# Patient Record
Sex: Female | Born: 1945
Health system: Southern US, Community
[De-identification: ages and names within clinical notes are randomized; demographics above are authoritative.]

## PROBLEM LIST (undated history)

## (undated) DIAGNOSIS — R011 Cardiac murmur, unspecified: Secondary | ICD-10-CM

## (undated) DIAGNOSIS — D509 Iron deficiency anemia, unspecified: Secondary | ICD-10-CM

## (undated) DIAGNOSIS — I10 Essential (primary) hypertension: Secondary | ICD-10-CM

## (undated) DIAGNOSIS — H269 Unspecified cataract: Secondary | ICD-10-CM

## (undated) DIAGNOSIS — T7840XA Allergy, unspecified, initial encounter: Secondary | ICD-10-CM

## (undated) DIAGNOSIS — C539 Malignant neoplasm of cervix uteri, unspecified: Secondary | ICD-10-CM

## (undated) DIAGNOSIS — M4313 Spondylolisthesis, cervicothoracic region: Secondary | ICD-10-CM

## (undated) DIAGNOSIS — M542 Cervicalgia: Secondary | ICD-10-CM

## (undated) DIAGNOSIS — Z87410 Personal history of cervical dysplasia: Secondary | ICD-10-CM

## (undated) DIAGNOSIS — G8929 Other chronic pain: Secondary | ICD-10-CM

## (undated) DIAGNOSIS — M1990 Unspecified osteoarthritis, unspecified site: Secondary | ICD-10-CM

## (undated) DIAGNOSIS — R079 Chest pain, unspecified: Secondary | ICD-10-CM

## (undated) DIAGNOSIS — K08109 Complete loss of teeth, unspecified cause, unspecified class: Secondary | ICD-10-CM

## (undated) DIAGNOSIS — Z8619 Personal history of other infectious and parasitic diseases: Secondary | ICD-10-CM

## (undated) DIAGNOSIS — K219 Gastro-esophageal reflux disease without esophagitis: Secondary | ICD-10-CM

## (undated) DIAGNOSIS — H04123 Dry eye syndrome of bilateral lacrimal glands: Secondary | ICD-10-CM

## (undated) DIAGNOSIS — N183 Chronic kidney disease, stage 3 unspecified: Secondary | ICD-10-CM

## (undated) DIAGNOSIS — I70213 Atherosclerosis of native arteries of extremities with intermittent claudication, bilateral legs: Secondary | ICD-10-CM

## (undated) DIAGNOSIS — M47812 Spondylosis without myelopathy or radiculopathy, cervical region: Secondary | ICD-10-CM

## (undated) DIAGNOSIS — Z86010 Personal history of colonic polyps: Secondary | ICD-10-CM

## (undated) DIAGNOSIS — N950 Postmenopausal bleeding: Secondary | ICD-10-CM

## (undated) DIAGNOSIS — E785 Hyperlipidemia, unspecified: Secondary | ICD-10-CM

## (undated) HISTORY — PX: HEMORRHOID SURGERY: SHX153

## (undated) HISTORY — DX: Chest pain, unspecified: R07.9

## (undated) HISTORY — PX: ORIF METATARSAL FRACTURE: SUR942

## (undated) HISTORY — DX: Essential (primary) hypertension: I10

## (undated) HISTORY — DX: Personal history of colonic polyps: Z86.010

## (undated) HISTORY — DX: Allergy, unspecified, initial encounter: T78.40XA

## (undated) HISTORY — DX: Dry eye syndrome of bilateral lacrimal glands: H04.123

## (undated) HISTORY — DX: Spondylosis without myelopathy or radiculopathy, cervical region: M47.812

## (undated) HISTORY — DX: Unspecified cataract: H26.9

## (undated) HISTORY — DX: Unspecified osteoarthritis, unspecified site: M19.90

## (undated) HISTORY — PX: FOOT SURGERY: SHX648

## (undated) HISTORY — DX: Gastro-esophageal reflux disease without esophagitis: K21.9

## (undated) HISTORY — PX: TUBAL LIGATION: SHX77

---

## 1999-03-20 ENCOUNTER — Encounter: Payer: Self-pay | Admitting: Pulmonary Disease

## 1999-03-20 ENCOUNTER — Ambulatory Visit (HOSPITAL_COMMUNITY): Admission: RE | Admit: 1999-03-20 | Discharge: 1999-03-20 | Payer: Self-pay | Admitting: Pulmonary Disease

## 2005-08-03 ENCOUNTER — Ambulatory Visit: Payer: Self-pay | Admitting: Nurse Practitioner

## 2005-08-03 ENCOUNTER — Ambulatory Visit: Payer: Self-pay | Admitting: *Deleted

## 2005-08-05 ENCOUNTER — Ambulatory Visit (HOSPITAL_COMMUNITY): Admission: RE | Admit: 2005-08-05 | Discharge: 2005-08-05 | Payer: Self-pay | Admitting: Nurse Practitioner

## 2005-08-19 ENCOUNTER — Ambulatory Visit: Payer: Self-pay | Admitting: Nurse Practitioner

## 2005-08-27 ENCOUNTER — Ambulatory Visit (HOSPITAL_COMMUNITY): Admission: RE | Admit: 2005-08-27 | Discharge: 2005-08-27 | Payer: Self-pay | Admitting: Internal Medicine

## 2006-08-23 ENCOUNTER — Emergency Department (HOSPITAL_COMMUNITY): Admission: EM | Admit: 2006-08-23 | Discharge: 2006-08-23 | Payer: Self-pay | Admitting: Emergency Medicine

## 2006-09-05 ENCOUNTER — Ambulatory Visit: Payer: Self-pay | Admitting: Nurse Practitioner

## 2006-09-08 ENCOUNTER — Ambulatory Visit (HOSPITAL_COMMUNITY): Admission: RE | Admit: 2006-09-08 | Discharge: 2006-09-08 | Payer: Self-pay | Admitting: Nurse Practitioner

## 2006-09-08 ENCOUNTER — Ambulatory Visit (HOSPITAL_COMMUNITY): Admission: RE | Admit: 2006-09-08 | Discharge: 2006-09-08 | Payer: Self-pay | Admitting: Family Medicine

## 2007-03-13 ENCOUNTER — Ambulatory Visit: Payer: Self-pay | Admitting: Nurse Practitioner

## 2007-09-06 ENCOUNTER — Encounter (INDEPENDENT_AMBULATORY_CARE_PROVIDER_SITE_OTHER): Payer: Self-pay | Admitting: *Deleted

## 2007-09-15 ENCOUNTER — Ambulatory Visit (HOSPITAL_COMMUNITY): Admission: RE | Admit: 2007-09-15 | Discharge: 2007-09-15 | Payer: Self-pay | Admitting: Nurse Practitioner

## 2008-06-12 ENCOUNTER — Ambulatory Visit: Payer: Self-pay | Admitting: Obstetrics and Gynecology

## 2008-06-12 ENCOUNTER — Other Ambulatory Visit: Admission: RE | Admit: 2008-06-12 | Discharge: 2008-06-12 | Payer: Self-pay | Admitting: Obstetrics and Gynecology

## 2011-05-04 NOTE — Group Therapy Note (Signed)
NAME:  Anita [name], Anita [name] NO.:  [id number]   MEDICAL RECORD NO.:  [id number]          PATIENT TYPE:  WOC   LOCATION:  [facility]                   FACILITY:  [facility]   PHYSICIAN:  Argentina Donovan, MD        DATE OF BIRTH:  09-26-1946   DATE OF SERVICE:  06/11/2008                                  CLINIC NOTE   The patient is a 65 year old African American female referred from the  [facility] Department after she had a colposcopy showing cervical biopsy of  low grade squamous intraepithelial lesion associated with detached  fragments concerning for high-grade squamous intraepithelial lesion, in  the endocervical curettings detached fragments of dysplastic squamous  mucosa.  The patient came for evaluation and examination.  The cervix  was atrophic and flat against the apex of the vagina but looked  completely normal.  I took a brush and did an endometrial smear and  endocervical smear, and if that does not show any atypical cells, I  think that I would go ahead and do cryosurgery on this patient and then  repeat her Pap smear in 6 months.  If, however, it shows positive cells  in the endocervical canal, I think that a LEEP procedure would be  indicated.   IMPRESSION:  Atypical cervical cytology L SIL, possible high-grade   I will call the patient with the results when they come in.           ______________________________  Argentina Donovan, MD     PR/MEDQ  D:  06/12/2008  T:  06/12/2008  Job:  [id number]

## 2011-06-04 ENCOUNTER — Emergency Department (HOSPITAL_COMMUNITY): Payer: Self-pay

## 2011-06-04 ENCOUNTER — Observation Stay (HOSPITAL_COMMUNITY)
Admission: EM | Admit: 2011-06-04 | Discharge: 2011-06-05 | Disposition: A | Discharge status: Home / Self Care | Payer: Self-pay | Attending: Orthopedic Surgery | Admitting: Orthopedic Surgery

## 2011-06-04 DIAGNOSIS — Y92009 Unspecified place in unspecified non-institutional (private) residence as the place of occurrence of the external cause: Secondary | ICD-10-CM | POA: Insufficient documentation

## 2011-06-04 DIAGNOSIS — Y998 Other external cause status: Secondary | ICD-10-CM | POA: Insufficient documentation

## 2011-06-04 DIAGNOSIS — Z01812 Encounter for preprocedural laboratory examination: Secondary | ICD-10-CM | POA: Insufficient documentation

## 2011-06-04 DIAGNOSIS — X500XXA Overexertion from strenuous movement or load, initial encounter: Secondary | ICD-10-CM | POA: Insufficient documentation

## 2011-06-04 DIAGNOSIS — Y9301 Activity, walking, marching and hiking: Secondary | ICD-10-CM | POA: Insufficient documentation

## 2011-06-04 DIAGNOSIS — S92309A Fracture of unspecified metatarsal bone(s), unspecified foot, initial encounter for closed fracture: Principal | ICD-10-CM | POA: Insufficient documentation

## 2011-06-04 LAB — CBC
HCT: 31.4 % — ABNORMAL LOW (ref 36.0–46.0)
MCH: 31.5 pg (ref 26.0–34.0)
MCV: 85.3 fL (ref 78.0–100.0)
Platelets: 174 10*3/uL (ref 150–400)
RDW: 18.5 % — ABNORMAL HIGH (ref 11.5–15.5)

## 2011-06-04 LAB — BASIC METABOLIC PANEL
BUN: 11 mg/dL (ref 6–23)
Calcium: 9.3 mg/dL (ref 8.4–10.5)
Creatinine, Ser: 0.54 mg/dL (ref 0.50–1.10)
GFR calc Af Amer: >60 mL/min (ref >60)

## 2011-06-10 NOTE — H&P (Signed)
Anita, [name] NO.:  [id number]  MEDICAL RECORD NO.:  [id number]  LOCATION:  5024                         FACILITY:  [facility]  PHYSICIAN:  Toni Arthurs, MD        DATE OF BIRTH:  [birth year]  DATE OF ADMISSION:  06/04/2011 DATE OF DISCHARGE:  06/04/2011                             HISTORY & PHYSICAL   ADMISSION DIAGNOSIS:  Left foot Lisfranc injury, left second, third, fourth and fifth metatarsal fractures.  HISTORY OF PRESENT ILLNESS:  The patient is a 65 year old woman with a past medical history significant for cigarette smoking.  She twisted her foot on a gumball while walking 2 days ago.  She was able to bear weight up through yesterday and then said the pain and swelling in her foot had become too severe.  She says she has been keeping it elevated and staying off her foot.  She denies any previous injury or surgery to that foot in the past.  She complains of severe pain in the mid foot that is worse with any attempt at standing and feels better when she stays off of it and keeps it elevated.  She is not diabetic and is not hypothyroid.  PAST MEDICAL HISTORY:  None.  PAST SURGICAL HISTORY:  Hemorrhoidectomy.  SOCIAL HISTORY:  The patient lives with her mother.  She is unemployed. She smokes a pack of cigarettes a day.  She denies any alcohol or drug use.  FAMILY HISTORY:  Positive for coronary artery disease in her mother and brother.  Hypertension in several family members.  REVIEW OF SYSTEMS:  No recent fever, chills, nausea, vomiting or changes in her appetite.  Review of systems as above and otherwise negative.  PHYSICAL EXAM:  The patient is a thin, generally healthy-appearing woman, in no apparent distress.  She is alert and oriented x4.  Mood and affect normal.  Extraocular motions are intact.  Respirations are unlabored.  Her gait is nonweightbearing on the left lower extremity. The left foot is somewhat swollen, but the skin still  wrinkles. Dorsalis pedis and posterior tibial pulses are 2+.  She feels light touch normally in the deep, superficial, peroneal nerve distributions, as well as the sural and saphenous nerve distributions, and the medial and lateral plantar nerve distributions.  She has tenderness to palpation over the midfoot.  There is no gross deformity noted.  The foot has active strength in dorsiflexion and plantar flexion of the ankle and the toes.  The foot has no lymphadenopathy.  X-RAYS:  Three views in the left foot were obtained.  These show displaced fractures of the third and fifth metatarsal shafts.  The second and fourth metatarsal bases have nondisplaced fractures that are extra-articular.  The first tarsometatarsal joint has slight displacement with the metatarsal base translated laterally.  There are small avulsion fractures adjacent to the joint capsule at the base of the first metatarsal medially and laterally.  There is no evidence of placement at the Lisfranc joint.  ASSESSMENT: 1. Left foot Lisfranc injury. 2. Left second metatarsal shaft fracture. 3. Left third metatarsal shaft fracture. 4. Left fourth metatarsal shaft fracture. 5. Left fifth metatarsal shaft fracture.  PLAN:  I explained the nature of these injuries to the patient in detail.  Based on the pattern of injury and the apparent instability at the first tarsometatarsal joint, I believe this is best treated operatively.  I explained the risks and benefits of nonoperative treatment as well as operative treatment in detail to the patient and her family members.  Their questions were answered to their satisfaction.  She understands specifically risks of bleeding, infection, nerve damage, blood clots, need for additional surgery, amputation, and death.  She would not proceed with surgical treatment. This will include closed reduction, percutaneous pinning, versus ORIF of her third and fifth metatarsal.  Her first  tarsometatarsal joint rather will be stressed.  If it can be reduced, closed, and will treat it closed otherwise, she will need ORIF of the first tarsometatarsal joint.     Toni Arthurs, MD     JH/MEDQ  D:  06/04/2011  T:  06/05/2011  Job:  [id number]  Electronically Signed by Toni Arthurs  on 06/10/2011 12:17:10 PM

## 2011-06-10 NOTE — Op Note (Signed)
Anita [name], Anita [name] NO.:  [id number]  MEDICAL RECORD NO.:  [id number]  LOCATION:  5024                         FACILITY:  [facility]  PHYSICIAN:  Toni Arthurs, MD        DATE OF BIRTH:  06-May-1946  DATE OF PROCEDURE:  06/04/2011 DATE OF DISCHARGE:  06/04/2011                              OPERATIVE REPORT   PREOPERATIVE DIAGNOSIS:  Left foot, first through fifth metatarsal fractures, possible Lisfranc fracture dislocation.  POSTOPERATIVE DIAGNOSIS:  Left foot, first through fifth metatarsal fractures.  PROCEDURE: 1. Open reduction and internal fixation of left foot third and fifth     metatarsal shaft fractures. 2. Intraoperative stress examination of the left foot under     fluoroscopy. 3. Intraoperative interpretation of fluoroscopic images. 4. Closed treatment of left foot first, second, and fourth metatarsal     fractures.  SURGEON:  Toni Arthurs, MD  ANESTHESIA:  General.  IV FLUIDS:  See anesthesia record.  ESTIMATED BLOOD LOSS:  Minimal.  TOURNIQUET TIME:  43 minutes at 250 mmHg.  COMPLICATIONS:  None apparent.  DISPOSITION:  Extubated, awake and stable to recovery.  INDICATIONS FOR PROCEDURE:  The patient is a 65 year old woman who sustained a twisting injury to her left foot approximately 3 days prior to admission.  Her x-rays showed displaced metatarsal shaft fractures #3 and #5.  She was also noted to have fractures of the base of the first, second, and fourth metatarsals.  This injury pattern was suggestive of a Lisfranc fracture dislocation.  She presents now for closed reduction and percutaneous pinning versus open reduction internal fixation of her third and fifth displaced metatarsal shaft fractures, and stress examination of her foot under fluoroscopy.  She may need open treatment of her Lisfranc fracture dislocation.  She understands the risks and benefits of this procedure and would like to proceed.  She also understands the risks  and benefits of the alternative treatment options. She specifically understands risks of bleeding, infection, nerve damage, blood clots, need for additional surgery, amputation and death.  PROCEDURE IN DETAIL:  After preoperative consent was obtained and the correct operative site was identified, the patient was brought to the operating room and placed supine on the operating table.  General anesthesia was induced.  Preoperative antibiotics were administered. Surgical time-out was taken, left lower extremity was prepped and draped in standard sterile fashion with the tourniquet around the thigh.  The patient's fifth metatarsal shaft fracture was identified.  A 0.0625 K- wire was inserted through the sole of foot at the metatarsal head holding the toe dorsiflexed.  The pin was advanced up the shaft of the distal fragment.  Several attempts at closed reduction were made under fluoroscopic guidance.  These were unsuccessful in reducing the fracture.  A longitudinal incision was made over the fracture site. Blunt dissection was carried down through the subcutaneous tissue.  The fracture site was identified.  The fracture was reduced.  The pin was driven across the fracture site and into the base of the fifth metatarsal.  AP and lateral views showed appropriate reduction of fracture and appropriate position of the pin.  This was repeated for the third metatarsal.  The  AP fluoroscopic image was then obtained. Adduction and abduction stress were then applied under live fluoroscopy. The Lisfranc joint was noted to be stable.  A lateral x-ray was obtained.  Plantar flexion stress was then applied and again, the Lisfranc joint was noted to be stable.  Both pins were then bent and trimmed.  Final AP, lateral, and oblique films had been obtained showing appropriate reduction of both fractures and appropriate position and length of the pins.  The wounds were irrigated copiously.  Inverted simple  sutures of 3-0 Monocryl were used to close subcutaneous tissue. Vertical mattress sutures of 4-0 nylon were used close the skin incision.  Sterile dressings were applied followed by a well-padded short-leg splint.  The tourniquet was released at 43 minutes.  The patient was then awakened by anesthesia and transported to the recovery room in stable condition.  FOLLOWUP PLAN:  The patient will be observed overnight for pain control. She will be discharged to home.  She will follow up with me in 2 weeks for suture removal and conversion to a cast.     Toni Arthurs, MD     JH/MEDQ  D:  06/04/2011  T:  06/05/2011  Job:  [id number]  Electronically Signed by Jonny Ruiz Lennart Gladish  on 06/10/2011 12:17:22 PM

## 2014-08-20 ENCOUNTER — Ambulatory Visit: Payer: Medicare HMO | Admitting: Physician Assistant

## 2014-10-04 ENCOUNTER — Emergency Department (INDEPENDENT_AMBULATORY_CARE_PROVIDER_SITE_OTHER)
Admission: EM | Admit: 2014-10-04 | Discharge: 2014-10-04 | Disposition: A | Discharge status: Home / Self Care | Payer: Medicare HMO | Source: Home / Self Care | Attending: Family Medicine | Admitting: Family Medicine

## 2014-10-04 ENCOUNTER — Encounter (HOSPITAL_COMMUNITY): Payer: Self-pay | Admitting: Emergency Medicine

## 2014-10-04 DIAGNOSIS — G8929 Other chronic pain: Secondary | ICD-10-CM

## 2014-10-04 DIAGNOSIS — M549 Dorsalgia, unspecified: Secondary | ICD-10-CM

## 2014-10-04 MED ORDER — DICLOFENAC SODIUM 50 MG PO TBEC: 50.0000 mg | DELAYED_RELEASE_TABLET | Freq: Two times a day (BID) | ORAL | Status: DC

## 2014-10-04 NOTE — ED Provider Notes (Signed)
CSN: [id number]     Arrival date & time 10/04/14  1226 History   None    Chief Complaint  Patient presents with  . Back Pain   (Consider location/radiation/quality/duration/timing/severity/associated sxs/prior Treatment) HPI Comments: Patient presents with a one year history of chronic, intermittent thoracic back pain. State current exacerbation is worse on the right side of her back. States pain increased 3 days ago. Denies recent illness or injury. Is a smoker. Denies bowel or bladder issues. Denies changes in strength or sensation. Using Aleve at home with some relief. No rash. PCP: Dr. Doug Sou at [facility]  Patient is a 68 y.o. female presenting with back pain. The history is provided by the patient.  Back Pain   History reviewed. No pertinent past medical history. History reviewed. No pertinent past surgical history. No family history on file. History  Substance Use Topics  . Smoking status: Current Every Day Smoker -- 1.00 packs/day    Types: Cigarettes  . Smokeless tobacco: Not on file  . Alcohol Use: Yes   OB History   Grav Para Term Preterm Abortions TAB SAB Ect Mult Living                 Review of Systems  Musculoskeletal: Positive for back pain.  All other systems reviewed and are negative.   Allergies  Review of patient's allergies indicates no known allergies.  Home Medications   Prior to Admission medications   Medication Sig Start Date End Date Taking? Authorizing Provider  diclofenac (VOLTAREN) 50 MG EC tablet Take 1 tablet (50 mg total) by mouth 2 (two) times daily. As needed for pain 10/04/14   Annett Gula H Iaan Oregel, PA   BP 145/82  Pulse 76  Temp(Src) 97.7 F (36.5 C) (Oral)  Resp 14  SpO2 99% Physical Exam  Nursing note and vitals reviewed. Constitutional: She is oriented to person, place, and time. She appears well-developed and well-nourished.  HENT:  Head: Normocephalic and atraumatic.  Eyes: Conjunctivae are normal. No scleral  icterus.  Cardiovascular: Normal rate, regular rhythm and normal heart sounds.   Pulmonary/Chest: Effort normal and breath sounds normal. No respiratory distress. She has no wheezes.  Abdominal: Soft. Normal appearance and bowel sounds are normal. She exhibits no distension. There is no tenderness. There is no rigidity, no rebound, no guarding and no CVA tenderness.  Musculoskeletal: Normal range of motion. She exhibits no edema.       Thoracic back: She exhibits tenderness. She exhibits normal range of motion, no bony tenderness, no swelling, no edema, no deformity, no laceration, no pain, no spasm and normal pulse.  +moderate kyphosis of thoracic spine without bony midline tenderness +mild tenderness to palapation of soft tissues near lower pole of scapula without visible skin changes or deformity  Neurological: She is alert and oriented to person, place, and time.  Skin: Skin is warm and dry. No rash noted. No erythema.  Psychiatric: She has a normal mood and affect. Her behavior is normal.    ED Course  Procedures (including critical care time) Labs Review Labs Reviewed - No data to display  Imaging Review No results found.   MDM   1. Chronic back pain   Mild acute exacerbation of chronic thoracic back pain I suspect is related to moderate thoracic kyphosis. Exam without suggestion of compression fracture or acute neurovascular or neuromuscular deficit. Switch to Voltaren as directed and follow up with PCP or ortho if symptoms worsen.     Annett Gula  Renelda Loma, PA 10/05/14 1308

## 2014-10-04 NOTE — Discharge Instructions (Signed)

## 2014-10-04 NOTE — ED Notes (Signed)
C/o upper back pain onset 2 days States it "feels like muscle spasm"; d/c increases w/activity Denies inj/trauma Alert, no signs of acute distress.

## 2014-10-09 NOTE — ED Provider Notes (Signed)
Medical screening examination/treatment/procedure(s) were performed by resident physician or non-physician practitioner and as supervising physician I was immediately available for consultation/collaboration.   Trust Leh DOUGLAS MD.   Emmons Toth D Yamen Castrogiovanni, MD 10/09/14 2004 

## 2014-12-06 ENCOUNTER — Encounter: Payer: Self-pay | Admitting: Internal Medicine

## 2014-12-06 ENCOUNTER — Other Ambulatory Visit (INDEPENDENT_AMBULATORY_CARE_PROVIDER_SITE_OTHER): Payer: Medicare HMO

## 2014-12-06 ENCOUNTER — Ambulatory Visit (INDEPENDENT_AMBULATORY_CARE_PROVIDER_SITE_OTHER): Payer: Medicare HMO | Admitting: Internal Medicine

## 2014-12-06 ENCOUNTER — Ambulatory Visit (INDEPENDENT_AMBULATORY_CARE_PROVIDER_SITE_OTHER): Payer: Medicare HMO | Admitting: Geriatric Medicine

## 2014-12-06 VITALS — BP 160/84 | HR 96 | Temp 98.4°F | Resp 20 | Ht 62.0 in | Wt 120.8 lb

## 2014-12-06 DIAGNOSIS — Z23 Encounter for immunization: Secondary | ICD-10-CM

## 2014-12-06 DIAGNOSIS — Z0000 Encounter for general adult medical examination without abnormal findings: Secondary | ICD-10-CM | POA: Insufficient documentation

## 2014-12-06 DIAGNOSIS — Z1239 Encounter for other screening for malignant neoplasm of breast: Secondary | ICD-10-CM

## 2014-12-06 DIAGNOSIS — Z1211 Encounter for screening for malignant neoplasm of colon: Secondary | ICD-10-CM

## 2014-12-06 DIAGNOSIS — Z418 Encounter for other procedures for purposes other than remedying health state: Secondary | ICD-10-CM

## 2014-12-06 DIAGNOSIS — M549 Dorsalgia, unspecified: Secondary | ICD-10-CM | POA: Insufficient documentation

## 2014-12-06 DIAGNOSIS — M545 Low back pain, unspecified: Secondary | ICD-10-CM

## 2014-12-06 DIAGNOSIS — Z299 Encounter for prophylactic measures, unspecified: Secondary | ICD-10-CM

## 2014-12-06 LAB — COMPREHENSIVE METABOLIC PANEL
ALBUMIN: 3.7 g/dL (ref 3.5–5.2)
ALK PHOS: 73 U/L (ref 39–117)
ALT: 38 U/L — ABNORMAL HIGH (ref 0–35)
AST: 60 U/L — ABNORMAL HIGH (ref 0–37)
BUN: 19 mg/dL (ref 6–23)
CO2: 22 mEq/L (ref 19–32)
Calcium: 9.3 mg/dL (ref 8.4–10.5)
Chloride: 105 mEq/L (ref 96–112)
Creatinine, Ser: 0.9 mg/dL (ref 0.4–1.2)
GFR: 85.98 mL/min (ref >60.00)
GLUCOSE: 80 mg/dL (ref 70–99)
POTASSIUM: 3.5 meq/L (ref 3.5–5.1)
SODIUM: 137 meq/L (ref 135–145)
TOTAL PROTEIN: 8.1 g/dL (ref 6.0–8.3)
Total Bilirubin: 0.5 mg/dL (ref 0.2–1.2)

## 2014-12-06 LAB — LIPID PANEL
CHOL/HDL RATIO: 3
CHOLESTEROL: 213 mg/dL — AB (ref 0–200)
HDL: 71.2 mg/dL (ref >39.00)
LDL CALC: 117 mg/dL — AB (ref 0–99)
NonHDL: 141.8
Triglycerides: 124 mg/dL (ref 0.0–149.0)
VLDL: 24.8 mg/dL (ref 0.0–40.0)

## 2014-12-06 LAB — HEMOGLOBIN A1C: Hgb A1c MFr Bld: 4.7 % (ref 4.6–6.5)

## 2014-12-06 NOTE — Progress Notes (Signed)
Pre visit review using our clinic review tool, if applicable. No additional management support is needed unless otherwise documented below in the visit note. 

## 2014-12-06 NOTE — Assessment & Plan Note (Signed)
Doing well with NSAIDs and likely muscular in origin. No further workup indicated at this time.

## 2014-12-06 NOTE — Assessment & Plan Note (Signed)
Needs mammogram, colonoscopy. Order and referral placed today. Flu and pneumonia shot given today. Will give Tdap next time.

## 2014-12-06 NOTE — Progress Notes (Signed)
   Subjective:    Patient ID: Anita [name], female    DOB: December 07, 1946, 68 y.o.   MRN: [id number]  HPI The patient is a 68 year old woman who comes in today to establish care. She does have no known past medical history. She has been seen at the ER several times in the last several months for low back pain. She was given Voltaren which she states has helped moderately. She denies any family medical history. She has not been to see a doctor in some time. Her mammogram is not up to date, she's never had colon cancer screening.  Review of Systems  Constitutional: Negative for fever, activity change, appetite change, fatigue and unexpected weight change.  HENT: Negative.   Respiratory: Negative for cough, chest tightness, shortness of breath and wheezing.   Cardiovascular: Negative for chest pain, palpitations and leg swelling.  Gastrointestinal: Negative for nausea, abdominal pain, diarrhea, constipation and abdominal distention.  Musculoskeletal: Negative.   Skin: Negative.   Neurological: Negative.   Psychiatric/Behavioral: Negative.       Objective:   Physical Exam  Constitutional: She is oriented to person, place, and time. She appears well-developed and well-nourished.  HENT:  Head: Normocephalic and atraumatic.  Eyes: EOM are normal.  Neck: Normal range of motion.  Cardiovascular: Normal rate and regular rhythm.   Pulmonary/Chest: Effort normal and breath sounds normal. No respiratory distress. She has no wheezes. She has no rales.  Abdominal: Soft. Bowel sounds are normal. She exhibits no distension. There is no tenderness. There is no rebound.  Musculoskeletal: She exhibits no edema.  Neurological: She is alert and oriented to person, place, and time. Coordination normal.  Skin: Skin is warm and dry.   Filed Vitals:   12/06/14 0907  BP: 160/84  Pulse: 96  Temp: 98.4 F (36.9 C)  TempSrc: Oral  Resp: 20  Height: 5\' 2"  (1.575 m)  Weight: 120 lb 12.8 oz (54.795 kg)    SpO2: 99%      Assessment & Plan:

## 2014-12-06 NOTE — Patient Instructions (Signed)
We will send you for your mammogram as well as your colon cancer screening. You should hear back about both those things to get them scheduled.  We will check your blood work today and call you back with the results.  We would recommend that you stop smoking. Every day that you are still smoking your damaging your lungs. It is never too late to quit.  We will see you back next year for your physical. If you have any new problems or questions before then please feel free to call our office.  Colonoscopy A colonoscopy is an exam to look at your colon. This exam can help find lumps (tumors), growths (polyps), bleeding, and redness and puffiness (inflammation) in your colon.  BEFORE THE PROCEDURE  Ask your doctor about changing or stopping your regular medicines.  You may need to drink a large amount of a special liquid (oral bowel prep). You start drinking this the day before your procedure. It will cause you to have watery poop (stool). This cleans out your colon.  Do not eat or drink anything else once you have started the bowel prep, unless your doctor tells you it is safe to do so.  Make plans for someone to drive you home after the procedure. PROCEDURE  You will be given medicine to help you relax (sedative).  You will lie on your side with your knees bent.  A tube with a camera on the end is put in the opening of your butt (anus) and into your colon. Pictures are sent to a computer screen. Your doctor will look for anything that is not normal.  Your doctor may take a tissue sample (biopsy) from your colon to be looked at more closely.  The exam is finished when your doctor has viewed all of the colon. AFTER THE PROCEDURE  Do not drive for 24 hours after the exam.  You may have a small amount of blood in your poop. This is normal.  You may pass gas and have belly (abdominal) cramps. This is normal.  Ask when your test results will be ready. Make sure you get your test  results. Document Released: 01/08/2011 Document Revised: 12/11/2013 Document Reviewed: 08/13/2013 [facility] Patient Information 2015 [place], [place]. This information is not intended to replace advice given to you by your health care provider. Make sure you discuss any questions you have with your health care provider.   Smoking Cessation Quitting smoking is important to your health and has many advantages. However, it is not always easy to quit since nicotine is a very addictive drug. Oftentimes, people try 3 times or more before being able to quit. This document explains the best ways for you to prepare to quit smoking. Quitting takes hard work and a lot of effort, but you can do it. ADVANTAGES OF QUITTING SMOKING  You will live longer, feel better, and live better.  Your body will feel the impact of quitting smoking almost immediately.  Within 20 minutes, blood pressure decreases. Your pulse returns to its normal level.  After 8 hours, carbon monoxide levels in the blood return to normal. Your oxygen level increases.  After 24 hours, the chance of having a heart attack starts to decrease. Your breath, hair, and body stop smelling like smoke.  After 48 hours, damaged nerve endings begin to recover. Your sense of taste and smell improve.  After 72 hours, the body is virtually free of nicotine. Your bronchial tubes relax and breathing becomes easier.  After  2 to 12 weeks, lungs can hold more air. Exercise becomes easier and circulation improves.  The risk of having a heart attack, stroke, cancer, or lung disease is greatly reduced.  After 1 year, the risk of coronary heart disease is cut in half.  After 5 years, the risk of stroke falls to the same as a nonsmoker.  After 10 years, the risk of lung cancer is cut in half and the risk of other cancers decreases significantly.  After 15 years, the risk of coronary heart disease drops, usually to the level of a nonsmoker.  If you are  pregnant, quitting smoking will improve your chances of having a healthy baby.  The people you live with, especially any children, will be healthier.  You will have extra money to spend on things other than cigarettes. QUESTIONS TO THINK ABOUT BEFORE ATTEMPTING TO QUIT You may want to talk about your answers with your health care provider.  Why do you want to quit?  If you tried to quit in the past, what helped and what did not?  What will be the most difficult situations for you after you quit? How will you plan to handle them?  Who can help you through the tough times? Your family? Friends? A health care provider?  What pleasures do you get from smoking? What ways can you still get pleasure if you quit? Here are some questions to ask your health care provider:  How can you help me to be successful at quitting?  What medicine do you think would be best for me and how should I take it?  What should I do if I need more help?  What is smoking withdrawal like? How can I get information on withdrawal? GET READY  Set a quit date.  Change your environment by getting rid of all cigarettes, ashtrays, matches, and lighters in your home, car, or work. Do not let people smoke in your home.  Review your past attempts to quit. Think about what worked and what did not. GET SUPPORT AND ENCOURAGEMENT You have a better chance of being successful if you have help. You can get support in many ways.  Tell your family, friends, and coworkers that you are going to quit and need their support. Ask them not to smoke around you.  Get individual, group, or telephone counseling and support. Programs are available at [employer] and health centers. Call your local health department for information about programs in your area.  Spiritual beliefs and practices may help some smokers quit.  Download a "quit meter" on your computer to keep track of quit statistics, such as how long you have gone without  smoking, cigarettes not smoked, and money saved.  Get a self-help book about quitting smoking and staying off tobacco. [place] yourself from urges to smoke. Talk to someone, go for a walk, or occupy your time with a task.  Change your normal routine. Take a different route to work. Drink tea instead of coffee. Eat breakfast in a different place.  Reduce your stress. Take a hot bath, exercise, or read a book.  Plan something enjoyable to do every day. Reward yourself for not smoking.  Explore interactive web-based programs that specialize in helping you quit. GET MEDICINE AND USE IT CORRECTLY Medicines can help you stop smoking and decrease the urge to smoke. Combining medicine with the above behavioral methods and support can greatly increase your chances of successfully quitting smoking.  Nicotine replacement therapy helps deliver nicotine to your body without the negative effects and risks of smoking. Nicotine replacement therapy includes nicotine gum, lozenges, inhalers, nasal sprays, and skin patches. Some may be available over-the-counter and others require a prescription.  Antidepressant medicine helps people abstain from smoking, but how this works is unknown. This medicine is available by prescription.  Nicotinic receptor partial agonist medicine simulates the effect of nicotine in your brain. This medicine is available by prescription. Ask your health care provider for advice about which medicines to use and how to use them based on your health history. Your health care provider will tell you what side effects to look out for if you choose to be on a medicine or therapy. Carefully read the information on the package. Do not use any other product containing nicotine while using a nicotine replacement product.  RELAPSE OR DIFFICULT SITUATIONS Most relapses occur within the first 3 months after quitting. Do not be discouraged if you start smoking again.  Remember, most people try several times before finally quitting. You may have symptoms of withdrawal because your body is used to nicotine. You may crave cigarettes, be irritable, feel very hungry, cough often, get headaches, or have difficulty concentrating. The withdrawal symptoms are only temporary. They are strongest when you first quit, but they will go away within 10-14 days. To reduce the chances of relapse, try to:  Avoid drinking alcohol. Drinking lowers your chances of successfully quitting.  Reduce the amount of caffeine you consume. Once you quit smoking, the amount of caffeine in your body increases and can give you symptoms, such as a rapid heartbeat, sweating, and anxiety.  Avoid smokers because they can make you want to smoke.  Do not let weight gain distract you. Many smokers will gain weight when they quit, usually less than 10 pounds. Eat a healthy diet and stay active. You can always lose the weight gained after you quit.  Find ways to improve your mood other than smoking. FOR MORE INFORMATION  www.smokefree.gov  Document Released: 11/30/2001 Document Revised: 04/22/2014 Document Reviewed: 03/16/2012 [facility] Patient Information 2015 [place], [place]. This information is not intended to replace advice given to you by your health care provider. Make sure you discuss any questions you have with your health care provider.

## 2014-12-23 ENCOUNTER — Encounter: Payer: Self-pay | Admitting: Internal Medicine

## 2014-12-24 ENCOUNTER — Ambulatory Visit
Admission: RE | Admit: 2014-12-24 | Discharge: 2014-12-24 | Disposition: A | Discharge status: Home / Self Care | Payer: Medicare HMO | Source: Ambulatory Visit | Attending: Internal Medicine | Admitting: Internal Medicine

## 2014-12-24 DIAGNOSIS — Z1239 Encounter for other screening for malignant neoplasm of breast: Secondary | ICD-10-CM

## 2014-12-24 DIAGNOSIS — Z1231 Encounter for screening mammogram for malignant neoplasm of breast: Secondary | ICD-10-CM | POA: Diagnosis not present

## 2014-12-26 ENCOUNTER — Other Ambulatory Visit: Payer: Self-pay | Admitting: Internal Medicine

## 2014-12-26 DIAGNOSIS — R928 Other abnormal and inconclusive findings on diagnostic imaging of breast: Secondary | ICD-10-CM

## 2015-01-03 ENCOUNTER — Ambulatory Visit
Admission: RE | Admit: 2015-01-03 | Discharge: 2015-01-03 | Disposition: A | Discharge status: Home / Self Care | Payer: Medicare HMO | Source: Ambulatory Visit | Attending: Internal Medicine | Admitting: Internal Medicine

## 2015-01-03 DIAGNOSIS — R920 Mammographic microcalcification found on diagnostic imaging of breast: Secondary | ICD-10-CM | POA: Diagnosis not present

## 2015-01-03 DIAGNOSIS — R928 Other abnormal and inconclusive findings on diagnostic imaging of breast: Secondary | ICD-10-CM

## 2015-01-21 ENCOUNTER — Encounter: Payer: Self-pay | Admitting: Internal Medicine

## 2015-01-21 ENCOUNTER — Ambulatory Visit (INDEPENDENT_AMBULATORY_CARE_PROVIDER_SITE_OTHER): Payer: Commercial Managed Care - HMO | Admitting: Internal Medicine

## 2015-01-21 VITALS — BP 130/78 | HR 96 | Temp 97.9°F | Resp 12 | Ht 62.0 in | Wt 120.0 lb

## 2015-01-21 DIAGNOSIS — M545 Low back pain, unspecified: Secondary | ICD-10-CM

## 2015-01-21 MED ORDER — CYCLOBENZAPRINE HCL 5 MG PO TABS: 5.0000 mg | ORAL_TABLET | Freq: Two times a day (BID) | ORAL | Status: DC | PRN

## 2015-01-21 NOTE — Progress Notes (Signed)
Pre visit review using our clinic review tool, if applicable. No additional management support is needed unless otherwise documented below in the visit note. 

## 2015-01-21 NOTE — Patient Instructions (Signed)
We have sent in a strong muscle relaxer for your back which you can take 1 pill up to 2 times a day.   The other thing we think you should try is a heating pad on the back which may help a lot more.   Back Pain, Adult Back pain is very common. The pain often gets better over time. The cause of back pain is usually not dangerous. Most people can learn to manage their back pain on their own.  HOME CARE   Stay active. Start with short walks on flat ground if you can. Try to walk farther each day.  Do not sit, drive, or stand in one place for more than 30 minutes. Do not stay in bed.  Do not avoid exercise or work. Activity can help your back heal faster.  Be careful when you bend or lift an object. Bend at your knees, keep the object close to you, and do not twist.  Sleep on a firm mattress. Lie on your side, and bend your knees. If you lie on your back, put a pillow under your knees.  Only take medicines as told by your doctor.  Put ice on the injured area.  Put ice in a plastic bag.  Place a towel between your skin and the bag.  Leave the ice on for 15-20 minutes, 03-04 times a day for the first 2 to 3 days. After that, you can switch between ice and heat packs.  Ask your doctor about back exercises or massage.  Avoid feeling anxious or stressed. Find good ways to deal with stress, such as exercise. GET HELP RIGHT AWAY IF:   Your pain does not go away with rest or medicine.  Your pain does not go away in 1 week.  You have new problems.  You do not feel well.  The pain spreads into your legs.  You cannot control when you poop (bowel movement) or pee (urinate).  Your arms or legs feel weak or lose feeling (numbness).  You feel sick to your stomach (nauseous) or throw up (vomit).  You have belly (abdominal) pain.  You feel like you may pass out (faint). MAKE SURE YOU:   Understand these instructions.  Will watch your condition.  Will get help right away if you  are not doing well or get worse. Document Released: 05/24/2008 Document Revised: 02/28/2012 Document Reviewed: 04/09/2014 [facility] Patient Information 2015 [place], [place]. This information is not intended to replace advice given to you by your health care provider. Make sure you discuss any questions you have with your health care provider.

## 2015-01-23 NOTE — Progress Notes (Signed)
   Subjective:    Patient ID: Anita [name], female    DOB: [birth year], 69 y.o.   MRN: [id number]  HPI The patient is a 69 YO female who is coming in for back pain. It has been going on for 3 days and is constant throbbing pain. She denies injury or strain to the area. She has tried tylenol and ibuprofen which were not helpful. No associated numbness or pain in her legs. No nausea, fever, weight loss.   Review of Systems  Constitutional: Negative for fever, activity change, appetite change, fatigue and unexpected weight change.  HENT: Negative.   Respiratory: Negative for cough, chest tightness, shortness of breath and wheezing.   Cardiovascular: Negative for chest pain, palpitations and leg swelling.  Gastrointestinal: Negative for nausea, abdominal pain, diarrhea, constipation and abdominal distention.  Musculoskeletal: Positive for back pain.  Skin: Negative.   Neurological: Negative.   Psychiatric/Behavioral: Negative.       Objective:   Physical Exam  Constitutional: She is oriented to person, place, and time. She appears well-developed and well-nourished.  HENT:  Head: Normocephalic and atraumatic.  Eyes: EOM are normal.  Neck: Normal range of motion.  Cardiovascular: Normal rate and regular rhythm.   Pulmonary/Chest: Effort normal and breath sounds normal. No respiratory distress. She has no wheezes. She has no rales.  Abdominal: Soft. Bowel sounds are normal. She exhibits no distension. There is no tenderness. There is no rebound.  Musculoskeletal: She exhibits no edema.  Low back tenderness left paraspinal  Neurological: She is alert and oriented to person, place, and time. Coordination normal.  Skin: Skin is warm and dry.   Filed Vitals:   01/21/15 1503  BP: 130/78  Pulse: 96  Temp: 97.9 F (36.6 C)  TempSrc: Oral  Resp: 12  Height: 5\' 2"  (1.575 m)  Weight: 120 lb (54.432 kg)  SpO2: 99%      Assessment & Plan:

## 2015-01-23 NOTE — Assessment & Plan Note (Signed)
Muscular in origin and not doing well with NSAIDs last 3 days. Likely strain. Given stretching exercises and will trial flexeril for the pain. No indication for imaging at this time.

## 2015-02-13 ENCOUNTER — Ambulatory Visit (AMBULATORY_SURGERY_CENTER): Payer: Self-pay | Admitting: *Deleted

## 2015-02-13 VITALS — Ht 62.0 in | Wt 122.0 lb

## 2015-02-13 DIAGNOSIS — Z1211 Encounter for screening for malignant neoplasm of colon: Secondary | ICD-10-CM

## 2015-02-13 NOTE — Progress Notes (Signed)
No egg or soy allergy.  No home 02 use No diet pills No issues with past sedation emmi video to email

## 2015-02-18 HISTORY — PX: COLONOSCOPY: SHX174

## 2015-02-27 ENCOUNTER — Ambulatory Visit (AMBULATORY_SURGERY_CENTER): Payer: Commercial Managed Care - HMO | Admitting: Internal Medicine

## 2015-02-27 ENCOUNTER — Encounter: Payer: Self-pay | Admitting: Internal Medicine

## 2015-02-27 VITALS — BP 133/63 | HR 86 | Temp 98.1°F | Resp 20 | Ht 62.0 in | Wt 122.0 lb

## 2015-02-27 DIAGNOSIS — D128 Benign neoplasm of rectum: Secondary | ICD-10-CM

## 2015-02-27 DIAGNOSIS — K635 Polyp of colon: Secondary | ICD-10-CM | POA: Diagnosis not present

## 2015-02-27 DIAGNOSIS — K621 Rectal polyp: Secondary | ICD-10-CM

## 2015-02-27 DIAGNOSIS — Z1211 Encounter for screening for malignant neoplasm of colon: Secondary | ICD-10-CM

## 2015-02-27 DIAGNOSIS — D124 Benign neoplasm of descending colon: Secondary | ICD-10-CM | POA: Diagnosis not present

## 2015-02-27 DIAGNOSIS — D125 Benign neoplasm of sigmoid colon: Secondary | ICD-10-CM

## 2015-02-27 MED ORDER — SODIUM CHLORIDE 0.9 % IV SOLN: 500.0000 mL | INTRAVENOUS | Status: DC

## 2015-02-27 NOTE — Progress Notes (Signed)
Called to room to assist during endoscopic procedure.  Patient ID and intended procedure confirmed with present staff. Received instructions for my participation in the procedure from the performing physician.  

## 2015-02-27 NOTE — Patient Instructions (Addendum)
I found and removed 6 polyps today. They were small and appeared benign.  I appreciate the opportunity to care for you.  Gatha Mayer, MD, FACG      YOU HAD AN ENDOSCOPIC PROCEDURE TODAY AT [facility]:   Refer to the procedure report that was given to you for any specific questions about what was found during the examination.  If the procedure report does not answer your questions, please call your gastroenterologist to clarify.  If you requested that your care partner not be given the details of your procedure findings, then the procedure report has been included in a sealed envelope for you to review at your convenience later.  YOU SHOULD EXPECT: Some feelings of bloating in the abdomen. Passage of more gas than usual.  Walking can help get rid of the air that was put into your GI tract during the procedure and reduce the bloating. If you had a lower endoscopy (such as a colonoscopy or flexible sigmoidoscopy) you may notice spotting of blood in your stool or on the toilet paper. If you underwent a bowel prep for your procedure, you may not have a normal bowel movement for a few days.  Please Note:  You might notice some irritation and congestion in your nose or some drainage.  This is from the oxygen used during your procedure.  There is no need for concern and it should clear up in a day or so.  SYMPTOMS TO REPORT IMMEDIATELY:   Following lower endoscopy (colonoscopy or flexible sigmoidoscopy):  Excessive amounts of blood in the stool  Significant tenderness or worsening of abdominal pains  Swelling of the abdomen that is new, acute  Fever of 100F or higher    For urgent or emergent issues, a gastroenterologist can be reached at any hour by calling [phone].   DIET: Your first meal following the procedure should be a small meal and then it is ok to progress to your normal diet. Heavy or fried foods are harder to digest and may make you feel  nauseous or bloated.  Likewise, meals heavy in dairy and vegetables can increase bloating.  Drink plenty of fluids but you should avoid alcoholic beverages for 24 hours.  ACTIVITY:  You should plan to take it easy for the rest of today and you should NOT DRIVE or use heavy machinery until tomorrow (because of the sedation medicines used during the test).    FOLLOW UP: Our staff will call the number listed on your records the next business day following your procedure to check on you and address any questions or concerns that you may have regarding the information given to you following your procedure. If we do not reach you, we will leave a message.  However, if you are feeling well and you are not experiencing any problems, there is no need to return our call.  We will assume that you have returned to your regular daily activities without incident.  If any biopsies were taken you will be contacted by phone or by letter within the next 1-3 weeks.  Please call us at [phone] if you have not heard about the biopsies in 3 weeks.    SIGNATURES/CONFIDENTIALITY: You and/or your care partner have signed paperwork which will be entered into your electronic medical record.  These signatures attest to the fact that that the information above on your After Visit Summary has been reviewed and is understood.  Full responsibility of the confidentiality  of this discharge information lies with you and/or your care-partner.   Resume medications.Information given on polyps with discharge instructions.

## 2015-02-27 NOTE — Op Note (Addendum)
[place]  [address] [place], [address]   COLONOSCOPY PROCEDURE REPORT  PATIENT: Anita [name], Anita [name]  MR#: [id number] BIRTHDATE: 1946/10/06 , 4  yrs. old GENDER: female ENDOSCOPIST: Gatha Mayer, MD, [facility] PROCEDURE DATE:  02/27/2015 PROCEDURE:   Colonoscopy, screening and Colonoscopy with snare polypectomy First Screening Colonoscopy - Avg.  risk and is 50 yrs.  old or older Yes.  Prior Negative Screening - Now for repeat screening. N/A  History of Adenoma - Now for follow-up colonoscopy & has been > or = to 3 yrs.  N/A  Polyps Removed Today ASA CLASS:   Class II INDICATIONS:Screening for colonic neoplasia and Average Risk. MEDICATIONS: Propofol 200 mg IV and Monitored anesthesia care  DESCRIPTION OF PROCEDURE:   After the risks benefits and alternatives of the procedure were thoroughly explained, informed consent was obtained.  The digital rectal exam revealed no rectal mass and revealed decreased sphincter tone.   The LB PCF Q180 [id number]  endoscope was introduced through the anus and advanced to the cecum, which was identified by both the appendix and ileocecal valve. No adverse events experienced.   The quality of the prep was good.  (MiraLax was used)  The instrument was then slowly withdrawn as the colon was fully examined.  COLON FINDINGS: Six sessile polyps ranging from 2 to 63mm in size were found in the rectum (4), sigmoid colon (1), and descending colon(1).  Polypectomies were performed with a cold snare.  The resection was complete, the polyp tissue was partially retrieved and sent to histology. 1 rectal and the sigmoid polyps not recovered.  The examination was otherwise normal.  Retroflexed views revealed no abnormalities. The time to cecum = 4.1 Withdrawal time = 16.8   The scope was withdrawn and the procedure completed. COMPLICATIONS: There were no immediate complications.  ENDOSCOPIC IMPRESSION: 1.   Six sessile polyps ranging from 2 to  51mm in size were found in the rectum, sigmoid colon, and descending colon; polypectomies were performed with a cold snare 1 rectal and the sigmoid polyp not recovered. 2.   The examination was otherwise normal  RECOMMENDATIONS: Timing of repeat colonoscopy will be determined by pathology findings.  eSigned:  Gatha Mayer, MD, [facility] 02/27/2015 9:12 AM Revised: 02/27/2015 9:12 AM  cc: The Patient and Vertell Novak, MD   PATIENT NAME:  Anita [name], Anita [name] MR#: [id number]

## 2015-02-27 NOTE — Progress Notes (Signed)
  [facility] Post-op Note  Patient: Anita [name]  Procedure(s) Performed: colonoscopy  Patient Location: [facility] - Recovery Area  Anesthesia Type: Deep Sedation/Propofol  Level of Consciousness: awake, oriented and patient cooperative  Airway and Oxygen Therapy: Patient Spontanous Breathing  Post-op Pain: none  Post-op Assessment:  Post-op Vital signs reviewed, Patient's Cardiovascular Status Stable, Respiratory Function Stable, Patent Airway, No signs of Nausea or vomiting and Pain level controlled  Post-op Vital Signs: Reviewed and stable  Complications: No apparent anesthesia complications  Amnah Breuer E 9:04 AM

## 2015-02-27 NOTE — Progress Notes (Signed)
Pt. Waiting for ride to arrive.

## 2015-02-28 ENCOUNTER — Telehealth: Payer: Self-pay | Admitting: *Deleted

## 2015-02-28 NOTE — Telephone Encounter (Signed)
  Follow up Call-  Call back number 02/27/2015  Post procedure Call Back phone  # [phone]  Permission to leave phone message Yes     Patient questions:  Do you have a fever, pain , or abdominal swelling? No. Pain Score  0 *  Have you tolerated food without any problems? Yes.    Have you been able to return to your normal activities? Yes.    Do you have any questions about your discharge instructions: Diet   No. Medications  No. Follow up visit  No.  Do you have questions or concerns about your Care? No.  Actions: * If pain score is 4 or above: No action needed, pain <4.

## 2015-03-06 ENCOUNTER — Encounter: Payer: Self-pay | Admitting: Internal Medicine

## 2015-03-06 DIAGNOSIS — Z86010 Personal history of colonic polyps: Secondary | ICD-10-CM

## 2015-03-06 DIAGNOSIS — Z860101 Personal history of adenomatous and serrated colon polyps: Secondary | ICD-10-CM

## 2015-03-06 HISTORY — DX: Personal history of colonic polyps: Z86.010

## 2015-03-06 HISTORY — DX: Personal history of adenomatous and serrated colon polyps: Z86.0101

## 2015-03-06 NOTE — Progress Notes (Signed)
Quick Note:  1 diminutive adenoma and distal hyperplastics Repeat colonoscopy 2021 ______

## 2015-04-02 ENCOUNTER — Other Ambulatory Visit: Payer: Self-pay | Admitting: Internal Medicine

## 2015-04-02 DIAGNOSIS — Z0000 Encounter for general adult medical examination without abnormal findings: Secondary | ICD-10-CM

## 2015-05-05 ENCOUNTER — Telehealth: Payer: Self-pay | Admitting: Internal Medicine

## 2015-05-05 DIAGNOSIS — Z0000 Encounter for general adult medical examination without abnormal findings: Secondary | ICD-10-CM

## 2015-05-05 NOTE — Telephone Encounter (Signed)
Pt called in and wanted to see if Dr Doug Sou would put in order to get a Dexa scan.    Best number [phone]

## 2015-05-05 NOTE — Telephone Encounter (Signed)
Patient aware that orders have been placed.

## 2015-05-05 NOTE — Telephone Encounter (Signed)
Order placed and she will hear back about scheduling.

## 2015-05-06 ENCOUNTER — Telehealth: Payer: Self-pay | Admitting: Internal Medicine

## 2015-05-06 NOTE — Telephone Encounter (Signed)
Patient needs referral for her catarac surgery. Dr. Corky Downs [facility]. Appt. Is for 7/19.

## 2015-05-13 NOTE — Telephone Encounter (Signed)
Anita [name] Anita [name]  #[id number] valid 04/07/2015 - 10/04/2015 for 6 visits

## 2015-06-04 ENCOUNTER — Ambulatory Visit (INDEPENDENT_AMBULATORY_CARE_PROVIDER_SITE_OTHER)
Admission: RE | Admit: 2015-06-04 | Discharge: 2015-06-04 | Disposition: A | Discharge status: Home / Self Care | Payer: Commercial Managed Care - HMO | Source: Ambulatory Visit | Attending: Internal Medicine | Admitting: Internal Medicine

## 2015-06-04 DIAGNOSIS — Z0000 Encounter for general adult medical examination without abnormal findings: Secondary | ICD-10-CM

## 2015-06-04 DIAGNOSIS — Z13820 Encounter for screening for osteoporosis: Secondary | ICD-10-CM | POA: Diagnosis not present

## 2015-06-10 ENCOUNTER — Telehealth: Payer: Self-pay | Admitting: Internal Medicine

## 2015-06-10 NOTE — Telephone Encounter (Signed)
Patient is returning your phone call. 

## 2015-06-11 NOTE — Telephone Encounter (Signed)
Spoke with patient.

## 2015-06-21 ENCOUNTER — Ambulatory Visit (INDEPENDENT_AMBULATORY_CARE_PROVIDER_SITE_OTHER): Payer: Commercial Managed Care - HMO | Admitting: Family Medicine

## 2015-06-21 ENCOUNTER — Encounter: Payer: Self-pay | Admitting: Family Medicine

## 2015-06-21 VITALS — BP 122/72 | HR 83 | Temp 97.8°F | Ht 62.0 in | Wt 122.2 lb

## 2015-06-21 DIAGNOSIS — M79604 Pain in right leg: Secondary | ICD-10-CM

## 2015-06-21 NOTE — Patient Instructions (Signed)
I would schedule tylenol 650 mg three times a day with meals and continue heating and elevation.   This may not be anything serious but you have an abnormal knee and hip exam and believe you need further evaluation.

## 2015-06-21 NOTE — Progress Notes (Signed)
PCP: Olga Millers, MD  Subjective:  Anita [name] is a 69 y.o. year old very pleasant female patient who presents right knee pain radiatingto upper inner thigh -States he had an old injury- tripped on water at work in mid 66s.Sh feels some stiffness with walking in knee and hip. Pain sitting at 2/10, walking 2/10, sit to stand 10/10. Only using a heating pad to ease pain. Has not tried anything other than hitting pad-worried about side effects  ROS- no fever, chills, no bulge in groin  Pertinent Past Medical History- history low back pain, smoker  Medications- reviewed, no rx  Objective: BP 122/72 mmHg  Pulse 83  Temp(Src) 97.8 F (36.6 C) (Oral)  Ht 5\' 2"  (1.575 m)  Wt 122 lb 4 oz (55.452 kg)  BMI 22.35 kg/m2  SpO2 99% Gen: NAD, resting comfortably CV: RRR no murmurs rubs or gallops Lungs: CTAB no crackles, wheeze, rhonchi  Knee: Normal to inspection with no erythema or effusion or obvious bony abnormalities. Palpation normal with no warmth, joint line tenderness, patellar tenderness, or condyle tenderness. ROM full in flexion and extension and lower leg rotation. Ligaments with solid consistent endpoints including ACL, PCL.  Some laxiity with lateral movements of lower leg.. When moving lower leg from medial to lateral position there is a recurrent clunking sensation and patient has mild to moderate pain with this.  Patient tenses too much for me to perform Mcmurrays despite repeated attempts Patella remains in normal tract with these movements.   Right Hip: ROM- limited external rotation of hip, normal internal rotation when compared with left Low back without pain to palpation  Ext: no edema   Assessment/Plan:  Right leg pain Knee with very atypical exam when moving from stressing LCL to MCL with a recurrent clunk sensation, also limited range of motion with external rotation of the hip. Her symptoms for the most part are mild but given abnormal exam, refer  to orthopedics for further evaluation. Would not be surprised if this was a lateral meniscus injury but on other hand, pain is on medial side of knee and up medial inner leg. Does have some pain into groin and may have some hip arthritis as well.   Return precautions advised.   Orders Placed This Encounter  Procedures  . AMB referral to orthopedics    Referral Priority:  Routine    Referral Type:  Consultation    Number of Visits Requested:  1

## 2015-06-21 NOTE — Progress Notes (Signed)
Pre visit review using our clinic review tool, if applicable. No additional management support is needed unless otherwise documented below in the visit note. 

## 2015-07-08 DIAGNOSIS — H2511 Age-related nuclear cataract, right eye: Secondary | ICD-10-CM | POA: Diagnosis not present

## 2015-07-08 DIAGNOSIS — H25011 Cortical age-related cataract, right eye: Secondary | ICD-10-CM | POA: Diagnosis not present

## 2015-07-08 DIAGNOSIS — H2512 Age-related nuclear cataract, left eye: Secondary | ICD-10-CM | POA: Diagnosis not present

## 2015-07-08 DIAGNOSIS — H25012 Cortical age-related cataract, left eye: Secondary | ICD-10-CM | POA: Diagnosis not present

## 2015-07-11 ENCOUNTER — Encounter: Payer: Self-pay | Admitting: Internal Medicine

## 2015-07-11 DIAGNOSIS — R928 Other abnormal and inconclusive findings on diagnostic imaging of breast: Secondary | ICD-10-CM

## 2015-08-07 ENCOUNTER — Encounter: Payer: Self-pay | Admitting: Internal Medicine

## 2015-08-07 MED ORDER — METHOCARBAMOL 500 MG PO TABS: 500.0000 mg | ORAL_TABLET | Freq: Three times a day (TID) | ORAL | Status: DC | PRN

## 2015-08-08 ENCOUNTER — Telehealth: Payer: Self-pay | Admitting: *Deleted

## 2015-08-08 MED ORDER — METHOCARBAMOL 500 MG PO TABS: 500.0000 mg | ORAL_TABLET | Freq: Three times a day (TID) | ORAL | Status: DC | PRN

## 2015-08-08 NOTE — Telephone Encounter (Signed)
Received call pt states md was suppose to send some medication in for her on yesterday pharmacy states they haven't receive. Inform ot md sent robaxin to [facility]. Pt states she ask for med to go to [address]. Inform pt will resend to [facility].../lmb

## 2015-08-18 DIAGNOSIS — M1711 Unilateral primary osteoarthritis, right knee: Secondary | ICD-10-CM | POA: Diagnosis not present

## 2015-08-18 DIAGNOSIS — M47816 Spondylosis without myelopathy or radiculopathy, lumbar region: Secondary | ICD-10-CM | POA: Diagnosis not present

## 2015-08-18 DIAGNOSIS — M160 Bilateral primary osteoarthritis of hip: Secondary | ICD-10-CM | POA: Diagnosis not present

## 2015-09-16 ENCOUNTER — Ambulatory Visit
Admission: RE | Admit: 2015-09-16 | Discharge: 2015-09-16 | Disposition: A | Discharge status: Home / Self Care | Payer: Commercial Managed Care - HMO | Source: Ambulatory Visit | Attending: Internal Medicine | Admitting: Internal Medicine

## 2015-09-16 DIAGNOSIS — R921 Mammographic calcification found on diagnostic imaging of breast: Secondary | ICD-10-CM | POA: Diagnosis not present

## 2015-09-16 DIAGNOSIS — R928 Other abnormal and inconclusive findings on diagnostic imaging of breast: Secondary | ICD-10-CM

## 2015-10-02 ENCOUNTER — Telehealth: Payer: Self-pay | Admitting: Internal Medicine

## 2015-10-02 DIAGNOSIS — H269 Unspecified cataract: Secondary | ICD-10-CM

## 2015-10-02 NOTE — Telephone Encounter (Signed)
Call and find out the name of the doctor for the referral

## 2015-10-02 NOTE — Telephone Encounter (Signed)
Referral placed to optho.

## 2015-10-02 NOTE — Telephone Encounter (Signed)
Pt called back and said that she does have specific doctor, just who is ever that is in network with [facility] and will pay for it. Please help

## 2015-10-02 NOTE — Telephone Encounter (Signed)
Left message for patient to call back  

## 2015-10-02 NOTE — Telephone Encounter (Signed)
Pt request referral to cataracts surgeon. she went to the optamology and they refer her out to the get cataracts surgery but it will be cheaper if Dr. Sharlet Salina put in the referral due to [facility]. Please advise.

## 2015-10-30 ENCOUNTER — Encounter: Payer: Self-pay | Admitting: Internal Medicine

## 2015-11-21 DIAGNOSIS — Z0100 Encounter for examination of eyes and vision without abnormal findings: Secondary | ICD-10-CM | POA: Diagnosis not present

## 2015-11-21 DIAGNOSIS — H25013 Cortical age-related cataract, bilateral: Secondary | ICD-10-CM | POA: Diagnosis not present

## 2015-11-21 DIAGNOSIS — H2513 Age-related nuclear cataract, bilateral: Secondary | ICD-10-CM | POA: Diagnosis not present

## 2015-11-26 ENCOUNTER — Encounter: Payer: Self-pay | Admitting: Internal Medicine

## 2015-11-26 DIAGNOSIS — M549 Dorsalgia, unspecified: Secondary | ICD-10-CM

## 2015-12-03 DIAGNOSIS — H25811 Combined forms of age-related cataract, right eye: Secondary | ICD-10-CM | POA: Diagnosis not present

## 2015-12-03 DIAGNOSIS — H25011 Cortical age-related cataract, right eye: Secondary | ICD-10-CM | POA: Diagnosis not present

## 2015-12-23 ENCOUNTER — Ambulatory Visit (INDEPENDENT_AMBULATORY_CARE_PROVIDER_SITE_OTHER): Payer: Commercial Managed Care - HMO | Admitting: Neurology

## 2015-12-23 ENCOUNTER — Encounter: Payer: Self-pay | Admitting: Neurology

## 2015-12-23 VITALS — BP 137/68 | HR 104 | Ht 62.0 in | Wt 119.0 lb

## 2015-12-23 DIAGNOSIS — M25512 Pain in left shoulder: Secondary | ICD-10-CM | POA: Diagnosis not present

## 2015-12-23 DIAGNOSIS — M25511 Pain in right shoulder: Secondary | ICD-10-CM

## 2015-12-23 DIAGNOSIS — M25519 Pain in unspecified shoulder: Secondary | ICD-10-CM | POA: Insufficient documentation

## 2015-12-23 MED ORDER — TIZANIDINE HCL 2 MG PO TABS: 2.0000 mg | ORAL_TABLET | Freq: Three times a day (TID) | ORAL | Status: DC | PRN

## 2015-12-23 NOTE — Progress Notes (Signed)
PATIENT: Anita [name] DOB: January 01, 1946  Chief Complaint  Patient presents with  . Thoracic Pain    Pain has been present for years but has recently worsened.  She tried muscle relaxers but they were not helpful.  Reports pain, tightness and burning in her scapular region on both sides.  Denies radiation to arms.  She has previously had an xray and she was told it was arthritis.   She has never had a MRI.     HISTORICAL  Anita [name] is a 70 years old right-handed female, seen in refer by  her primary care physician Dr. Pricilla Holm for evaluation of thoracic pain.  She complains of gradual onset intermittent bilateral shoulder pain, usually triggered by using her arms, her shoulder pain, intermittently involving her right, and left shoulder, located at upper trapezius level, not radiating to arms, no neck pain, no limitations on her activity when that happens, it happened every few months, it can last for couple days, she has tried a heating pad without helping her symptoms, she denies gait difficulty, denies bilateral upper extremity paresthesia or weakness.  She denies neck pain, no bowel and bladder incontinence   REVIEW OF SYSTEMS: Full 14 system review of systems performed and notable only for as above  ALLERGIES: No Known Allergies  HOME MEDICATIONS: Current Outpatient Prescriptions  Medication Sig Dispense Refill  . prednisoLONE acetate (PRED FORTE) 1 % ophthalmic suspension USE AS DIRECTED BY DOCTOR AFTER SURGERY  0   No current facility-administered medications for this visit.    PAST MEDICAL HISTORY: Past Medical History  Diagnosis Date  . Allergy   . Dry eyes   . Arthritis   . Cataract     bilateral  . Hx of adenomatous polyp of colon 03/06/2015    PAST SURGICAL HISTORY: Past Surgical History  Procedure Laterality Date  . Hemorrhoid surgery    . Tubal ligation    . Foot surgery      FAMILY HISTORY: Family History  Problem Relation Age of  Onset  . Hypertension Mother   . Arthritis Mother   . Colon cancer Neg Hx   . Esophageal cancer Neg Hx   . Rectal cancer Neg Hx   . Stomach cancer Neg Hx     SOCIAL HISTORY:  Social History   Social History  . Marital Status: Single    Spouse Name: N/A  . Number of Children: 2  . Years of Education: 12+   Occupational History  . Retired    Social History Main Topics  . Smoking status: Current Every Day Smoker -- 1.00 packs/day    Types: Cigarettes  . Smokeless tobacco: Never Used  . Alcohol Use: 0.0 oz/week    0 Standard drinks or equivalent per week     Comment: socially  . Drug Use: No  . Sexual Activity: Not on file   Other Topics Concern  . Not on file   Social History Narrative   Lives at home with her mother.   Right-handed.   Several sodas per day.     PHYSICAL EXAM   Filed Vitals:   12/23/15 0908  BP: 137/68  Pulse: 104  Height: 5\' 2"  (1.575 m)  Weight: 119 lb (53.978 kg)    Not recorded      Body mass index is 21.76 kg/(m^2).  PHYSICAL EXAMNIATION:  Gen: NAD, conversant, well nourised, obese, well groomed  Cardiovascular: Regular rate rhythm, no peripheral edema, warm, nontender. Eyes: Conjunctivae clear without exudates or hemorrhage Neck: Supple, no carotid bruise. Pulmonary: Clear to auscultation bilaterally   NEUROLOGICAL EXAM:  MENTAL STATUS: Speech:    Speech is normal; fluent and spontaneous with normal comprehension.  Cognition:     Orientation to time, place and person     Normal recent and remote memory     Normal Attention span and concentration     Normal Language, naming, repeating,spontaneous speech     Fund of knowledge   CRANIAL NERVES: CN II: Visual fields are full to confrontation. Fundoscopic exam is normal with sharp discs and no vascular changes. Pupils are round equal and briskly reactive to light. CN III, IV, VI: extraocular movement are normal. No ptosis. CN V: Facial sensation is  intact to pinprick in all 3 divisions bilaterally. Corneal responses are intact.  CN VII: Face is symmetric with normal eye closure and smile. CN VIII: Hearing is normal to rubbing fingers CN IX, X: Palate elevates symmetrically. Phonation is normal. CN XI: Head turning and shoulder shrug are intact CN XII: Tongue is midline with normal movements and no atrophy.  MOTOR: There is no pronator drift of out-stretched arms. Muscle bulk and tone are normal. Muscle strength is normal.  REFLEXES: Reflexes are 2+ and symmetric at the biceps, triceps, knees, and ankles. Plantar responses are flexor.  SENSORY: Intact to light touch, pinprick, position sense, and vibration sense are intact in fingers and toes.  COORDINATION: Rapid alternating movements and fine finger movements are intact. There is no dysmetria on finger-to-nose and heel-knee-shin.    GAIT/STANCE: Posture is normal. Gait is steady with normal steps, base, arm swing, and turning. Heel and toe walking are normal. Tandem gait is normal.  Romberg is absent.   DIAGNOSTIC DATA (LABS, IMAGING, TESTING) - I reviewed patient records, labs, notes, testing and imaging myself where available.   ASSESSMENT AND PLAN  Anita [name] is a 70 y.o. female    Intermittent bilateral shoulder pain, normal neurological examinations  Most consistent with musculoskeletal etiology  Tizanidine 2 mg 3 times a day as needed  Return to clinic for new issues  Marcial Pacas, M.D. Ph.D.  [facility] [address] [place], [place] [address] Ph: [phone] Fax: [phone]  CC: [name], MD

## 2016-01-08 DIAGNOSIS — H43811 Vitreous degeneration, right eye: Secondary | ICD-10-CM | POA: Diagnosis not present

## 2016-02-23 DIAGNOSIS — H43811 Vitreous degeneration, right eye: Secondary | ICD-10-CM | POA: Diagnosis not present

## 2016-06-02 ENCOUNTER — Ambulatory Visit: Payer: Self-pay | Admitting: Internal Medicine

## 2016-06-08 ENCOUNTER — Encounter: Payer: Self-pay | Admitting: Internal Medicine

## 2016-06-08 ENCOUNTER — Ambulatory Visit (INDEPENDENT_AMBULATORY_CARE_PROVIDER_SITE_OTHER)
Admission: RE | Admit: 2016-06-08 | Discharge: 2016-06-08 | Disposition: A | Discharge status: Home / Self Care | Payer: Commercial Managed Care - HMO | Source: Ambulatory Visit | Attending: Internal Medicine | Admitting: Internal Medicine

## 2016-06-08 ENCOUNTER — Other Ambulatory Visit: Payer: Self-pay | Admitting: Internal Medicine

## 2016-06-08 ENCOUNTER — Ambulatory Visit (INDEPENDENT_AMBULATORY_CARE_PROVIDER_SITE_OTHER): Payer: Commercial Managed Care - HMO | Admitting: Internal Medicine

## 2016-06-08 VITALS — BP 128/78 | HR 99 | Temp 98.3°F | Resp 20 | Ht 62.5 in | Wt 118.0 lb

## 2016-06-08 DIAGNOSIS — M549 Dorsalgia, unspecified: Secondary | ICD-10-CM | POA: Diagnosis not present

## 2016-06-08 DIAGNOSIS — M50321 Other cervical disc degeneration at C4-C5 level: Secondary | ICD-10-CM | POA: Diagnosis not present

## 2016-06-08 DIAGNOSIS — M546 Pain in thoracic spine: Secondary | ICD-10-CM | POA: Diagnosis not present

## 2016-06-08 NOTE — Patient Instructions (Signed)
We are checking the x-ray of the back today to try to find the cause of the pain.

## 2016-06-08 NOTE — Progress Notes (Signed)
   Subjective:    Patient ID: Anita [name], female    DOB: 11-08-1946, 70 y.o.   MRN: [id number]  HPI The patient is a 70 YO female coming in for mid back pain. She has had it off and on for months. She is very concerned about cancer and wants a cancer screening which she heard about from her friend. She denies weight change, fevers or chills. The pain stays in her mid back. No radiation. She has tried muscle relaxers which were not helpful. She saw orthopedics last year for that and knee pain and they did not evaluate. She saw neurology and they thought it was muscular but she does not agree. She wants some evaluation of it.   Review of Systems  Constitutional: Negative for fever, activity change, appetite change, fatigue and unexpected weight change.  HENT: Negative.   Respiratory: Negative for cough, chest tightness, shortness of breath and wheezing.   Cardiovascular: Negative for chest pain, palpitations and leg swelling.  Gastrointestinal: Negative for nausea, abdominal pain, diarrhea, constipation and abdominal distention.  Musculoskeletal: Positive for back pain. Negative for gait problem.  Skin: Negative.   Neurological: Negative.  Negative for weakness and numbness.  Psychiatric/Behavioral: Negative.       Objective:   Physical Exam  Constitutional: She is oriented to person, place, and time. She appears well-developed and well-nourished.  HENT:  Head: Normocephalic and atraumatic.  Eyes: EOM are normal.  Neck: Normal range of motion.  Cardiovascular: Normal rate and regular rhythm.   Pulmonary/Chest: Effort normal and breath sounds normal. No respiratory distress. She has no wheezes. She has no rales.  Abdominal: Soft. Bowel sounds are normal. She exhibits no distension. There is no tenderness. There is no rebound.  Musculoskeletal: She exhibits no edema.  Mid back pain around the scapula, not radiating  Neurological: She is alert and oriented to person, place, and time.  Coordination normal.  Skin: Skin is warm and dry.   Filed Vitals:   06/08/16 1013  BP: 128/78  Pulse: 99  Temp: 98.3 F (36.8 C)  TempSrc: Oral  Resp: 20  Height: 5' 2.5" (1.588 m)  Weight: 118 lb (53.524 kg)  SpO2: 90%      Assessment & Plan:

## 2016-06-08 NOTE — Assessment & Plan Note (Addendum)
Checking x-ray mid and cervical back. She does have tenderness in the muscles on exam and not midline so this is likely muscular pain. She is not satisfied with this. No red flag signs to indicate need for MRI or other imaging.

## 2016-06-08 NOTE — Progress Notes (Signed)
Pre visit review using our clinic review tool, if applicable. No additional management support is needed unless otherwise documented below in the visit note. 

## 2016-06-09 ENCOUNTER — Telehealth: Payer: Self-pay | Admitting: Internal Medicine

## 2016-06-09 NOTE — Telephone Encounter (Signed)
Patient called in stating she did want to go ahead with MRI

## 2016-06-09 NOTE — Telephone Encounter (Signed)
Patient states she is also in a lot of pain today.  She is requesting additional pain med.  Please follow up in regard.

## 2016-06-10 ENCOUNTER — Telehealth: Payer: Self-pay

## 2016-06-10 MED ORDER — DICLOFENAC SODIUM 75 MG PO TBEC
75.0000 mg | DELAYED_RELEASE_TABLET | Freq: Two times a day (BID) | ORAL | Status: DC
Start: 2016-06-10 — End: 2016-09-03

## 2016-06-10 NOTE — Telephone Encounter (Signed)
Left message for patient to call back  

## 2016-06-10 NOTE — Telephone Encounter (Signed)
Patient called back about the mri and pain meds. She is did not understand the notes at all. She is saying the lady that did her xrays told her she needed a mri and so on. Can you please follow up with her.

## 2016-06-10 NOTE — Telephone Encounter (Signed)
We discussed at the visit that an MRI was not indicated. Have sent in voltaren pills which she can take up to 2 times a day.

## 2016-06-11 NOTE — Telephone Encounter (Signed)
Patient is aware and will call back Monday if she is not feeling better.

## 2016-06-11 NOTE — Telephone Encounter (Signed)
Patient called back again about this issues. Can you please follow up. Thank you.

## 2016-06-11 NOTE — Telephone Encounter (Signed)
Spoke with patient.

## 2016-06-13 ENCOUNTER — Encounter (HOSPITAL_COMMUNITY): Payer: Self-pay | Admitting: Emergency Medicine

## 2016-06-13 ENCOUNTER — Emergency Department (HOSPITAL_COMMUNITY)
Admission: EM | Admit: 2016-06-13 | Discharge: 2016-06-13 | Disposition: A | Discharge status: Home / Self Care | Payer: Commercial Managed Care - HMO | Attending: Emergency Medicine | Admitting: Emergency Medicine

## 2016-06-13 DIAGNOSIS — F17210 Nicotine dependence, cigarettes, uncomplicated: Secondary | ICD-10-CM | POA: Diagnosis not present

## 2016-06-13 DIAGNOSIS — M1990 Unspecified osteoarthritis, unspecified site: Secondary | ICD-10-CM | POA: Diagnosis not present

## 2016-06-13 DIAGNOSIS — M546 Pain in thoracic spine: Secondary | ICD-10-CM | POA: Diagnosis not present

## 2016-06-13 MED ORDER — TRAMADOL HCL 50 MG PO TABS: 50.0000 mg | ORAL_TABLET | Freq: Four times a day (QID) | ORAL | Status: DC | PRN

## 2016-06-13 MED ORDER — METHOCARBAMOL 500 MG PO TABS: 500.0000 mg | ORAL_TABLET | Freq: Two times a day (BID) | ORAL | Status: DC

## 2016-06-13 NOTE — ED Provider Notes (Signed)
CSN: [id number]     Arrival date & time 06/13/16  1025 History   First MD Initiated Contact with Patient 06/13/16 1129     Chief Complaint  Patient presents with  . Back Pain     (Consider location/radiation/quality/duration/timing/severity/associated sxs/prior Treatment) HPI   Anita [name] is a 70 y.o F with a pmhx of arthritis who presents to the ED today c/o back pain. Pt states that she has been having Left-sided mid back pain for the last 2 years that has gotten worse over the last week. Pain is worse with movement or palpation of the area. Patient states she has been seen by her PCP as well as a neurologist for this without any relief of her symptoms. She has tried taking different muscle relaxers without relief. She has had x-rays done in the past that showed arthritis but patient does not believe that she has arthritis. Patient is concerned that she may have cancer and is requesting an MRI. Patient was told by one of her friends that an MRI was the only way to rule out cancer so this is she is requesting. Patient is able to ambulate without difficulty. She denies any trauma or injury. She denies bowel or bladder incontinence, saddle anesthesia, fever.   Past Medical History  Diagnosis Date  . Allergy   . Dry eyes   . Arthritis   . Cataract     bilateral  . Hx of adenomatous polyp of colon 03/06/2015   Past Surgical History  Procedure Laterality Date  . Hemorrhoid surgery    . Tubal ligation    . Foot surgery     Family History  Problem Relation Age of Onset  . Hypertension Mother   . Arthritis Mother   . Colon cancer Neg Hx   . Esophageal cancer Neg Hx   . Rectal cancer Neg Hx   . Stomach cancer Neg Hx    Social History  Substance Use Topics  . Smoking status: Current Every Day Smoker -- 1.00 packs/day    Types: Cigarettes  . Smokeless tobacco: Never Used  . Alcohol Use: 0.0 oz/week    0 Standard drinks or equivalent per week     Comment: socially   OB  History    No data available     Review of Systems  All other systems reviewed and are negative.     Allergies  Review of patient's allergies indicates no known allergies.  Home Medications   Prior to Admission medications   Medication Sig Start Date End Date Taking? Authorizing Provider  diclofenac (VOLTAREN) 75 MG EC tablet Take 1 tablet (75 mg total) by mouth 2 (two) times daily. 06/10/16   [name], MD  prednisoLONE acetate (PRED FORTE) 1 % ophthalmic suspension Reported on 06/08/2016 11/24/15   Historical Provider, MD  tiZANidine (ZANAFLEX) 2 MG tablet Take 1 tablet (2 mg total) by mouth every 8 (eight) hours as needed for muscle spasms. Patient not taking: Reported on 06/08/2016 12/23/15   Marcial Pacas, MD   BP 166/75 mmHg  Pulse 95  Temp(Src) 98.8 F (37.1 C) (Oral)  Resp 16  Ht 5\' 2"  (1.575 m)  Wt 53.524 kg  BMI 21.58 kg/m2  SpO2 100% Physical Exam  Constitutional: She is oriented to person, place, and time. She appears well-developed and well-nourished. No distress.  HENT:  Head: Normocephalic and atraumatic.  Eyes: Conjunctivae are normal. Right eye exhibits no discharge. Left eye exhibits no discharge. No scleral icterus.  Cardiovascular: Normal rate.   Pulmonary/Chest: Effort normal and breath sounds normal.  Musculoskeletal:  TTP over left thoracic paraspinal muscles. No midline spinal tenderness. FROM of C, T, L spine. No step offs or obvious bony deformities. Negative SLR.   Neurological: She is alert and oriented to person, place, and time. Coordination normal.  Strength 5/5 throughout. No sensory deficits.  No gait abnormality.  Skin: Skin is warm and dry. No rash noted. She is not diaphoretic. No erythema. No pallor.  Psychiatric: She has a normal mood and affect. Her behavior is normal.  Nursing note and vitals reviewed.   ED Course  Procedures (including critical care time) Labs Review Labs Reviewed - No data to display  Imaging Review No  results found. I have personally reviewed and evaluated these images and lab results as part of my medical decision-making.   EKG Interpretation None      MDM   Final diagnoses:  Left-sided thoracic back pain    70 y.o F presents to the ED c/o left sided mid back pain that has been present for 2 years. No neurological deficits and normal neuro exam. Pt is ambulatory in the ED without difficulty. No loss of bowel or bladder control.  No concern for cauda equina.  No fever, night sweats, weight loss, h/o cancer, IVDU. Upon review of pts records she has been evaluated numerous times for her back pain by both her PCP and a neurologist. She has also had normal xrays of spine. Will not repeat today.Pt is concerned that she has cancer and is requesting an MRI. No weight loss, night sweats or chills. Do not feel that pt needs this on an emergent basis. Discussed this with pt who expresses understanding. Recommend NSAIDs and muscle relaxers for now. Will give referral to orthopedic provider per request of pt. RICE protocol and pain medicine indicated and discussed with patient. Return precautions outlined in patient discharge instructions.    Patient was discussed with and seen by Dr. Gilford Raid who agrees with the treatment plan.      Dondra Spry [place], PA-C 06/13/16 1426  Isla Pence, MD 06/13/16 [phone]

## 2016-06-13 NOTE — ED Notes (Signed)
PA at bedside.

## 2016-06-13 NOTE — Discharge Instructions (Signed)
Thoracic Strain A thoracic strain, which is sometimes called a mid-back strain, is an injury to the muscles or tendons that attach to the upper part of your back behind your chest. This type of injury occurs when a muscle is overstretched or overloaded.  Thoracic strains can range from mild to severe. Mild strains may involve stretching a muscle or tendon without tearing it. These injuries may heal in 1-2 weeks. More severe strains involve tearing of muscle fibers or tendons. These will cause more pain and may take 6-8 weeks to heal. CAUSES This condition may be caused by:  An injury in which a sudden force is placed on the muscle.  Exercising without properly warming up.  Overuse of the muscle.  Improper form during certain movements.  Other injuries that surround or cause stress on the mid-back, causing a strain on the muscles. In some cases, the cause may not be known. RISK FACTORS This injury is more common in:  Athletes.  People with obesity. SYMPTOMS The main symptom of this condition is pain, especially with movement. Other symptoms include:  Bruising.  Swelling.  Spasm. DIAGNOSIS This condition may be diagnosed with a physical exam. X-rays may be taken to check for a fracture. TREATMENT This condition may be treated with:  Resting and icing the injured area.  Physical therapy. This will involve doing stretching and strengthening exercises.  Medicines for pain and inflammation. HOME CARE INSTRUCTIONS  Rest as needed. Follow instructions from your health care provider about any restrictions on activity.  If directed, apply ice to the injured area:  Put ice in a plastic bag.  Place a towel between your skin and the bag.  Leave the ice on for 20 minutes, 2-3 times per day.  Take over-the-counter and prescription medicines only as told by your health care provider.  Begin doing exercises as told by your health care provider or physical therapist.  Always  warm up properly before physical activity or sports.  Bend your knees before you lift heavy objects.  Keep all follow-up visits as told by your health care provider. This is important. SEEK MEDICAL CARE IF:  Your pain is not helped by medicine.  Your pain, bruising, or swelling is getting worse.  You have a fever. SEEK IMMEDIATE MEDICAL CARE IF:  You have shortness of breath.  You have chest pain.  You develop numbness or weakness in your legs.  You have involuntary loss of urine (urinary incontinence).   This information is not intended to replace advice given to you by your health care provider. Make sure you discuss any questions you have with your health care provider.   Follow up with Dr. Ninfa Linden with orthopedics or your primary care provider for re-evaluation. Apply ice to affected area. Take tramadol as needed for pain and Robaxin for muscle tension relief. Return to the ED if you experience inability to move your lower extremities, numbness/tingling in both of your legs, fever, blood in your stool or difficulty urinating.

## 2016-06-13 NOTE — ED Notes (Signed)
Patient here with complaints of back pain, increased on left side. Denies urinary symptoms. Pain 10/10.

## 2016-06-28 ENCOUNTER — Telehealth: Payer: Self-pay

## 2016-06-28 DIAGNOSIS — M549 Dorsalgia, unspecified: Secondary | ICD-10-CM

## 2016-06-28 NOTE — Telephone Encounter (Signed)
Patient called and states she needs a referral to a orthped doctor, Dr.Blackman [phone]. Because of her back pain! Thank you.

## 2016-06-29 ENCOUNTER — Encounter: Payer: Self-pay | Admitting: Internal Medicine

## 2016-06-29 NOTE — Telephone Encounter (Signed)
Placed.

## 2016-07-07 NOTE — ED Provider Notes (Signed)
Medical screening examination/treatment/procedure(s) were conducted as a shared visit with non-physician practitioner(s) and myself.  I personally evaluated the patient during the encounter.  Agree with plan.   EKG Interpretation None       Isla Pence, MD 07/07/16 [phone]

## 2016-07-20 ENCOUNTER — Telehealth: Payer: Self-pay | Admitting: *Deleted

## 2016-07-20 NOTE — Telephone Encounter (Signed)
Did she come in for follow up? If we have never prescribed the tramadol she would need a visit as this is a controlled substance.

## 2016-07-20 NOTE — Telephone Encounter (Signed)
Notified pt w/MD response. Made appt for tomorrow to see [name]...[name]

## 2016-07-20 NOTE — Telephone Encounter (Signed)
Rec'd call pt states she was seen ED last month for her back pain. They gave her rx for Tramadol, and she is needing a refill. She has an appt to see Dr. Rush Farmer, but its not until 8/23. Pls advise on refill for tramadol...Anita [name]

## 2016-07-21 ENCOUNTER — Ambulatory Visit (INDEPENDENT_AMBULATORY_CARE_PROVIDER_SITE_OTHER): Payer: Commercial Managed Care - HMO | Admitting: Family

## 2016-07-21 ENCOUNTER — Encounter: Payer: Self-pay | Admitting: Family

## 2016-07-21 VITALS — BP 160/102 | HR 97 | Resp 16 | Ht 62.5 in | Wt 117.0 lb

## 2016-07-21 DIAGNOSIS — M546 Pain in thoracic spine: Secondary | ICD-10-CM | POA: Diagnosis not present

## 2016-07-21 MED ORDER — TRAMADOL HCL 50 MG PO TABS: 50.0000 mg | ORAL_TABLET | Freq: Four times a day (QID) | ORAL | 0 refills | Status: DC | PRN

## 2016-07-21 MED ORDER — VALACYCLOVIR HCL 1 G PO TABS: 1000.0000 mg | ORAL_TABLET | Freq: Three times a day (TID) | ORAL | 0 refills | Status: DC

## 2016-07-21 MED ORDER — KETOROLAC TROMETHAMINE 60 MG/2ML IM SOLN
60.0000 mg | Freq: Once | INTRAMUSCULAR | Status: AC
  Administered 2016-07-21: 60 mg via INTRAMUSCULAR

## 2016-07-21 NOTE — Patient Instructions (Signed)
Thank you for choosing [facility].  Summary/Instructions:  Please continue to take your medications as prescribed.   Please start the valacyclovir.   Follow up with the back specialist as scheduled.   Your prescription(s) have been submitted to your pharmacy or been printed and provided for you. Please take as directed and contact our office if you believe you are having problem(s) with the medication(s) or have any questions.  If your symptoms worsen or fail to improve, please contact our office for further instruction, or in case of emergency go directly to the emergency room at the closest medical facility.

## 2016-07-21 NOTE — Assessment & Plan Note (Addendum)
Thoracic back pain with tenderness that appears hypersensitive given current symptoms and no trauma. Question possible underlying shingles or neuropathy and cannot rule out regional pain syndrome. Start valacyclovir. In office injection of Toradol provided. Continue current dosage of tramadol. Continue with follow-up for back specialist.

## 2016-07-21 NOTE — Progress Notes (Signed)
Subjective:    Patient ID: Anita [name], female    DOB: [birth year], 70 y.o.   MRN: [id number]  Chief Complaint  Patient presents with  . Medication Refill    needs refill of tramadol for back pain    HPI:  Anita [name] is a 70 y.o. female who  has a past medical history of Allergy; Arthritis; Cataract; Dry eyes; and adenomatous polyp of colon (03/06/2015). and presents today for a follow up office visit.  This is a chornic problem. Continues to experience pain located in her mid back that has been going on for about 2 years. Has been seen in primary care and neurology with appointment with a back specialist coming up. Previous x-rays showed arthritis, although patient is concered for possible cancer. She was prescribed Tramadol and methocarbamol. Reports taking the medicaiton as prescribed and denies adverse side effects. Notes that the Tramadol has helped with her pain. Continues to experience pain that is sharp and burning. Aggravating factors include using her hands. There are no factors that make it better. She is able to complete her activities of daily living. When resting the pain is improved on occasion. Denies numbness or tingling. States it feels like it is tender. Denies any itching or history of shingles. No trauma that she can recall. Pain is sensitive to clothing.    No Known Allergies   Current Outpatient Prescriptions on File Prior to Visit  Medication Sig Dispense Refill  . diclofenac (VOLTAREN) 75 MG EC tablet Take 1 tablet (75 mg total) by mouth 2 (two) times daily. 60 tablet 0  . methocarbamol (ROBAXIN) 500 MG tablet Take 1 tablet (500 mg total) by mouth 2 (two) times daily. 20 tablet 0  . prednisoLONE acetate (PRED FORTE) 1 % ophthalmic suspension Reported on 06/08/2016  0  . tiZANidine (ZANAFLEX) 2 MG tablet Take 1 tablet (2 mg total) by mouth every 8 (eight) hours as needed for muscle spasms. 90 tablet 6   No current facility-administered medications on file  prior to visit.      Review of Systems  Constitutional: Negative for chills and fever.  Musculoskeletal: Positive for back pain.  Skin: Negative for rash.  Neurological: Negative for dizziness, weakness and numbness.      Objective:    BP (!) 160/102 (BP Location: Left Arm, Patient Position: Sitting, Cuff Size: Normal)   Pulse 97   Resp 16   Ht 5' 2.5" (1.588 m)   Wt 117 lb (53.1 kg)   SpO2 99%   BMI 21.06 kg/m  Nursing note and vital signs reviewed.  Physical Exam  Constitutional: She is oriented to person, place, and time. She appears well-developed and well-nourished. No distress.  Cardiovascular: Normal rate, regular rhythm, normal heart sounds and intact distal pulses.   Pulmonary/Chest: Effort normal and breath sounds normal.  Musculoskeletal:  Thoracic spine -  questionable edema noted left thoracic spine compared to the contralateral side. No other deformity or discoloration. No rashes evident. Patient is hypersensitive to even mild touch and unable to complete full assessment secondary to patient discomfort and pain. Straight leg raises negative. Distal upper extremity pulses and sensation are intact and appropriate.  Neurological: She is alert and oriented to person, place, and time.  Skin: Skin is warm and dry.  Psychiatric: She has a normal mood and affect. Her behavior is normal. Judgment and thought content normal.       Assessment & Plan:   Problem List Items Addressed This Visit  Other   Back pain - Primary    Thoracic back pain with tenderness that appears hypersensitive given current symptoms and no trauma. Question possible underlying shingles or neuropathy and cannot rule out regional pain syndrome. Start valacyclovir. In office injection of Toradol provided. Continue current dosage of tramadol. Continue with follow-up for back specialist.      Relevant Medications   traMADol (ULTRAM) 50 MG tablet   valACYclovir (VALTREX) 1000 MG tablet    ketorolac (TORADOL) injection 60 mg (Completed)    Other Visit Diagnoses   None.      I am having Anita [name] start on valACYclovir. I am also having her maintain her prednisoLONE acetate, tiZANidine, diclofenac, methocarbamol, and traMADol. We administered ketorolac.   Meds ordered this encounter  Medications  . traMADol (ULTRAM) 50 MG tablet    Sig: Take 1 tablet (50 mg total) by mouth every 6 (six) hours as needed.    Dispense:  30 tablet    Refill:  0    Order Specific Question:   Supervising Provider    Answer:   Pricilla Holm A [id number]  . valACYclovir (VALTREX) 1000 MG tablet    Sig: Take 1 tablet (1,000 mg total) by mouth 3 (three) times daily.    Dispense:  21 tablet    Refill:  0    Order Specific Question:   Supervising Provider    Answer:   Pricilla Holm A [id number]  . ketorolac (TORADOL) injection 60 mg     Follow-up: Return if symptoms worsen or fail to improve.   Mauricio Po, FNP

## 2016-08-11 DIAGNOSIS — M546 Pain in thoracic spine: Secondary | ICD-10-CM | POA: Diagnosis not present

## 2016-08-12 ENCOUNTER — Ambulatory Visit: Payer: Self-pay | Admitting: Internal Medicine

## 2016-08-17 ENCOUNTER — Ambulatory Visit: Payer: Commercial Managed Care - HMO | Attending: Orthopaedic Surgery | Admitting: Physical Therapy

## 2016-08-17 DIAGNOSIS — M546 Pain in thoracic spine: Secondary | ICD-10-CM | POA: Insufficient documentation

## 2016-08-17 DIAGNOSIS — M6281 Muscle weakness (generalized): Secondary | ICD-10-CM | POA: Diagnosis not present

## 2016-08-17 NOTE — Therapy (Signed)
[place], [place], [address] Phone: [phone]   Fax:  [phone]  Physical Therapy Evaluation  Patient Details  Name: Anita [name] MRN: [id number] Date of Birth: July 30, 1946 Referring Provider: Zollie Beckers, MD  Encounter Date: 08/17/2016      PT End of Session - 08/17/16 1029    Visit Number 1   Number of Visits 12   Date for PT Re-Evaluation 10/17/16   PT Start Time 0935   PT Stop Time 1025   PT Time Calculation (min) 50 min   Activity Tolerance Patient tolerated treatment well   Behavior During Therapy [facility] for tasks assessed/performed      Past Medical History:  Diagnosis Date  . Allergy   . Arthritis   . Cataract    bilateral  . Dry eyes   . Hx of adenomatous polyp of colon 03/06/2015    Past Surgical History:  Procedure Laterality Date  . FOOT SURGERY    . HEMORRHOID SURGERY    . TUBAL LIGATION      There were no vitals filed for this visit.       Subjective Assessment - 08/17/16 0938    Subjective Pt reporting thoracic back pain under both arms. Pt reports that the pain has been going on for about 2 years. Pt reporting having difficutly washing dishes, cooking, and cleaning at home. Pt is retired.  Pt reports she can't not recall an event that caused the pain to begin. Pt reporting no pain with deep breathing.    Limitations Sitting;Lifting;Reading;House hold activities   How long can you sit comfortably? 60 minutes   How long can you stand comfortably? unlimited   How long can you walk comfortably? unlimited   Diagnostic tests X-ray June 2017 per pt report   Patient Stated Goals Stop hurting. Be pain free   Currently in Pain? Yes   Pain Score 2    Pain Location Thoracic  spine    Pain Orientation Left;Right   Pain Descriptors / Indicators Aching   Pain Type Chronic pain   Pain Onset More than a month ago   Pain Frequency Constant   Aggravating Factors  Moving  around, household activities   Pain Relieving Factors resting, pain meds   Effect of Pain on Daily Activities Pt having difficutly with household activites such as washing dishes, ADL's, donning shirts and bras.    Multiple Pain Sites No            [facility] PT Assessment - 08/17/16 0001      Assessment   Medical Diagnosis Thoracic Back Pain-bilaterally   Referring Provider Zollie Beckers, MD   Hand Dominance Right   Next MD Visit 09/15/16   Prior Therapy none     Precautions   Precautions None     Restrictions   Weight Bearing Restrictions No     Balance Screen   Has the patient fallen in the past 6 months No   Is the patient reluctant to leave their home because of a fear of falling?  No     Home Social worker Private residence   Living Arrangements Parent   Available Help at Discharge Family   Type of [place] seat   Additional Comments Pt's mother lives with her and was reported to have a hip replacement about 5 years ago. Pt  is helping take care of her mom.      Prior Function   Level of Independence Independent   Vocation Retired   Leisure Human resources officer   Overall Cognitive Status Within Functional Limits for tasks assessed     Observation/Other Assessments   Focus on Therapeutic Outcomes (FOTO)  44% limited     Coordination   Fine Motor Movements are Fluid and Coordinated Yes   Heel Shin Test intact     Functional Tests   Functional tests Sit to Stand     Sit to Stand   Comments Pt able to perform sit to stand with no UE support     Posture/Postural Control   Posture/Postural Control Postural limitations   Postural Limitations Rounded Shoulders;Forward head;Increased thoracic kyphosis     ROM / Strength   AROM / PROM / Strength AROM;Strength     AROM   AROM Assessment Site Thoracic   Thoracic Flexion 130   Thoracic Extension 35    Thoracic - Right Side Bend 20   Thoracic - Left Side Bend 25   Thoracic - Right Rotation WNL   Thoracic - Left Rotation WNL     Strength   Overall Strength Deficits   Strength Assessment Site Shoulder;Hip;Knee   Right/Left Shoulder Right;Left   Right Shoulder Flexion 4-/5   Right Shoulder Extension 4-/5   Left Shoulder Flexion 4-/5   Left Shoulder Extension 4-/5   Right/Left Hip Right;Left   Right Hip Flexion 4-/5   Right Hip Extension 4-/5   Right Hip ABduction 4-/5   Left Hip Flexion 4-/5   Left Hip Extension 4-/5   Left Hip ABduction 4-/5   Right/Left Knee Right;Left   Right Knee Flexion 4-/5   Right Knee Extension 4-/5   Left Knee Flexion 4-/5   Left Knee Extension 4-/5     Palpation   Palpation comment Pt with tenderness noted to bilateral ribs      Ambulation/Gait   Ambulation/Gait Yes   Ambulation/Gait Assistance 7: Independent   Gait Comments pt with genu valgus noted R > L     Balance   Balance Assessed Yes     High Level Balance   High Level Balance Comments Single leg stance (SLS) Right: 8 seconds, Left 9 seconds                           PT Education - 08/17/16 1027    Education provided Yes   Education Details Pt was edu in purpose of Physical Therapy and Posture Correction. Pt was issued a HEP for rows, sitting posture and standing posture.    Person(s) Educated Patient   Methods Explanation;Demonstration;Verbal cues;Handout;Tactile cues   Comprehension Verbalized understanding;Returned demonstration;Verbal cues required;Tactile cues required          PT Short Term Goals - 08/17/16 1304      PT SHORT TERM GOAL #1   Title Pt will be independent with her HEP.    Baseline Issued pt home exercises   Time 3   Period Weeks   Status New     PT SHORT TERM GOAL #2   Title Pt able to report no pain during the day with daily activities in order to improve function.    Baseline Pt reporting constant pain.   Time 3   Period Weeks    Status New           PT Long  Term Goals - 2016/08/29 1307      PT LONG TERM GOAL #1   Title Pt will improve FOTO score from 44% limitation to <35% limitation to improve overall functional mobility.   Baseline 44% limitation   Time 6   Period Weeks   Status New     PT LONG TERM GOAL #2   Title Pt will improve bilateral LE strength to grossly 5/5 in hips and knees order to improve functional mobility.   Baseline Grossly -4/5 in bilateral LE's hip and knees   Time 6   Period Weeks   Status New               Plan - 2016/08/29 1029    Clinical Impression Statement Pt is a 70 year old female c/o chronic thoracic back pain bilaterally. Pt reporting that it has been going on for almost 2 years and she is tired of just being given pain meds to control it.    Rehab Potential Good   PT Frequency 2x / week   PT Duration 6 weeks   PT Treatment/Interventions ADLs/Self Care Home Management;Moist Heat;Therapeutic activities;Therapeutic exercise;Taping;Manual techniques;Balance training;Cryotherapy;Electrical Stimulation;Iontophoresis 4mg /ml Dexamethasone;Functional mobility training;Patient/family education;Passive range of motion   PT Next Visit Plan Review posture exercises, Thoracic stretching and core strengthening,  Iontophoresis   PT Home Exercise Plan Issued pt Rows with red theraband, sitting posture correction and standing posture exercise   Consulted and Agree with Plan of Care Patient      Patient will benefit from skilled therapeutic intervention in order to improve the following deficits and impairments:  Decreased activity tolerance, Impaired perceived functional ability, Pain, Improper body mechanics, Postural dysfunction, Decreased balance, Decreased strength, Decreased mobility, Impaired UE functional use  Visit Diagnosis: Pain in thoracic spine  Muscle weakness (generalized)      G-Codes - 2016-08-29 1311    Functional Assessment Tool Used FOTO, clinical judgement    Functional Limitation Mobility: Walking and moving around   Mobility: Walking and Moving Around Current Status [phone]) At least 40 percent but less than 60 percent impaired, limited or restricted   Mobility: Walking and Moving Around Goal Status [phone]) At least 20 percent but less than 40 percent impaired, limited or restricted       Problem List Patient Active Problem List   Diagnosis Date Noted  . Shoulder pain 12/23/2015  . Hx of adenomatous polyp of colon 03/06/2015  . Back pain 12/06/2014  . Routine general medical examination at a health care facility 12/06/2014    Oretha Caprice August 29, 2016, 1:12 PM  [facility] [address] [place], [place], [address] Phone: [phone]   Fax:  [phone]  Name: Anita [name] MRN: [id number] Date of Birth: Sep 13, 1946  Kearney Hard, PT 08-29-16 1:12 PM

## 2016-08-19 ENCOUNTER — Ambulatory Visit: Payer: Commercial Managed Care - HMO | Admitting: Physical Therapy

## 2016-08-19 DIAGNOSIS — M6281 Muscle weakness (generalized): Secondary | ICD-10-CM

## 2016-08-19 DIAGNOSIS — M546 Pain in thoracic spine: Secondary | ICD-10-CM

## 2016-08-19 NOTE — Therapy (Signed)
[facility] [address] [place], [place], [address] Phone: [phone]   Fax:  [phone]  Physical Therapy Treatment  Patient Details  Name: Anita [name] MRN: [id number] Date of Birth: 10/16/1946 Referring Provider: Allie Bossierhris Blackman, MD  Encounter Date: 08/19/2016      PT End of Session - 08/19/16 1824    Visit Number 2   Number of Visits 12   Date for PT Re-Evaluation 10/17/16   PT Start Time 1500   PT Stop Time 1547   PT Time Calculation (min) 47 min   Activity Tolerance Patient tolerated treatment well   Behavior [facility] for tasks assessed/performed      Past Medical History:  Diagnosis Date  . Allergy   . Arthritis   . Cataract    bilateral  . Dry eyes   . Hx of adenomatous polyp of colon 03/06/2015    Past Surgical History:  Procedure Laterality Date  . FOOT SURGERY    . HEMORRHOID SURGERY    . TUBAL LIGATION      There were no vitals filed for this visit.      Subjective Assessment - 08/19/16 1509    Subjective Tighteness 1/10  vs pain, better with medication..,  Otherwise pain same as eval.     Currently in Pain? Yes   Pain Score 1    Pain Location Thoracic   Pain Orientation Left;Right   Pain Descriptors / Indicators --  stiff                         [facility] Adult PT Treatment/Exercise - 08/19/16 0001      Neck Exercises: Supine   Neck Retraction 5 reps   Shoulder Abduction Limitations Horizontal abduction 5 X stopped due to cramp, yellow band   Other Supine Exercise Narrow grip 10 X yellow band   Other Supine Exercise cervical stabilization series AROM with head press,  circles both ways, Horizontal abd,  flexion/ extension 5-10 X each     Lumbar Exercises: Supine   Other Supine Lumbar Exercises Decompression series all practiced 5 X each, 5 seconds hold, HEP                PT Education - 08/19/16 1823    Education provided Yes   Education Details  decompression   Person(s) Educated Patient   Methods Explanation;Demonstration;Tactile cues;Verbal cues;Handout   Comprehension Verbalized understanding;Returned demonstration          PT Short Term Goals - 08/17/16 1304      PT SHORT TERM GOAL #1   Title Pt will be independent with her HEP.    Baseline Issued pt home exercises   Time 3   Period Weeks   Status New     PT SHORT TERM GOAL #2   Title Pt able to report no pain during the day with daily activities in order to improve function.    Baseline Pt reporting constant pain.   Time 3   Period Weeks   Status New           PT Long Term Goals - 08/17/16 1307      PT LONG TERM GOAL #1   Title Pt will improve FOTO score from 44% limitation to <35% limitation to improve overall functional mobility.   Baseline 44% limitation   Time 6   Period Weeks   Status New     PT LONG TERM GOAL #2  Title Pt will improve bilateral LE strength to grossly 5/5 in hips and knees order to improve functional mobility.   Baseline Grossly -4/5 in bilateral LE's hip and knees   Time 6   Period Weeks   Status New               Plan - 08/19/16 1824    Clinical Impression Statement Progress toward home exercise program.  Patient now has intermittant pain,  no pain at end of session.  Progress toward home exercise goals.  Posture greatly improved standing post session   PT Next Visit Plan Decompression ex, ionto (No time today)  core   PT Home Exercise Plan devcompression,  Green band for rows   Consulted and Agree with Plan of Care Patient      Patient will benefit from skilled therapeutic intervention in order to improve the following deficits and impairments:     Visit Diagnosis: Pain in thoracic spine  Muscle weakness (generalized)     Problem List Patient Active Problem List   Diagnosis Date Noted  . Shoulder pain 12/23/2015  . Hx of adenomatous polyp of colon 03/06/2015  . Back pain 12/06/2014  . Routine  general medical examination at a health care facility 12/06/2014    [facility] 08/19/2016, 6:26 PM  [place], [place], [address] Phone: [phone]   Fax:  [phone]  Name: Anita [name] MRN: [id number] Date of Birth: September 28, 1946   Melvenia Needles, PTA 08/19/16 6:26 PM Phone: [phone] Fax: [phone]

## 2016-08-19 NOTE — Patient Instructions (Signed)
Decompression   Issued from exercise drawer  All exercises issued 3 to X 5 each. daily

## 2016-08-25 ENCOUNTER — Ambulatory Visit: Payer: Commercial Managed Care - HMO | Attending: Orthopaedic Surgery | Admitting: Physical Therapy

## 2016-08-25 DIAGNOSIS — M6281 Muscle weakness (generalized): Secondary | ICD-10-CM

## 2016-08-25 DIAGNOSIS — M546 Pain in thoracic spine: Secondary | ICD-10-CM | POA: Diagnosis not present

## 2016-08-25 NOTE — Therapy (Signed)
[place], [place], [address] Phone: [phone]   Fax:  [phone]  Physical Therapy Treatment  Patient Details  Name: Anita [name] MRN: [id number] Date of Birth: 01/30/1946 Referring Provider: Zollie Beckers, MD  Encounter Date: 08/25/2016      PT End of Session - 08/25/16 0915    Visit Number 3   Number of Visits 12   Date for PT Re-Evaluation 10/17/16   PT Start Time 0800   PT Stop Time 0900   PT Time Calculation (min) 60 min      Past Medical History:  Diagnosis Date  . Allergy   . Arthritis   . Cataract    bilateral  . Dry eyes   . Hx of adenomatous polyp of colon 03/06/2015    Past Surgical History:  Procedure Laterality Date  . FOOT SURGERY    . HEMORRHOID SURGERY    . TUBAL LIGATION      There were no vitals filed for this visit.      Subjective Assessment - 08/25/16 0802    Subjective a little tightness yesterday, but i was doing alot of yard work    Currently in Pain? No/denies   Aggravating Factors  yard work   Pain Relieving Factors lay down, rest meds                         [facility] Adult PT Treatment/Exercise - 08/25/16 0001      Neck Exercises: Supine   Neck Retraction 5 reps   Other Supine Exercise Narrow grip 10 X yellow band, horizontal abduction x 10 yellow, ER x 10 , sash x 10 all yellow band    Other Supine Exercise crevical stabilization series AROM with head press,  circles both ways, Horizontal abd,  flexion/ extension 5-10 X each     Lumbar Exercises: Stretches   Lower Trunk Rotation Limitations book opening upper trunk rotations x 5 each      Lumbar Exercises: Aerobic   Stationary Bike Nustep L3 UE/LE x 5 minutes     Lumbar Exercises: Seated   Other Seated Lumbar Exercises thoracic extension in chair 10 sec x 5     Lumbar Exercises: Supine   Other Supine Lumbar Exercises Decompression series all practiced 5 X each, 5 seconds hold,  HEP     Modalities   Modalities Moist Heat     Moist Heat Therapy   Number Minutes Moist Heat 15 Minutes   Moist Heat Location --  thoracic                PT Education - 08/25/16 0840    Education provided Yes   Education Details thoracic rotation and extension stretches   Person(s) Educated Patient   Methods Explanation;Handout   Comprehension Verbalized understanding          PT Short Term Goals - 08/25/16 0803      PT SHORT TERM GOAL #1   Title Pt will be independent with her HEP.    Time 3   Period Weeks   Status On-going     PT SHORT TERM GOAL #2   Title Pt able to report no pain during the day with daily activities in order to improve function.    Time 3   Period Weeks   Status On-going           PT Long Term Goals - 08/17/16 1307  PT LONG TERM GOAL #1   Title Pt will improve FOTO score from 44% limitation to <35% limitation to improve overall functional mobility.   Baseline 44% limitation   Time 6   Period Weeks   Status New     PT LONG TERM GOAL #2   Title Pt will improve bilateral LE strength to grossly 5/5 in hips and knees order to improve functional mobility.   Baseline Grossly -4/5 in bilateral LE's hip and knees   Time 6   Period Weeks   Status New               Plan - 08/25/16 0849    Clinical Impression Statement pt reports PT exercises are helpful however she is still having pain. We continued scapular/ postural exercises with pt requiring cues for correct form. Added thoracic rotation and extension stretching to HEP. Trial of HMP to thoracic spine.    PT Next Visit Plan Decompression ex, ionto if needed, review thoracic stretches, continue supine and standing scap strength/posture   PT Home Exercise Plan decompression,  Green band for rows, thoracic rotation and extension      Patient will benefit from skilled therapeutic intervention in order to improve the following deficits and impairments:  Decreased activity  tolerance, Impaired perceived functional ability, Pain, Improper body mechanics, Postural dysfunction, Decreased balance, Decreased strength, Decreased mobility, Impaired UE functional use  Visit Diagnosis: Pain in thoracic spine  Muscle weakness (generalized)     Problem List Patient Active Problem List   Diagnosis Date Noted  . Shoulder pain 12/23/2015  . Hx of adenomatous polyp of colon 03/06/2015  . Back pain 12/06/2014  . Routine general medical examination at a health care facility 12/06/2014    Dorene Ar, [place] 08/25/2016, [place], [place], [address] Phone: [phone]   Fax:  [phone]  Name: Anita [name] MRN: [id number] Date of Birth: 11-06-1946

## 2016-08-26 DIAGNOSIS — H3561 Retinal hemorrhage, right eye: Secondary | ICD-10-CM | POA: Diagnosis not present

## 2016-08-30 ENCOUNTER — Ambulatory Visit: Payer: Commercial Managed Care - HMO | Admitting: Physical Therapy

## 2016-08-30 DIAGNOSIS — M546 Pain in thoracic spine: Secondary | ICD-10-CM | POA: Diagnosis not present

## 2016-08-30 DIAGNOSIS — M6281 Muscle weakness (generalized): Secondary | ICD-10-CM

## 2016-08-30 NOTE — Therapy (Signed)
[place], [place], [address] Phone: [phone]   Fax:  [phone]  Physical Therapy Treatment  Patient Details  Name: Anita [name] MRN: [id number] Date of Birth: [birth year] Referring Provider: Zollie Beckers, MD  Encounter Date: 08/30/2016      PT End of Session - 08/30/16 0843    Visit Number 4   Number of Visits 12   Date for PT Re-Evaluation 10/17/16   PT Start Time 0800   PT Stop Time 0842   PT Time Calculation (min) 42 min   Activity Tolerance Patient tolerated treatment well;No increased pain   Behavior During Therapy WFL for tasks assessed/performed      Past Medical History:  Diagnosis Date  . Allergy   . Arthritis   . Cataract    bilateral  . Dry eyes   . Hx of adenomatous polyp of colon 03/06/2015    Past Surgical History:  Procedure Laterality Date  . FOOT SURGERY    . HEMORRHOID SURGERY    . TUBAL LIGATION      There were no vitals filed for this visit.      Subjective Assessment - 08/30/16 0811    Subjective I don't have any pain.  I have been working on my posture.    Currently in Pain? Yes   Pain Score 0-No pain                         [facility] Adult PT Treatment/Exercise - 08/30/16 0001      Lumbar Exercises: Prone   Single Arm Raise 10 reps  cues initially   Straight Leg Raise 10 reps   Opposite Arm/Leg Raise 10 reps   Other Prone Lumbar Exercises head shoulders lift.  Unable to keep neck neutral.       Shoulder Exercises: Standing   Flexion 10 reps  back to wall with arm reach,   Extension 10 reps  heavy cues   Theraband Level (Shoulder Extension) Level 2 (Red)   Row 20 reps   Theraband Level (Shoulder Row) Level 2 (Red)  cues shoulder position initially   Other Standing Exercises abduction , back to wall 10 X almost to 90 degrees,  sore shoulders  4/10     Shoulder Exercises: ROM/Strengthening   UBE (Upper Arm Bike) 5 minutes, L1   retro,  slow motions     Shoulder Exercises: Stretch   Corner Stretch 3 reps;30 seconds  doorway     Ankle Exercises: Stretches   Gastroc Stretch 3 reps;30 seconds  incline board                  PT Short Term Goals - 08/30/16 0845      PT SHORT TERM GOAL #1   Title Pt will be independent with her HEP.    Baseline independent   Time 3   Period Weeks   Status Achieved     PT SHORT TERM GOAL #2   Title Pt able to report no pain during the day with daily activities in order to improve function.    Baseline lifhr activities, no pain   Time 3   Period Weeks   Status Partially Met           PT Long Term Goals - 08/17/16 1307      PT LONG TERM GOAL #1   Title Pt will improve FOTO score from 44% limitation to <35% limitation to  improve overall functional mobility.   Baseline 44% limitation   Time 6   Period Weeks   Status New     PT LONG TERM GOAL #2   Title Pt will improve bilateral LE strength to grossly 5/5 in hips and knees order to improve functional mobility.   Baseline Grossly -4/5 in bilateral LE's hip and knees   Time 6   Period Weeks   Status New               Plan - 08/30/16 6219    Clinical Impression Statement No pain for last several days.  She kept activities light.  Progress toward pain goals.  Able to get neutral spine with door way stretch. STG#1 met,  STG#2 partially met.   PT Next Visit Plan door way stretch for HEP.  Work toward goals.    PT Home Exercise Plan contoinue   Consulted and Agree with Plan of Care Patient      Patient will benefit from skilled therapeutic intervention in order to improve the following deficits and impairments:  Decreased activity tolerance, Impaired perceived functional ability, Pain, Improper body mechanics, Postural dysfunction, Decreased balance, Decreased strength, Decreased mobility, Impaired UE functional use  Visit Diagnosis: Pain in thoracic spine  Muscle weakness  (generalized)     Problem List Patient Active Problem List   Diagnosis Date Noted  . Shoulder pain 12/23/2015  . Hx of adenomatous polyp of colon 03/06/2015  . Back pain 12/06/2014  . Routine general medical examination at a health care facility 12/06/2014    [facility] 08/30/2016, 8:46 AM  [place], [place], [address] Phone: [phone]   Fax:  [phone]  Name: Anita [name] MRN: [id number] Date of Birth: 05-05-1946  Melvenia Needles, PTA 08/30/16 8:46 AM Phone: [phone] Fax: [phone]

## 2016-09-02 ENCOUNTER — Ambulatory Visit: Payer: Commercial Managed Care - HMO | Admitting: Physical Therapy

## 2016-09-02 DIAGNOSIS — M6281 Muscle weakness (generalized): Secondary | ICD-10-CM | POA: Diagnosis not present

## 2016-09-02 DIAGNOSIS — M546 Pain in thoracic spine: Secondary | ICD-10-CM | POA: Diagnosis not present

## 2016-09-02 NOTE — Therapy (Signed)
[place], [place], [address] Phone: [phone]   Fax:  [phone]  Physical Therapy Treatment  Patient Details  Name: OTHA RICKLES MRN: [id number] Date of Birth: [birth year] Referring Provider: Zollie Beckers, MD  Encounter Date: 09/02/2016      PT End of Session - 09/02/16 0844    Visit Number 5   Number of Visits 12   Date for PT Re-Evaluation 10/17/16   PT Start Time 0801   PT Stop Time 0844   PT Time Calculation (min) 43 min   Activity Tolerance Patient tolerated treatment well   Behavior During Therapy [facility] for tasks assessed/performed      Past Medical History:  Diagnosis Date  . Allergy   . Arthritis   . Cataract    bilateral  . Dry eyes   . Hx of adenomatous polyp of colon 03/06/2015    Past Surgical History:  Procedure Laterality Date  . FOOT SURGERY    . HEMORRHOID SURGERY    . TUBAL LIGATION      There were no vitals filed for this visit.      Subjective Assessment - 09/02/16 0805    Subjective Doing pretty good.  No pain now.  Yesterday I did more with my hands and noticed a little tightness on my left side.  I did mt exercises yesterday morning and they felt good.     Currently in Pain? No/denies   Pain Location Thoracic   Pain Orientation Mid;Left   Pain Descriptors / Indicators Tightness   Pain Frequency Intermittent   Aggravating Factors  using arms   Pain Relieving Factors rest, exercises   Multiple Pain Sites No                         [facility] Adult PT Treatment/Exercise - 09/02/16 0001      Neck Exercises: Supine   Other Supine Exercise Cervicle Stabilization series with head press, 1 pound barbells 10 X each   Other Supine Exercise ER green 10 x2     Lumbar Exercises: Aerobic   Stationary Bike Nusrep l3, 6 minutes, arms/legs.     Shoulder Exercises: Supine   Other Supine Exercises extension 10 X2 green band,  PTA holding   Other Supine  Exercises serratus punch 10 X 2 sets, 0 and 1 pound AA initially.      Shoulder Exercises: Standing   Other Standing Exercises 1 pound shelf reach with rt/lt 5 X each,  cues, fatigue.    Other Standing Exercises Wall push up 20 X     Shoulder Exercises: ROM/Strengthening   Cybex Row 10 reps;2 plate  3 sets   Other ROM/Strengthening Exercises lat pull down 10 pounnds  cues posture                  PT Short Term Goals - 08/30/16 0845      PT SHORT TERM GOAL #1   Title Pt will be independent with her HEP.    Baseline independent   Time 3   Period Weeks   Status Achieved     PT SHORT TERM GOAL #2   Title Pt able to report no pain during the day with daily activities in order to improve function.    Baseline lifhr activities, no pain   Time 3   Period Weeks   Status Partially Met           PT  Long Term Goals - 08/17/16 1307      PT LONG TERM GOAL #1   Title Pt will improve FOTO score from 44% limitation to <35% limitation to improve overall functional mobility.   Baseline 44% limitation   Time 6   Period Weeks   Status New     PT LONG TERM GOAL #2   Title Pt will improve bilateral LE strength to grossly 5/5 in hips and knees order to improve functional mobility.   Baseline Grossly -4/5 in bilateral LE's hip and knees   Time 6   Period Weeks   Status New               Plan - 09/02/16 0845    Clinical Impression Statement Fatigue with home taske.  She continues to not have pain.  Focus today on stabilization/endurance.  No new goals met.  Posture improving with standing, needs reminders with sitting posture.    PT Next Visit Plan stabilization   PT Home Exercise Plan continue   Consulted and Agree with Plan of Care Patient      Patient will benefit from skilled therapeutic intervention in order to improve the following deficits and impairments:  Decreased activity tolerance, Impaired perceived functional ability, Pain, Improper body mechanics,  Postural dysfunction, Decreased balance, Decreased strength, Decreased mobility, Impaired UE functional use  Visit Diagnosis: Pain in thoracic spine  Muscle weakness (generalized)     Problem List Patient Active Problem List   Diagnosis Date Noted  . Shoulder pain 12/23/2015  . Hx of adenomatous polyp of colon 03/06/2015  . Back pain 12/06/2014  . Routine general medical examination at a health care facility 12/06/2014    [facility]  PTA 09/02/2016, 8:47 AM  [place], [place], [address] Phone: [phone]   Fax:  [phone]  Name: MEIAH ZAMUDIO MRN: [id number] Date of Birth: [birth year]

## 2016-09-03 ENCOUNTER — Encounter: Payer: Self-pay | Admitting: Internal Medicine

## 2016-09-03 ENCOUNTER — Other Ambulatory Visit (INDEPENDENT_AMBULATORY_CARE_PROVIDER_SITE_OTHER): Payer: Commercial Managed Care - HMO

## 2016-09-03 ENCOUNTER — Ambulatory Visit (INDEPENDENT_AMBULATORY_CARE_PROVIDER_SITE_OTHER): Payer: Commercial Managed Care - HMO | Admitting: Internal Medicine

## 2016-09-03 VITALS — BP 130/82 | HR 87 | Temp 97.7°F | Ht 62.5 in | Wt 120.5 lb

## 2016-09-03 DIAGNOSIS — M546 Pain in thoracic spine: Secondary | ICD-10-CM | POA: Diagnosis not present

## 2016-09-03 DIAGNOSIS — Z1159 Encounter for screening for other viral diseases: Secondary | ICD-10-CM

## 2016-09-03 DIAGNOSIS — Z23 Encounter for immunization: Secondary | ICD-10-CM

## 2016-09-03 DIAGNOSIS — Z720 Tobacco use: Secondary | ICD-10-CM

## 2016-09-03 DIAGNOSIS — Z0000 Encounter for general adult medical examination without abnormal findings: Secondary | ICD-10-CM

## 2016-09-03 DIAGNOSIS — J410 Simple chronic bronchitis: Secondary | ICD-10-CM | POA: Insufficient documentation

## 2016-09-03 LAB — COMPREHENSIVE METABOLIC PANEL
ALBUMIN: 3.9 g/dL (ref 3.5–5.2)
ALK PHOS: 90 U/L (ref 39–117)
ALT: 42 U/L — AB (ref 0–35)
AST: 33 U/L (ref 0–37)
BUN: 30 mg/dL — AB (ref 6–23)
CO2: 24 mEq/L (ref 19–32)
CREATININE: 0.99 mg/dL (ref 0.40–1.20)
Calcium: 9.6 mg/dL (ref 8.4–10.5)
Chloride: 108 mEq/L (ref 96–112)
GFR: 71.73 mL/min (ref >60.00)
Glucose, Bld: 83 mg/dL (ref 70–99)
Potassium: 4.7 mEq/L (ref 3.5–5.1)
SODIUM: 138 meq/L (ref 135–145)
TOTAL PROTEIN: 8.5 g/dL — AB (ref 6.0–8.3)
Total Bilirubin: 0.3 mg/dL (ref 0.2–1.2)

## 2016-09-03 LAB — CBC
HCT: 30.4 % — ABNORMAL LOW (ref 36.0–46.0)
Hemoglobin: 10.3 g/dL — ABNORMAL LOW (ref 12.0–15.0)
MCHC: 33.8 g/dL (ref 30.0–36.0)
MCV: 79.9 fl (ref 78.0–100.0)
PLATELETS: 209 10*3/uL (ref 150.0–400.0)
RBC: 3.8 Mil/uL — ABNORMAL LOW (ref 3.87–5.11)
RDW: 20.7 % — AB (ref 11.5–15.5)
WBC: 5.5 10*3/uL (ref 4.0–10.5)

## 2016-09-03 LAB — LIPID PANEL
CHOL/HDL RATIO: 4
CHOLESTEROL: 184 mg/dL (ref 0–200)
HDL: 51.6 mg/dL (ref >39.00)
LDL Cholesterol: 105 mg/dL — ABNORMAL HIGH (ref 0–99)
NONHDL: 132.54
Triglycerides: 140 mg/dL (ref 0.0–149.0)
VLDL: 28 mg/dL (ref 0.0–40.0)

## 2016-09-03 LAB — HEMOGLOBIN A1C: HEMOGLOBIN A1C: 5 % (ref 4.6–6.5)

## 2016-09-03 MED ORDER — BUPROPION HCL ER (SMOKING DET) 150 MG PO TB12: 150.0000 mg | ORAL_TABLET | Freq: Two times a day (BID) | ORAL | 0 refills | Status: DC

## 2016-09-03 NOTE — Progress Notes (Signed)
Pre visit review using our clinic review tool, if applicable. No additional management support is needed unless otherwise documented below in the visit note. 

## 2016-09-03 NOTE — Assessment & Plan Note (Signed)
Rx for zyban today, talked to her about the need to quit in the first month and then take for 3 months.

## 2016-09-03 NOTE — Assessment & Plan Note (Signed)
Given tdap, prevnar 13, flu shot given at visit. Rx for zyban to stop smoking. Checking labs and adjust as needed. BP normal. Counseled on sun safety and mole surveillance. Counseled on the dangers of distracted driving. Given 10 year screening recommendations.

## 2016-09-03 NOTE — Assessment & Plan Note (Signed)
Stable and following with PT and ortho.

## 2016-09-03 NOTE — Patient Instructions (Addendum)
We have sent in the medicine wellbutrin (called zyban for better insurance coverage). Take 1 pill daily for 1 week, then increase to 1 pill twice a day. Keep taking 1 pill twice a day for 3 months.   You need to commit to stopping smoking in the first 1 month of the medicine. Take it the whole 3 months to increase your changes of stopping for good.   Smoking Cessation, Tips for Success If you are ready to quit smoking, congratulations! You have chosen to help yourself be healthier. Cigarettes bring nicotine, tar, carbon monoxide, and other irritants into your body. Your lungs, heart, and blood vessels will be able to work better without these poisons. There are many different ways to quit smoking. Nicotine gum, nicotine patches, a nicotine inhaler, or nicotine nasal spray can help with physical craving. Hypnosis, support groups, and medicines help break the habit of smoking. WHAT THINGS CAN I DO TO MAKE QUITTING EASIER?  Here are some tips to help you quit for good:  Pick a date when you will quit smoking completely. Tell all of your friends and family about your plan to quit on that date.  Do not try to slowly cut down on the number of cigarettes you are smoking. Pick a quit date and quit smoking completely starting on that day.  Throw away all cigarettes.   Clean and remove all ashtrays from your home, work, and car.  On a card, write down your reasons for quitting. Carry the card with you and read it when you get the urge to smoke.  Cleanse your body of nicotine. Drink enough water and fluids to keep your urine clear or pale yellow. Do this after quitting to flush the nicotine from your body.  Learn to predict your moods. Do not let a bad situation be your excuse to have a cigarette. Some situations in your life might tempt you into wanting a cigarette.  Never have "just one" cigarette. It leads to wanting another and another. Remind yourself of your decision to quit.  Change habits  associated with smoking. If you smoked while driving or when feeling stressed, try other activities to replace smoking. Stand up when drinking your coffee. Brush your teeth after eating. Sit in a different chair when you read the paper. Avoid alcohol while trying to quit, and try to drink fewer caffeinated beverages. Alcohol and caffeine may urge you to smoke.  Avoid foods and drinks that can trigger a desire to smoke, such as sugary or spicy foods and alcohol.  Ask people who smoke not to smoke around you.  Have something planned to do right after eating or having a cup of coffee. For example, plan to take a walk or exercise.  Try a relaxation exercise to calm you down and decrease your stress. Remember, you may be tense and nervous for the first 2 weeks after you quit, but this will pass.  Find new activities to keep your hands busy. Play with a pen, coin, or rubber band. Doodle or draw things on paper.  Brush your teeth right after eating. This will help cut down on the craving for the taste of tobacco after meals. You can also try mouthwash.   Use oral substitutes in place of cigarettes. Try using lemon drops, carrots, cinnamon sticks, or chewing gum. Keep them handy so they are available when you have the urge to smoke.  When you have the urge to smoke, try deep breathing.  Designate your home as  a nonsmoking area.  If you are a heavy smoker, ask your health care provider about a prescription for nicotine chewing gum. It can ease your withdrawal from nicotine.  Reward yourself. Set aside the cigarette money you save and buy yourself something nice.  Look for support from others. Join a support group or smoking cessation program. Ask someone at home or at work to help you with your plan to quit smoking.  Always ask yourself, "Do I need this cigarette or is this just a reflex?" Tell yourself, "Today, I choose not to smoke," or "I do not want to smoke." You are reminding yourself of your  decision to quit.  Do not replace cigarette smoking with electronic cigarettes (commonly called e-cigarettes). The safety of e-cigarettes is unknown, and some may contain harmful chemicals.  If you relapse, do not give up! Plan ahead and think about what you will do the next time you get the urge to smoke. HOW WILL I FEEL WHEN I QUIT SMOKING? You may have symptoms of withdrawal because your body is used to nicotine (the addictive substance in cigarettes). You may crave cigarettes, be irritable, feel very hungry, cough often, get headaches, or have difficulty concentrating. The withdrawal symptoms are only temporary. They are strongest when you first quit but will go away within 10-14 days. When withdrawal symptoms occur, stay in control. Think about your reasons for quitting. Remind yourself that these are signs that your body is healing and getting used to being without cigarettes. Remember that withdrawal symptoms are easier to treat than the major diseases that smoking can cause.  Even after the withdrawal is over, expect periodic urges to smoke. However, these cravings are generally short lived and will go away whether you smoke or not. Do not smoke! WHAT RESOURCES ARE AVAILABLE TO HELP ME QUIT SMOKING? Your health care provider can direct you to community resources or hospitals for support, which may include:  Group support.  Education.  Hypnosis.  Therapy.   This information is not intended to replace advice given to you by your health care provider. Make sure you discuss any questions you have with your health care provider.   Document Released: 09/03/2004 Document Revised: 12/27/2014 Document Reviewed: 05/24/2013 Elsevier Interactive Patient Education 2016 [place] Maintenance, Female Adopting a healthy lifestyle and getting preventive care can go a long way to promote health and wellness. Talk with your health care provider about what schedule of regular examinations  is right for you. This is a good chance for you to check in with your provider about disease prevention and staying healthy. In between checkups, there are plenty of things you can do on your own. Experts have done a lot of research about which lifestyle changes and preventive measures are most likely to keep you healthy. Ask your health care provider for more information. WEIGHT AND DIET  Eat a healthy diet  Be sure to include plenty of vegetables, fruits, low-fat dairy products, and lean protein.  Do not eat a lot of foods high in solid fats, added sugars, or salt.  Get regular exercise. This is one of the most important things you can do for your health.  Most adults should exercise for at least 150 minutes each week. The exercise should increase your heart rate and make you sweat (moderate-intensity exercise).  Most adults should also do strengthening exercises at least twice a week. This is in addition to the moderate-intensity exercise.  Maintain a healthy weight  Body  mass index (BMI) is a measurement that can be used to identify possible weight problems. It estimates body fat based on height and weight. Your health care provider can help determine your BMI and help you achieve or maintain a healthy weight.  For females 68 years of age and older:   A BMI below 18.5 is considered underweight.  A BMI of 18.5 to 24.9 is normal.  A BMI of 25 to 29.9 is considered overweight.  A BMI of 30 and above is considered obese.  Watch levels of cholesterol and blood lipids  You should start having your blood tested for lipids and cholesterol at 68 years of age, then have this test every 5 years.  You may need to have your cholesterol levels checked more often if:  Your lipid or cholesterol levels are high.  You are older than 68 years of age.  You are at high risk for heart disease.  CANCER SCREENING   Lung Cancer  Lung cancer screening is recommended for adults 71-80 years  old who are at high risk for lung cancer because of a history of smoking.  A yearly low-dose CT scan of the lungs is recommended for people who:  Currently smoke.  Have quit within the past 15 years.  Have at least a 30-pack-year history of smoking. A pack year is smoking an average of one pack of cigarettes a day for 1 year.  Yearly screening should continue until it has been 15 years since you quit.  Yearly screening should stop if you develop a health problem that would prevent you from having lung cancer treatment.  Breast Cancer  Practice breast self-awareness. This means understanding how your breasts normally appear and feel.  It also means doing regular breast self-exams. Let your health care provider know about any changes, no matter how small.  If you are in your 20s or 30s, you should have a clinical breast exam (CBE) by a health care provider every 1-3 years as part of a regular health exam.  If you are 1 or older, have a CBE every year. Also consider having a breast X-ray (mammogram) every year.  If you have a family history of breast cancer, talk to your health care provider about genetic screening.  If you are at high risk for breast cancer, talk to your health care provider about having an MRI and a mammogram every year.  Breast cancer gene (BRCA) assessment is recommended for women who have family members with BRCA-related cancers. BRCA-related cancers include:  Breast.  Ovarian.  Tubal.  Peritoneal cancers.  Results of the assessment will determine the need for genetic counseling and BRCA1 and BRCA2 testing. Cervical Cancer Your health care provider may recommend that you be screened regularly for cancer of the pelvic organs (ovaries, uterus, and vagina). This screening involves a pelvic examination, including checking for microscopic changes to the surface of your cervix (Pap test). You may be encouraged to have this screening done every 3 years, beginning  at age 26.  For women ages 35-65, health care providers may recommend pelvic exams and Pap testing every 3 years, or they may recommend the Pap and pelvic exam, combined with testing for human papilloma virus (HPV), every 5 years. Some types of HPV increase your risk of cervical cancer. Testing for HPV may also be done on women of any age with unclear Pap test results.  Other health care providers may not recommend any screening for nonpregnant women who are considered low  risk for pelvic cancer and who do not have symptoms. Ask your health care provider if a screening pelvic exam is right for you.  If you have had past treatment for cervical cancer or a condition that could lead to cancer, you need Pap tests and screening for cancer for at least 20 years after your treatment. If Pap tests have been discontinued, your risk factors (such as having a new sexual partner) need to be reassessed to determine if screening should resume. Some women have medical problems that increase the chance of getting cervical cancer. In these cases, your health care provider may recommend more frequent screening and Pap tests. Colorectal Cancer  This type of cancer can be detected and often prevented.  Routine colorectal cancer screening usually begins at 68 years of age and continues through 68 years of age.  Your health care provider may recommend screening at an earlier age if you have risk factors for colon cancer.  Your health care provider may also recommend using home test kits to check for hidden blood in the stool.  A small camera at the end of a tube can be used to examine your colon directly (sigmoidoscopy or colonoscopy). This is done to check for the earliest forms of colorectal cancer.  Routine screening usually begins at age 4.  Direct examination of the colon should be repeated every 5-10 years through 68 years of age. However, you may need to be screened more often if early forms of precancerous  polyps or small growths are found. Skin Cancer  Check your skin from head to toe regularly.  Tell your health care provider about any new moles or changes in moles, especially if there is a change in a mole's shape or color.  Also tell your health care provider if you have a mole that is larger than the size of a pencil eraser.  Always use sunscreen. Apply sunscreen liberally and repeatedly throughout the day.  Protect yourself by wearing long sleeves, pants, a wide-brimmed hat, and sunglasses whenever you are outside. HEART DISEASE, DIABETES, AND HIGH BLOOD PRESSURE   High blood pressure causes heart disease and increases the risk of stroke. High blood pressure is more likely to develop in:  People who have blood pressure in the high end of the normal range (130-139/85-89 mm Hg).  People who are overweight or obese.  People who are African American.  If you are 27-45 years of age, have your blood pressure checked every 3-5 years. If you are 16 years of age or older, have your blood pressure checked every year. You should have your blood pressure measured twice--once when you are at a hospital or clinic, and once when you are not at a hospital or clinic. Record the average of the two measurements. To check your blood pressure when you are not at a hospital or clinic, you can use:  An automated blood pressure machine at a pharmacy.  A home blood pressure monitor.  If you are between 55 years and 75 years old, ask your health care provider if you should take aspirin to prevent strokes.  Have regular diabetes screenings. This involves taking a blood sample to check your fasting blood sugar level.  If you are at a normal weight and have a low risk for diabetes, have this test once every three years after 68 years of age.  If you are overweight and have a high risk for diabetes, consider being tested at a younger age or more often.  PREVENTING INFECTION  Hepatitis B  If you have a  higher risk for hepatitis B, you should be screened for this virus. You are considered at high risk for hepatitis B if:  You were born in a country where hepatitis B is common. Ask your health care provider which countries are considered high risk.  Your parents were born in a high-risk country, and you have not been immunized against hepatitis B (hepatitis B vaccine).  You have HIV or AIDS.  You use needles to inject street drugs.  You live with someone who has hepatitis B.  You have had sex with someone who has hepatitis B.  You get hemodialysis treatment.  You take certain medicines for conditions, including cancer, organ transplantation, and autoimmune conditions. Hepatitis C  Blood testing is recommended for:  Everyone born from [birth year] through 1963.  Anyone with known risk factors for hepatitis C. Sexually transmitted infections (STIs)  You should be screened for sexually transmitted infections (STIs) including gonorrhea and chlamydia if:  You are sexually active and are younger than 68 years of age.  You are older than 68 years of age and your health care provider tells you that you are at risk for this type of infection.  Your sexual activity has changed since you were last screened and you are at an increased risk for chlamydia or gonorrhea. Ask your health care provider if you are at risk.  If you do not have HIV, but are at risk, it may be recommended that you take a prescription medicine daily to prevent HIV infection. This is called pre-exposure prophylaxis (PrEP). You are considered at risk if:  You are sexually active and do not regularly use condoms or know the HIV status of your partner(s).  You take drugs by injection.  You are sexually active with a partner who has HIV. Talk with your health care provider about whether you are at high risk of being infected with HIV. If you choose to begin PrEP, you should first be tested for HIV. You should then be tested  every 3 months for as long as you are taking PrEP.  PREGNANCY   If you are premenopausal and you may become pregnant, ask your health care provider about preconception counseling.  If you may become pregnant, take 400 to 800 micrograms (mcg) of folic acid every day.  If you want to prevent pregnancy, talk to your health care provider about birth control (contraception). OSTEOPOROSIS AND MENOPAUSE   Osteoporosis is a disease in which the bones lose minerals and strength with aging. This can result in serious bone fractures. Your risk for osteoporosis can be identified using a bone density scan.  If you are 94 years of age or older, or if you are at risk for osteoporosis and fractures, ask your health care provider if you should be screened.  Ask your health care provider whether you should take a calcium or vitamin D supplement to lower your risk for osteoporosis.  Menopause may have certain physical symptoms and risks.  Hormone replacement therapy may reduce some of these symptoms and risks. Talk to your health care provider about whether hormone replacement therapy is right for you.  HOME CARE INSTRUCTIONS   Schedule regular health, dental, and eye exams.  Stay current with your immunizations.   Do not use any tobacco products including cigarettes, chewing tobacco, or electronic cigarettes.  If you are pregnant, do not drink alcohol.  If you are breastfeeding, limit how much and how  often you drink alcohol.  Limit alcohol intake to no more than 1 drink per day for nonpregnant women. One drink equals 12 ounces of beer, 5 ounces of wine, or 1 ounces of hard liquor.  Do not use street drugs.  Do not share needles.  Ask your health care provider for help if you need support or information about quitting drugs.  Tell your health care provider if you often feel depressed.  Tell your health care provider if you have ever been abused or do not feel safe at home.   This  information is not intended to replace advice given to you by your health care provider. Make sure you discuss any questions you have with your health care provider.   Document Released: 06/21/2011 Document Revised: 12/27/2014 Document Reviewed: 11/07/2013 Elsevier Interactive Patient Education [employer].

## 2016-09-03 NOTE — Progress Notes (Signed)
   Subjective:    Patient ID: Anita [name], female    DOB: 01-25-1946, 70 y.o.   MRN: [id number]  HPI Here for medicare wellness and CPE, no new complaints. Please see A/P for status and treatment of chronic medical problems. Wants to stop smoking.   Diet: heart healthy Physical activity: sedentary Depression/mood screen: negative Hearing: intact to whispered voice Visual acuity: grossly normal, performs annual eye exam  ADLs: capable Fall risk: none Home safety: good Cognitive evaluation: intact to orientation, naming, recall and repetition EOL planning: adv directives discussed  I have personally reviewed and have noted 1. The patient's medical and social history - reviewed today no changes 2. Their use of alcohol, tobacco or illicit drugs 3. Their current medications and supplements 4. The patient's functional ability including ADL's, fall risks, home safety risks and hearing or visual impairment. 5. Diet and physical activities 6. Evidence for depression or mood disorders 7. Care team reviewed and updated (available in snapshot)  Review of Systems  Constitutional: Negative for activity change, appetite change, fatigue, fever and unexpected weight change.  HENT: Negative.   Eyes: Negative.   Respiratory: Negative for cough, chest tightness, shortness of breath and wheezing.   Cardiovascular: Negative for chest pain, palpitations and leg swelling.  Gastrointestinal: Negative for abdominal distention, abdominal pain, constipation, diarrhea and nausea.  Musculoskeletal: Positive for back pain. Negative for gait problem.  Skin: Negative.   Neurological: Negative.  Negative for weakness and numbness.  Psychiatric/Behavioral: Negative.       Objective:   Physical Exam  Constitutional: She is oriented to person, place, and time. She appears well-developed and well-nourished.  HENT:  Head: Normocephalic and atraumatic.  Eyes: EOM are normal.  Neck: Normal range of motion.    Cardiovascular: Normal rate and regular rhythm.   Pulmonary/Chest: Effort normal and breath sounds normal. No respiratory distress. She has no wheezes. She has no rales.  Abdominal: Soft. Bowel sounds are normal. She exhibits no distension. There is no tenderness. There is no rebound.  Musculoskeletal: She exhibits no edema.  Neurological: She is alert and oriented to person, place, and time. Coordination normal.  Skin: Skin is warm and dry.  Psychiatric: She has a normal mood and affect.   Vitals:   09/03/16 0919  BP: 130/82  Pulse: 87  Temp: 97.7 F (36.5 C)  TempSrc: Oral  SpO2: 98%  Weight: 120 lb 8 oz (54.7 kg)  Height: 5' 2.5" (1.588 m)      Assessment & Plan:  Flu, tdap, and prevnar 13 given at visit.

## 2016-09-04 LAB — HEPATITIS C ANTIBODY: HCV Ab: NEGATIVE

## 2016-09-06 ENCOUNTER — Ambulatory Visit: Payer: Commercial Managed Care - HMO | Admitting: Physical Therapy

## 2016-09-06 DIAGNOSIS — M6281 Muscle weakness (generalized): Secondary | ICD-10-CM

## 2016-09-06 DIAGNOSIS — M546 Pain in thoracic spine: Secondary | ICD-10-CM | POA: Diagnosis not present

## 2016-09-06 NOTE — Therapy (Signed)
[place], [place], [address] Phone: [phone]   Fax:  [phone]  Physical Therapy Treatment  Patient Details  Name: Anita [name] MRN: [id number] Date of Birth: [birth year] Referring Provider: Zollie Beckers, MD  Encounter Date: 09/06/2016      PT End of Session - 09/06/16 1815    Visit Number 6   Number of Visits 12   Date for PT Re-Evaluation 10/17/16   PT Start Time 0800   PT Stop Time 0845   PT Time Calculation (min) 45 min   Activity Tolerance Patient tolerated treatment well   Behavior During Therapy [facility] for tasks assessed/performed      Past Medical History:  Diagnosis Date  . Allergy   . Arthritis   . Cataract    bilateral  . Dry eyes   . Hx of adenomatous polyp of colon 03/06/2015    Past Surgical History:  Procedure Laterality Date  . FOOT SURGERY    . HEMORRHOID SURGERY    . TUBAL LIGATION      There were no vitals filed for this visit.      Subjective Assessment - 09/06/16 0801    Subjective Continues with no pain   Pain Score 0-No pain   Pain Location Thoracic            [facility] PT Assessment - 09/06/16 0001      Strength   Right Hip ABduction 4/5   Left Hip ABduction 4-/5                     [facility] Adult PT Treatment/Exercise - 09/06/16 0001      Lumbar Exercises: Aerobic   Stationary Bike Nustep, Level 6 X 4 minutes.     Knee/Hip Exercises: Machines for Strengthening   Total Gym Leg Press 1 plate,  shoulder width,  wide base and narrow base, 10 X each,  shakey, vs pain     Knee/Hip Exercises: Standing   Heel Raises 2 sets;10 reps   Terminal Knee Extension 20 reps;Both   Terminal Knee Extension Limitations pillow at wall     Knee/Hip Exercises: Seated   Sit to Sand 5 reps  noted knee hyperextension with standing and hip IR     Knee/Hip Exercises: Supine   Bridges 20 reps     Knee/Hip Exercises: Sidelying   Clams 20  Both, cues  initially     Shoulder Exercises: ROM/Strengthening   UBE (Upper Arm Bike) 4 minutes  2 minutes each way.                   PT Short Term Goals - 09/06/16 1817      PT SHORT TERM GOAL #1   Title Pt will be independent with her HEP.    Baseline independent   Time 3   Period Weeks   Status Achieved     PT SHORT TERM GOAL #2   Title Pt able to report no pain during the day with daily activities in order to improve function.    Time 3   Period Weeks   Status Unable to assess           PT Long Term Goals - 09/06/16 1817      PT LONG TERM GOAL #1   Title Pt will improve FOTO score from 44% limitation to <35% limitation to improve overall functional mobility.   Time 6   Period Weeks   Status  Unable to assess     PT LONG TERM GOAL #2   Title Pt will improve bilateral LE strength to grossly 5/5 in hips and knees order to improve functional mobility.   Time 6   Period Weeks   Status Unable to assess               Plan - 09/06/16 1815    Clinical Impression Statement Able to progress difficulty of exercises in gym. Tends to walk with hips IR.  Functional use of UE's improving.   PT Next Visit Plan stabilization   PT Home Exercise Plan continue   Consulted and Agree with Plan of Care Patient      Patient will benefit from skilled therapeutic intervention in order to improve the following deficits and impairments:  Decreased activity tolerance, Impaired perceived functional ability, Pain, Improper body mechanics, Postural dysfunction, Decreased balance, Decreased strength, Decreased mobility, Impaired UE functional use  Visit Diagnosis: Pain in thoracic spine  Muscle weakness (generalized)     Problem List Patient Active Problem List   Diagnosis Date Noted  . Tobacco abuse 09/03/2016  . Shoulder pain 12/23/2015  . Hx of adenomatous polyp of colon 03/06/2015  . Back pain 12/06/2014  . Routine general medical examination at a health care facility  12/06/2014    [facility] PTA 09/06/2016, 6:18 PM  [facility] [address] [place], [place], [address] Phone: [phone]   Fax:  [phone]  Name: Anita [name] MRN: [id number] Date of Birth: 1946-02-18

## 2016-09-08 ENCOUNTER — Encounter: Payer: Self-pay | Admitting: Physical Therapy

## 2016-09-13 ENCOUNTER — Ambulatory Visit: Payer: Commercial Managed Care - HMO | Admitting: Physical Therapy

## 2016-09-13 DIAGNOSIS — M546 Pain in thoracic spine: Secondary | ICD-10-CM | POA: Diagnosis not present

## 2016-09-13 DIAGNOSIS — M6281 Muscle weakness (generalized): Secondary | ICD-10-CM

## 2016-09-13 NOTE — Patient Instructions (Signed)

## 2016-09-13 NOTE — Therapy (Addendum)
[place], [place], [address] Phone: [phone]   Fax:  [phone]  Physical Therapy Treatment/Discharge Summary  Patient Details  Name: Anita [name] MRN: [id number] Date of Birth: September 11, 1946 Referring Provider: Zollie Beckers, MD  Encounter Date: 09/13/2016      PT End of Session - 09/13/16 0958    Visit Number 7   Number of Visits 12   Date for PT Re-Evaluation 10/17/16   PT Start Time 0801   PT Stop Time 0845   PT Time Calculation (min) 44 min   Activity Tolerance Patient tolerated treatment well   Behavior During Therapy [facility] for tasks assessed/performed      Past Medical History:  Diagnosis Date  . Allergy   . Arthritis   . Cataract    bilateral  . Dry eyes   . Hx of adenomatous polyp of colon 03/06/2015    Past Surgical History:  Procedure Laterality Date  . FOOT SURGERY    . HEMORRHOID SURGERY    . TUBAL LIGATION      There were no vitals filed for this visit.      Subjective Assessment - 09/13/16 0806    Subjective My right side is sore from cutting braches in the yard.    Currently in Pain? Yes   Pain Score --  mild   Pain Location --  ribs   Pain Orientation Right   Aggravating Factors  yardwork   Pain Relieving Factors rest   Multiple Pain Sites No                         [facility] Adult PT Treatment/Exercise - 09/13/16 0001      Self-Care   ADL's demonstrated, problem solved groceries, laundry, handout issued.      Lumbar Exercises: Stretches   Pelvic Tilt 5 reps     Lumbar Exercises: Supine   Bent Knee Raise 10 reps   Bent Knee Raise Limitations monitored fpor technique   Bridge 10 reps   Bridge Limitations 2 sets     Lumbar Exercises: Sidelying   Clam --  cues, tactile initially,  pilates style   Clam Limitations 10 x 2 sets     Lumbar Exercises: Prone   Other Prone Lumbar Exercises bent knee lift 10 x each legt   Other Prone Lumbar  Exercises knee flexion 20 X 0 LBS.  (Cramp and 5 pounds too heave     Knee/Hip Exercises: Standing   Terminal Knee Extension 2 sets;10 reps   Terminal Knee Extension Limitations prone     Shoulder Exercises: Supine   Other Supine Exercises 2 LBS stabilization circles,  horizontal abd/ add(smaller range) and flexion/extension (Smallwe range ) 10 x each.  This was a challange     Shoulder Exercises: ROM/Strengthening   UBE (Upper Arm Bike) 4 minutes  2 minutes each way.                 PT Education - 09/13/16 0957    Education provided Yes   Education Details ADL   Person(s) Educated Patient   Methods Explanation;Demonstration;Verbal cues;Tactile cues;Handout   Comprehension Verbalized understanding;Returned demonstration          PT Short Term Goals - 09/13/16 0959      PT SHORT TERM GOAL #1   Title Pt will be independent with her HEP.    Baseline independent   Time 3   Period Weeks  Status Achieved     PT SHORT TERM GOAL #2   Title Pt able to report no pain during the day with daily activities in order to improve function.    Baseline She says pain with ADL's is up to 10/10   Time 3   Period Weeks   Status On-going           PT Long Term Goals - 09/13/16 1000      PT LONG TERM GOAL #1   Title Pt will improve FOTO score from 44% limitation to <35% limitation to improve overall functional mobility.   Time 6   Period Weeks   Status Unable to assess     PT LONG TERM GOAL #2   Title Pt will improve bilateral LE strength to grossly 5/5 in hips and knees order to improve functional mobility.   Baseline Grossly -4/5 in bilateral LE's hip and knees   Time 6   Period Weeks   Status On-going               Plan - 09/13/16 1000    Clinical Impression Statement Leg strength improving.  She is able to do yardwork however it makes her sore at waist, right.  Education on ADL focus today to help decrease perception of pain with daily activities.  No pain  at end of session.  Patient declines the need of modalities.    PT Next Visit Plan answer any ADL/ body mechanics questions.  Continue stabilation   PT Home Exercise Plan continue.  Do not allow pain above a 5/10 with ADL's.  Allow enough time for tasks for rests as needed.   Consulted and Agree with Plan of Care Patient      Patient will benefit from skilled therapeutic intervention in order to improve the following deficits and impairments:  Decreased activity tolerance, Impaired perceived functional ability, Pain, Improper body mechanics, Postural dysfunction, Decreased balance, Decreased strength, Decreased mobility, Impaired UE functional use  Visit Diagnosis: Pain in thoracic spine  Muscle weakness (generalized)  PHYSICAL THERAPY DISCHARGE SUMMARY  Visits from Start of Care: 7  Current functional level related to goals / functional outcomes: See above   Remaining deficits: See above   Education / Equipment: HEP, pt education Plan: Patient agrees to discharge.  Patient goals were not met. Patient is being discharged due to the physician's request.  ?????    Pt called to cancel her upcoming PT appointments and reported she was released from PT by her MD.    Problem List Patient Active Problem List   Diagnosis Date Noted  . Tobacco abuse 09/03/2016  . Shoulder pain 12/23/2015  . Hx of adenomatous polyp of colon 03/06/2015  . Back pain 12/06/2014  . Routine general medical examination at a health care facility 12/06/2014    [facility] PTA 09/13/2016, 10:04 AM  [place], [place], [address] Phone: [phone]   Fax:  [phone]  Name: Anita [name] MRN: [id number] Date of Birth: 1946-05-08  Kearney Hard, PT 09/16/16 2:30 PM

## 2016-09-15 ENCOUNTER — Encounter: Payer: Self-pay | Admitting: Physical Therapy

## 2016-09-15 DIAGNOSIS — M546 Pain in thoracic spine: Secondary | ICD-10-CM | POA: Diagnosis not present

## 2016-09-16 ENCOUNTER — Ambulatory Visit: Payer: Commercial Managed Care - HMO | Admitting: Physical Therapy

## 2016-09-16 DIAGNOSIS — M546 Pain in thoracic spine: Secondary | ICD-10-CM | POA: Diagnosis not present

## 2016-09-16 DIAGNOSIS — M6281 Muscle weakness (generalized): Secondary | ICD-10-CM | POA: Diagnosis not present

## 2016-09-20 ENCOUNTER — Ambulatory Visit: Payer: Commercial Managed Care - HMO | Admitting: Physical Therapy

## 2016-09-22 ENCOUNTER — Ambulatory Visit: Payer: Commercial Managed Care - HMO | Admitting: Physical Therapy

## 2016-12-20 HISTORY — PX: CATARACT EXTRACTION W/ INTRAOCULAR LENS IMPLANT: SHX1309

## 2017-04-03 ENCOUNTER — Telehealth: Payer: Self-pay | Admitting: Pulmonary Disease

## 2017-04-03 NOTE — Telephone Encounter (Signed)
Ms. Devery called me to let me know that she had some blood in her stool and feels week.  I recommend ER.

## 2017-08-11 DIAGNOSIS — A499 Bacterial infection, unspecified: Secondary | ICD-10-CM | POA: Diagnosis not present

## 2017-09-23 ENCOUNTER — Encounter: Payer: Self-pay | Admitting: Internal Medicine

## 2017-09-23 DIAGNOSIS — J3089 Other allergic rhinitis: Secondary | ICD-10-CM

## 2017-10-07 DIAGNOSIS — B370 Candidal stomatitis: Secondary | ICD-10-CM | POA: Diagnosis not present

## 2017-10-07 DIAGNOSIS — J320 Chronic maxillary sinusitis: Secondary | ICD-10-CM | POA: Diagnosis not present

## 2017-10-07 DIAGNOSIS — J370 Chronic laryngitis: Secondary | ICD-10-CM | POA: Diagnosis not present

## 2017-10-07 DIAGNOSIS — H6121 Impacted cerumen, right ear: Secondary | ICD-10-CM | POA: Diagnosis not present

## 2017-10-07 DIAGNOSIS — J039 Acute tonsillitis, unspecified: Secondary | ICD-10-CM | POA: Diagnosis not present

## 2017-10-10 ENCOUNTER — Encounter: Payer: Self-pay | Admitting: Internal Medicine

## 2017-10-19 DIAGNOSIS — J370 Chronic laryngitis: Secondary | ICD-10-CM | POA: Diagnosis not present

## 2017-10-19 DIAGNOSIS — J320 Chronic maxillary sinusitis: Secondary | ICD-10-CM | POA: Diagnosis not present

## 2017-10-19 DIAGNOSIS — J322 Chronic ethmoidal sinusitis: Secondary | ICD-10-CM | POA: Diagnosis not present

## 2018-05-22 ENCOUNTER — Other Ambulatory Visit: Payer: Self-pay | Admitting: Internal Medicine

## 2018-05-22 DIAGNOSIS — R921 Mammographic calcification found on diagnostic imaging of breast: Secondary | ICD-10-CM

## 2018-06-28 ENCOUNTER — Encounter: Payer: Self-pay | Admitting: Internal Medicine

## 2018-06-28 ENCOUNTER — Other Ambulatory Visit (INDEPENDENT_AMBULATORY_CARE_PROVIDER_SITE_OTHER): Payer: Medicare HMO

## 2018-06-28 ENCOUNTER — Ambulatory Visit
Admission: RE | Admit: 2018-06-28 | Discharge: 2018-06-28 | Disposition: A | Discharge status: Home / Self Care | Payer: Medicare HMO | Source: Ambulatory Visit | Attending: Internal Medicine | Admitting: Internal Medicine

## 2018-06-28 ENCOUNTER — Ambulatory Visit (INDEPENDENT_AMBULATORY_CARE_PROVIDER_SITE_OTHER): Payer: Medicare HMO | Admitting: Internal Medicine

## 2018-06-28 VITALS — BP 150/74 | HR 84 | Ht 62.5 in | Wt 116.0 lb

## 2018-06-28 DIAGNOSIS — R921 Mammographic calcification found on diagnostic imaging of breast: Secondary | ICD-10-CM

## 2018-06-28 DIAGNOSIS — Z720 Tobacco use: Secondary | ICD-10-CM

## 2018-06-28 DIAGNOSIS — I739 Peripheral vascular disease, unspecified: Secondary | ICD-10-CM

## 2018-06-28 DIAGNOSIS — G8929 Other chronic pain: Secondary | ICD-10-CM

## 2018-06-28 DIAGNOSIS — D179 Benign lipomatous neoplasm, unspecified: Secondary | ICD-10-CM | POA: Diagnosis not present

## 2018-06-28 DIAGNOSIS — M546 Pain in thoracic spine: Secondary | ICD-10-CM | POA: Diagnosis not present

## 2018-06-28 DIAGNOSIS — F17210 Nicotine dependence, cigarettes, uncomplicated: Secondary | ICD-10-CM

## 2018-06-28 DIAGNOSIS — M542 Cervicalgia: Secondary | ICD-10-CM

## 2018-06-28 LAB — LIPID PANEL
CHOLESTEROL: 199 mg/dL (ref 0–200)
HDL: 77 mg/dL (ref >39.00)
LDL CALC: 96 mg/dL (ref 0–99)
NonHDL: 121.58
Total CHOL/HDL Ratio: 3
Triglycerides: 126 mg/dL (ref 0.0–149.0)
VLDL: 25.2 mg/dL (ref 0.0–40.0)

## 2018-06-28 LAB — COMPREHENSIVE METABOLIC PANEL
ALBUMIN: 4.1 g/dL (ref 3.5–5.2)
ALT: 44 U/L — ABNORMAL HIGH (ref 0–35)
AST: 42 U/L — AB (ref 0–37)
Alkaline Phosphatase: 73 U/L (ref 39–117)
BUN: 38 mg/dL — AB (ref 6–23)
CHLORIDE: 109 meq/L (ref 96–112)
CO2: 25 mEq/L (ref 19–32)
CREATININE: 1.81 mg/dL — AB (ref 0.40–1.20)
Calcium: 9.9 mg/dL (ref 8.4–10.5)
GFR: 35.56 mL/min — ABNORMAL LOW (ref >60.00)
GLUCOSE: 87 mg/dL (ref 70–99)
POTASSIUM: 4.9 meq/L (ref 3.5–5.1)
SODIUM: 140 meq/L (ref 135–145)
TOTAL PROTEIN: 8 g/dL (ref 6.0–8.3)
Total Bilirubin: 0.3 mg/dL (ref 0.2–1.2)

## 2018-06-28 LAB — CBC
Hemoglobin: 9 g/dL — ABNORMAL LOW (ref 12.0–15.0)
MCHC: 34.1 g/dL (ref 30.0–36.0)
MCV: 87.2 fl (ref 78.0–100.0)
Platelets: 226 10*3/uL (ref 150.0–400.0)
RBC: 3.01 Mil/uL — AB (ref 3.87–5.11)
RDW: 19.7 % — AB (ref 11.5–15.5)
WBC: 5.6 10*3/uL (ref 4.0–10.5)

## 2018-06-28 LAB — TSH: TSH: 12.71 u[IU]/mL — AB (ref 0.35–4.50)

## 2018-06-28 LAB — CK: CK TOTAL: 81 U/L (ref 7–177)

## 2018-06-28 LAB — VITAMIN D 25 HYDROXY (VIT D DEFICIENCY, FRACTURES): VITD: 20.63 ng/mL — AB (ref 30.00–100.00)

## 2018-06-28 LAB — MAGNESIUM: Magnesium: 1.8 mg/dL (ref 1.5–2.5)

## 2018-06-28 LAB — FERRITIN: FERRITIN: 30.4 ng/mL (ref 10.0–291.0)

## 2018-06-28 MED ORDER — SULFAMETHOXAZOLE-TRIMETHOPRIM 800-160 MG PO TABS: 1.0000 | ORAL_TABLET | Freq: Two times a day (BID) | ORAL | 0 refills | Status: DC

## 2018-06-28 NOTE — Patient Instructions (Signed)
We will get you in with the surgeon to check the lipoma.   We have sent in an antibiotic called bactrim to take 1 pill twice a day for 1 week for the spot on your neck.  We are checking blood work and an ultrasound of the legs to check the blood flow.

## 2018-06-28 NOTE — Progress Notes (Signed)
   Subjective:    Patient ID: Anita [name], female    DOB: 02-21-1946, 72 y.o.   MRN: [id number]  HPI The patient is a 72 YO female coming in for several concerns including neck pain (has a cyst on her neck which has been present for some time, increasing in size the last several weeks and hurting, now draining out some stuff and still hurting, has not tried anything for it), and back pain (chronic, unchanged in intensity, she feels she has a lipoma or something along her right side which rubs and hurts and she wants to talk to a surgeon about it, it is growing in the last several years) and leg pain with walking (when she walks about 2 blocks she gets pain in the calves and feet, she has to stop walking for it to improve, after about 5 minutes rest she improves, does not come at rest, some more muscle cramps in her feet than usual lately, has not taken anything for it, is a smoker).   Review of Systems  Constitutional: Positive for activity change. Negative for appetite change, fatigue, fever and unexpected weight change.  HENT: Negative.   Eyes: Negative.   Respiratory: Negative for cough, chest tightness and shortness of breath.   Cardiovascular: Negative for chest pain, palpitations and leg swelling.  Gastrointestinal: Negative for abdominal distention, abdominal pain, constipation, diarrhea, nausea and vomiting.  Musculoskeletal: Positive for arthralgias, back pain, gait problem and myalgias.  Skin: Negative.   Neurological: Negative for dizziness, tremors, weakness, light-headedness, numbness and headaches.  Psychiatric/Behavioral: Negative.       Objective:   Physical Exam  Constitutional: She is oriented to person, place, and time. She appears well-developed and well-nourished.  HENT:  Head: Normocephalic and atraumatic.  Eyes: EOM are normal.  Neck: Normal range of motion.  Cardiovascular: Normal rate and regular rhythm.  Pulmonary/Chest: Effort normal and breath sounds normal.  No respiratory distress. She has no wheezes. She has no rales.  Abdominal: Soft. Bowel sounds are normal. She exhibits no distension. There is no tenderness. There is no rebound.  Musculoskeletal: She exhibits tenderness. She exhibits no edema.  Neurological: She is alert and oriented to person, place, and time. Coordination normal.  Skin: Skin is warm and dry.  Patient states a lump on her left side however she cannot locate it currently and I could not detect this on exam today, infected cyst on the posterior neck which is freely draining some yellow serous material about 1 cm diameter circular.  Psychiatric: She has a normal mood and affect.   Vitals:   06/28/18 1019  BP: (!) 150/74  Pulse: 84  SpO2: 99%  Weight: 116 lb (52.6 kg)  Height: 5' 2.5" (1.588 m)      Assessment & Plan:

## 2018-06-29 DIAGNOSIS — M542 Cervicalgia: Secondary | ICD-10-CM | POA: Insufficient documentation

## 2018-06-29 DIAGNOSIS — D179 Benign lipomatous neoplasm, unspecified: Secondary | ICD-10-CM | POA: Insufficient documentation

## 2018-06-29 DIAGNOSIS — I739 Peripheral vascular disease, unspecified: Secondary | ICD-10-CM | POA: Insufficient documentation

## 2018-06-29 NOTE — Assessment & Plan Note (Signed)
Infected cyst, rx for bactrim 1 week course. Is freely draining on exam today so I and D was not appropriate.

## 2018-06-29 NOTE — Assessment & Plan Note (Signed)
Time spent counseling about tobacco usage: 3 minutes. I have asked about smoking and is smoking same as usual. The patient is advised to quit. The patient is not willing to quit. They would like to try to quit in the next 6 months. We will follow up with them in 6 months. 

## 2018-06-29 NOTE — Assessment & Plan Note (Signed)
She does insist that she has a spot but she could not find it today on exam. She wishes referral to general surgeon to talk about removal which is placed today.

## 2018-06-29 NOTE — Assessment & Plan Note (Signed)
Chronic and stable. Given her other acute concerns a new plan of action for her back pain was not established today but we did discuss returning to a back specialist and she will consider.

## 2018-06-29 NOTE — Assessment & Plan Note (Signed)
New symptom which is concerning for PVD given her long history of smoking. Ordered arterial doppler to examine blood flow. Counseled about smoking cessation but she did not feel able to quit today.

## 2018-06-30 ENCOUNTER — Other Ambulatory Visit: Payer: Self-pay | Admitting: Internal Medicine

## 2018-06-30 DIAGNOSIS — E1022 Type 1 diabetes mellitus with diabetic chronic kidney disease: Secondary | ICD-10-CM

## 2018-06-30 DIAGNOSIS — N184 Chronic kidney disease, stage 4 (severe): Secondary | ICD-10-CM

## 2018-07-05 ENCOUNTER — Ambulatory Visit (HOSPITAL_COMMUNITY)
Admission: RE | Admit: 2018-07-05 | Discharge: 2018-07-05 | Disposition: A | Discharge status: Home / Self Care | Payer: Medicare HMO | Source: Ambulatory Visit | Attending: Cardiovascular Disease | Admitting: Cardiovascular Disease

## 2018-07-05 ENCOUNTER — Other Ambulatory Visit: Payer: Self-pay | Admitting: Internal Medicine

## 2018-07-05 ENCOUNTER — Ambulatory Visit (HOSPITAL_BASED_OUTPATIENT_CLINIC_OR_DEPARTMENT_OTHER)
Admission: RE | Admit: 2018-07-05 | Discharge: 2018-07-05 | Disposition: A | Discharge status: Home / Self Care | Payer: Medicare HMO | Source: Ambulatory Visit | Attending: Internal Medicine | Admitting: Internal Medicine

## 2018-07-05 ENCOUNTER — Encounter (HOSPITAL_COMMUNITY): Payer: Self-pay

## 2018-07-05 DIAGNOSIS — I739 Peripheral vascular disease, unspecified: Secondary | ICD-10-CM

## 2018-07-07 ENCOUNTER — Other Ambulatory Visit: Payer: Self-pay | Admitting: Internal Medicine

## 2018-07-07 DIAGNOSIS — I739 Peripheral vascular disease, unspecified: Secondary | ICD-10-CM

## 2018-07-10 ENCOUNTER — Other Ambulatory Visit: Payer: Self-pay | Admitting: Internal Medicine

## 2018-07-10 DIAGNOSIS — I739 Peripheral vascular disease, unspecified: Secondary | ICD-10-CM

## 2018-07-11 ENCOUNTER — Ambulatory Visit (HOSPITAL_COMMUNITY)
Admission: RE | Admit: 2018-07-11 | Discharge: 2018-07-11 | Disposition: A | Discharge status: Home / Self Care | Payer: Medicare HMO | Source: Ambulatory Visit | Attending: Internal Medicine | Admitting: Internal Medicine

## 2018-07-11 DIAGNOSIS — I739 Peripheral vascular disease, unspecified: Secondary | ICD-10-CM

## 2018-07-12 ENCOUNTER — Encounter (HOSPITAL_COMMUNITY): Payer: Medicare HMO

## 2018-07-27 NOTE — Progress Notes (Deleted)
Subjective:   Anita [name] is a 72 y.o. female who presents for Medicare Annual (Subsequent) preventive examination.  Review of Systems:  No ROS.  Medicare Wellness Visit. Additional risk factors are reflected in the social history.    Sleep patterns: {SX; SLEEP PATTERNS:18802::"feels rested on waking","does not get up to void","gets up *** times nightly to void","sleeps *** hours nightly"}.    Home Safety/Smoke Alarms: Feels safe in home. Smoke alarms in place.  Living environment; residence and Firearm Safety: {Rehab home environment / accessibility:30080::"no firearms","firearms stored safely"}. Seat Belt Safety/Bike Helmet: Wears seat belt.      Objective:     Vitals: There were no vitals taken for this visit.  There is no height or weight on file to calculate BMI.  Advanced Directives 08/17/2016 02/13/2015  Does Patient Have a Medical Advance Directive? No No  Would patient like information on creating a medical advance directive? Yes - Scientist, clinical (histocompatibility and immunogenetics) given -    Tobacco Social History   Tobacco Use  Smoking Status Current Every Day Smoker  . Packs/day: 1.00  . Types: Cigarettes  Smokeless Tobacco Never Used     Ready to quit: Not Answered Counseling given: Not Answered  Past Medical History:  Diagnosis Date  . Allergy   . Arthritis   . Cataract    bilateral  . Dry eyes   . Hx of adenomatous polyp of colon 03/06/2015   Past Surgical History:  Procedure Laterality Date  . FOOT SURGERY    . HEMORRHOID SURGERY    . TUBAL LIGATION     Family History  Problem Relation Age of Onset  . Hypertension Mother   . Arthritis Mother   . Colon cancer Neg Hx   . Esophageal cancer Neg Hx   . Rectal cancer Neg Hx   . Stomach cancer Neg Hx    Social History   Socioeconomic History  . Marital status: Single    Spouse name: Not on file  . Number of children: 2  . Years of education: 12+  . Highest education level: Not on file  Occupational History  .  Occupation: Retired  Scientific laboratory technician  . Financial resource strain: Not on file  . Food insecurity:    Worry: Not on file    Inability: Not on file  . Transportation needs:    Medical: Not on file    Non-medical: Not on file  Tobacco Use  . Smoking status: Current Every Day Smoker    Packs/day: 1.00    Types: Cigarettes  . Smokeless tobacco: Never Used  Substance and Sexual Activity  . Alcohol use: Yes    Alcohol/week: 0.0 standard drinks    Comment: socially  . Drug use: No  . Sexual activity: Not on file  Lifestyle  . Physical activity:    Days per week: Not on file    Minutes per session: Not on file  . Stress: Not on file  Relationships  . Social connections:    Talks on phone: Not on file    Gets together: Not on file    Attends religious service: Not on file    Active member of club or organization: Not on file    Attends meetings of clubs or organizations: Not on file    Relationship status: Not on file  Other Topics Concern  . Not on file  Social History Narrative   Lives at home with her mother.   Right-handed.   Several sodas per day.  Outpatient Encounter Medications as of 07/28/2018  Medication Sig  . gabapentin (NEURONTIN) 100 MG capsule Take 100 mg by mouth at bedtime.  . meloxicam (MOBIC) 7.5 MG tablet take 1 tablet by mouth twice a day if needed for inflammation and pain  . prednisoLONE acetate (PRED FORTE) 1 % ophthalmic suspension Reported on 06/08/2016  . sulfamethoxazole-trimethoprim (BACTRIM DS,SEPTRA DS) 800-160 MG tablet Take 1 tablet by mouth 2 (two) times daily.   No facility-administered encounter medications on file as of 07/28/2018.     Activities of Daily Living No flowsheet data found.  Patient Care Team: [name], MD as PCP - General (Internal Medicine)    Assessment:   This is a routine wellness examination for Anita [name]. Physical assessment deferred to PCP.   Exercise Activities and Dietary recommendations   Diet (meal  preparation, eat out, water intake, caffeinated beverages, dairy products, fruits and vegetables): {Desc; diets:16563}   Goals   None     Fall Risk Fall Risk  06/08/2016  Falls in the past year? No    Depression Screen PHQ 2/9 Scores 06/08/2016  PHQ - 2 Score 0     Cognitive Function        Immunization History  Administered Date(s) Administered  . Influenza, High Dose Seasonal PF 09/03/2016  . Influenza,inj,Quad PF,6+ Mos 12/06/2014  . Influenza-Unspecified 10/07/2017  . Pneumococcal Conjugate-13 09/03/2016  . Pneumococcal Polysaccharide-23 12/06/2014  . Tdap 09/03/2016   Screening Tests Health Maintenance  Topic Date Due  . URINE MICROALBUMIN  08/27/1958  . INFLUENZA VACCINE  07/20/2018  . COLONOSCOPY  02/27/2020  . MAMMOGRAM  06/28/2020  . TETANUS/TDAP  09/03/2026  . DEXA SCAN  Completed  . Hepatitis C Screening  Completed  . PNA vac Low Risk Adult  Completed      Plan:      I have personally reviewed and noted the following in the patient's chart:   . Medical and social history . Use of alcohol, tobacco or illicit drugs  . Current medications and supplements . Functional ability and status . Nutritional status . Physical activity . Advanced directives . List of other physicians . Vitals . Screenings to include cognitive, depression, and falls . Referrals and appointments  In addition, I have reviewed and discussed with patient certain preventive protocols, quality metrics, and best practice recommendations. A written personalized care plan for preventive services as well as general preventive health recommendations were provided to patient.     Michiel Cowboy, RN  07/27/2018

## 2018-07-28 ENCOUNTER — Ambulatory Visit: Payer: Medicare HMO

## 2018-07-31 ENCOUNTER — Telehealth: Payer: Self-pay | Admitting: Internal Medicine

## 2018-07-31 DIAGNOSIS — D171 Benign lipomatous neoplasm of skin and subcutaneous tissue of trunk: Secondary | ICD-10-CM | POA: Diagnosis not present

## 2018-07-31 MED ORDER — TIZANIDINE HCL 2 MG PO TABS: 2.0000 mg | ORAL_TABLET | Freq: Two times a day (BID) | ORAL | 0 refills | Status: DC | PRN

## 2018-07-31 NOTE — Telephone Encounter (Signed)
Sent in tizanidine to try 1 pill twice a day as needed.

## 2018-07-31 NOTE — Telephone Encounter (Signed)
Copied from [place] [phone]. Topic: General - Other >> Jul 31, 2018 11:53 AM Anita [name] wrote: Reason for CRM: Patient calling for muscle relaxer because the surgeon she went to see for the lump under left arm advised her the pain is not due to the lump but a muscle that has been getting tight. Dr. Sharlet Salina referred her for this.

## 2018-07-31 NOTE — Telephone Encounter (Signed)
Called patient and she states that she has tried ice, salonpas, and heating pad. States none of it works and the heating pad makes it hurt worse states make it feel like it is burning.

## 2018-07-31 NOTE — Telephone Encounter (Signed)
Would recommend to try heating pad.

## 2018-09-04 ENCOUNTER — Encounter: Payer: Self-pay | Admitting: Surgery

## 2018-09-04 ENCOUNTER — Other Ambulatory Visit: Payer: Self-pay

## 2018-09-04 ENCOUNTER — Ambulatory Visit (INDEPENDENT_AMBULATORY_CARE_PROVIDER_SITE_OTHER): Payer: Medicare HMO | Admitting: Surgery

## 2018-09-04 VITALS — BP 148/79 | HR 82 | Resp 18 | Ht 62.5 in | Wt 114.0 lb

## 2018-09-04 DIAGNOSIS — I70213 Atherosclerosis of native arteries of extremities with intermittent claudication, bilateral legs: Secondary | ICD-10-CM

## 2018-09-04 MED ORDER — CILOSTAZOL 100 MG PO TABS: 100.0000 mg | ORAL_TABLET | Freq: Two times a day (BID) | ORAL | 11 refills | Status: DC

## 2018-09-04 NOTE — Progress Notes (Signed)
[facility] [place]  Patient name: Anita [name] MRN: [id number] DOB: 07-28-1946 Sex: female   REQUESTING PROVIDER:    Dr. Sharlet Salina   REASON FOR CONSULT:    claudication  HISTORY OF PRESENT ILLNESS:   Anita [name] is a 72 y.o. female, who is referred today for evaluation of leg pain.  The patient states that she gets leg pain with walking approximately 2 blocks.  She describes this as pain and cramping in the calves and feet.  Resting alleviates the symptoms.  She denies rest pain or nonhealing wounds.  She is a current smoker    PAST MEDICAL HISTORY    Past Medical History:  Diagnosis Date  . Allergy   . Arthritis   . Cataract    bilateral  . Dry eyes   . Hx of adenomatous polyp of colon 03/06/2015     FAMILY HISTORY   Family History  Problem Relation Age of Onset  . Hypertension Mother   . Arthritis Mother   . Colon cancer Neg Hx   . Esophageal cancer Neg Hx   . Rectal cancer Neg Hx   . Stomach cancer Neg Hx     SOCIAL HISTORY:   Social History   Socioeconomic History  . Marital status: Single    Spouse name: Not on file  . Number of children: 2  . Years of education: 12+  . Highest education level: Not on file  Occupational History  . Occupation: Retired  Scientific laboratory technician  . Financial resource strain: Not on file  . Food insecurity:    Worry: Not on file    Inability: Not on file  . Transportation needs:    Medical: Not on file    Non-medical: Not on file  Tobacco Use  . Smoking status: Current Every Day Smoker    Packs/day: 1.00    Types: Cigarettes  . Smokeless tobacco: Never Used  Substance and Sexual Activity  . Alcohol use: Yes    Alcohol/week: 0.0 standard drinks    Comment: socially  . Drug use: No  . Sexual activity: Not on file  Lifestyle  . Physical activity:    Days per week: Not on file    Minutes per session: Not on file  . Stress: Not on file  Relationships    . Social connections:    Talks on phone: Not on file    Gets together: Not on file    Attends religious service: Not on file    Active member of club or organization: Not on file    Attends meetings of clubs or organizations: Not on file    Relationship status: Not on file  . Intimate partner violence:    Fear of current or ex partner: Not on file    Emotionally abused: Not on file    Physically abused: Not on file    Forced sexual activity: Not on file  Other Topics Concern  . Not on file  Social History Narrative   Lives at home with her mother.   Right-handed.   Several sodas per day.    ALLERGIES:    No Known Allergies  CURRENT MEDICATIONS:    Current Outpatient Medications  Medication Sig Dispense Refill  . gabapentin (NEURONTIN) 100 MG capsule Take 100 mg by mouth at bedtime.  0  . meloxicam (MOBIC) 7.5 MG tablet take 1 tablet by mouth twice a day if needed for inflammation and pain  0  . prednisoLONE  acetate (PRED FORTE) 1 % ophthalmic suspension Reported on 06/08/2016  0  . sulfamethoxazole-trimethoprim (BACTRIM DS,SEPTRA DS) 800-160 MG tablet Take 1 tablet by mouth 2 (two) times daily. (Patient not taking: Reported on 09/04/2018) 14 tablet 0  . tiZANidine (ZANAFLEX) 2 MG tablet Take 1 tablet (2 mg total) by mouth 2 (two) times daily as needed for muscle spasms. (Patient not taking: Reported on 09/04/2018) 30 tablet 0   No current facility-administered medications for this visit.     REVIEW OF SYSTEMS:   [X]  denotes positive finding, [ ]  denotes negative finding Cardiac  Comments:  Chest pain or chest pressure:    Shortness of breath upon exertion:    Short of breath when lying flat:    Irregular heart rhythm:        Vascular    Pain in calf, thigh, or hip brought on by ambulation: x   Pain in feet at night that wakes you up from your sleep:  x   Blood clot in your veins: x   Leg swelling:         Pulmonary    Oxygen at home:    Productive cough:      Wheezing:         Neurologic    Sudden weakness in arms or legs:     Sudden numbness in arms or legs:     Sudden onset of difficulty speaking or slurred speech:    Temporary loss of vision in one eye:     Problems with dizziness:         Gastrointestinal    Blood in stool:      Vomited blood:         Genitourinary    Burning when urinating:     Blood in urine:        Psychiatric    Major depression:         Hematologic    Bleeding problems:    Problems with blood clotting too easily:        Skin    Rashes or ulcers:        Constitutional    Fever or chills:     PHYSICAL EXAM:   Vitals:   09/04/18 0916 09/04/18 0917  BP: (!) 150/74 (!) 148/79  Pulse: 82   Resp: 18   SpO2: 100%   Weight: 114 lb (51.7 kg)   Height: 5' 2.5" (1.588 m)     GENERAL: The patient is a well-nourished female, in no acute distress. The vital signs are documented above. CARDIAC: There is a regular rate and rhythm.  VASCULAR: no carotid bruits.  Non palpable pedal or popliteal pulses PULMONARY: Nonlabored respirations ABDOMEN: Soft and non-tender with normal pitched bowel sounds. No pulsatile mass MUSCULOSKELETAL: There are no major deformities or cyanosis. NEUROLOGIC: No focal weakness or paresthesias are detected. SKIN: There are no ulcers or rashes noted. PSYCHIATRIC: The patient has a normal affect.  STUDIES:   I have reviewed his vascular lab studies with the following findings: Right ABI = 0.47, toe pressure = 43 Left ABI = 0.61, toe pressure = 67  Right: Atherosclerosis in the common femoral, femoral, popliteal and tibial arteries. 50-74% stenosis in the common femoral artery. 50-74% stenosis in the distal SFA. Two vessel run off. Peroneal artery appears to be occluded.  Left: Atherosclerosis in the common femoral, femoral, popliteal and tibial arteries. 75-99% stenosis in the superficial femoral artery. Two vessel run off. Posterior tibial artery appears to be  occluded.   ASSESSMENT and PLAN   CLAUDICATION:  We discussed the importance of medical management including taking a statin for hypercholesterolemia, Medication  For hypertension, smoking cessation, an exercise program, and Pletal.  I am referring her to the [facility] program at the [facility] to assist with the exercise component.  She is willing to try and stop smoking.  I will have Dr. Sharlet Salina prescribe Chantix or nicotene replacement.     She will follow up in 3 months.     Annamarie Major, MD [facility] [phone] Pager [phone]

## 2018-11-14 ENCOUNTER — Other Ambulatory Visit: Payer: Self-pay | Admitting: Surgery

## 2018-11-14 MED ORDER — CILOSTAZOL 100 MG PO TABS: 100.0000 mg | ORAL_TABLET | Freq: Two times a day (BID) | ORAL | 11 refills | Status: DC

## 2018-11-20 ENCOUNTER — Ambulatory Visit: Payer: Medicare HMO | Admitting: Surgery

## 2018-12-04 ENCOUNTER — Ambulatory Visit: Payer: Medicare HMO | Admitting: Surgery

## 2018-12-25 ENCOUNTER — Other Ambulatory Visit: Payer: Self-pay

## 2018-12-25 ENCOUNTER — Encounter: Payer: Self-pay | Admitting: Surgery

## 2018-12-25 ENCOUNTER — Ambulatory Visit: Payer: Medicare HMO | Admitting: Surgery

## 2018-12-25 VITALS — BP 139/72 | HR 86 | Temp 97.1°F | Resp 20 | Ht 62.5 in | Wt 114.0 lb

## 2018-12-25 DIAGNOSIS — I70213 Atherosclerosis of native arteries of extremities with intermittent claudication, bilateral legs: Secondary | ICD-10-CM | POA: Diagnosis not present

## 2018-12-25 NOTE — Progress Notes (Signed)
[facility] [place]  Patient name: Anita [name] MRN: [id number] DOB: [birth year] Sex: female   REASON FOR VISIT:    Follow up  HISOTRY OF PRESENT ILLNESS:    Anita [name] is a 73 y.o. female who returns today for follow-up of her claudication.  When I saw her 3 months ago she was having cramping pain in both calves that occurred approximately 2 blocks.  She did not have any open wounds.  We discussed medical management including a statin for hypercholesterolemia smoking cessation blood pressure control and exercise program and initiation of Pletal.  She is back for follow-up.  She is pushing herself with her walking.  She is trying to quit smoking but has not completely stopped.  She did get heartburn with the Pletal and cut back to 1 tablet a day but she is still taking heartburn medication.  She did not have any open wounds.   PAST MEDICAL HISTORY:   Past Medical History:  Diagnosis Date  . Allergy   . Arthritis   . Cataract    bilateral  . Dry eyes   . Hx of adenomatous polyp of colon 03/06/2015     FAMILY HISTORY:   Family History  Problem Relation Age of Onset  . Hypertension Mother   . Arthritis Mother   . Colon cancer Neg Hx   . Esophageal cancer Neg Hx   . Rectal cancer Neg Hx   . Stomach cancer Neg Hx     SOCIAL HISTORY:   Social History   Tobacco Use  . Smoking status: Current Every Day Smoker    Packs/day: 1.00    Types: Cigarettes  . Smokeless tobacco: Never Used  Substance Use Topics  . Alcohol use: Yes    Alcohol/week: 0.0 standard drinks    Comment: socially     ALLERGIES:   No Known Allergies   CURRENT MEDICATIONS:   Current Outpatient Medications  Medication Sig Dispense Refill  . cilostazol (PLETAL) 100 MG tablet Take 1 tablet (100 mg total) by mouth 2 (two) times daily before a meal. 60 tablet 11   No current facility-administered medications for this visit.      REVIEW OF SYSTEMS:   [X]  denotes positive finding, [ ]  denotes negative finding Cardiac  Comments:  Chest pain or chest pressure:    Shortness of breath upon exertion:    Short of breath when lying flat:    Irregular heart rhythm:        Vascular    Pain in calf, thigh, or hip brought on by ambulation:    Pain in feet at night that wakes you up from your sleep:     Blood clot in your veins:    Leg swelling:         Pulmonary    Oxygen at home:    Productive cough:     Wheezing:         Neurologic    Sudden weakness in arms or legs:     Sudden numbness in arms or legs:     Sudden onset of difficulty speaking or slurred speech:    Temporary loss of vision in one eye:     Problems with dizziness:         Gastrointestinal    Blood in stool:     Vomited blood:         Genitourinary    Burning when urinating:     Blood in urine:  Psychiatric    Major depression:         Hematologic    Bleeding problems:    Problems with blood clotting too easily:        Skin    Rashes or ulcers:        Constitutional    Fever or chills:      PHYSICAL EXAM:   Vitals:   12/25/18 1130  BP: 139/72  Pulse: 86  Resp: 20  Temp: (!) 97.1 F (36.2 C)  SpO2: 97%  Weight: 114 lb (51.7 kg)  Height: 5' 2.5" (1.588 m)    GENERAL: The patient is a well-nourished female, in no acute distress. The vital signs are documented above. CARDIAC: There is a regular rate and rhythm.  VASCULAR: Nonpalpable pedal pulses PULMONARY: Non-labored respirations MUSCULOSKELETAL: There are no major deformities or cyanosis. NEUROLOGIC: No focal weakness or paresthesias are detected. SKIN: There are no ulcers or rashes noted. PSYCHIATRIC: The patient has a normal affect.  STUDIES:   None  MEDICAL ISSUES:   Claudication: I discussed with the patient that she should completely discontinue the Pletal.  I encouraged her to continue with therapy walking program.  I told her that I would not  intervene until she quit smoking.  I would like to do everything possible not to intervene as she seems to be tolerating her level of claudication.  She is scheduled to follow-up in 1 year with repeat ABIs.  I did start her on a baby aspirin today.    Annamarie Major, MD [facility] [phone] Pager [phone]

## 2019-06-27 ENCOUNTER — Ambulatory Visit: Payer: Medicare HMO | Admitting: Physician Assistant

## 2019-07-03 ENCOUNTER — Ambulatory Visit (INDEPENDENT_AMBULATORY_CARE_PROVIDER_SITE_OTHER): Payer: Medicare HMO

## 2019-07-03 ENCOUNTER — Other Ambulatory Visit: Payer: Self-pay

## 2019-07-03 ENCOUNTER — Encounter: Payer: Self-pay | Admitting: Orthopaedic Surgery

## 2019-07-03 ENCOUNTER — Ambulatory Visit (INDEPENDENT_AMBULATORY_CARE_PROVIDER_SITE_OTHER): Payer: Medicare HMO | Admitting: Orthopaedic Surgery

## 2019-07-03 DIAGNOSIS — M546 Pain in thoracic spine: Secondary | ICD-10-CM

## 2019-07-03 DIAGNOSIS — G8929 Other chronic pain: Secondary | ICD-10-CM

## 2019-07-03 DIAGNOSIS — M4804 Spinal stenosis, thoracic region: Secondary | ICD-10-CM

## 2019-07-03 MED ORDER — TIZANIDINE HCL 4 MG PO TABS: 4.0000 mg | ORAL_TABLET | Freq: Three times a day (TID) | ORAL | 0 refills | Status: DC | PRN

## 2019-07-03 NOTE — Progress Notes (Signed)
Office Visit Note   Patient: Anita [name]           Date of Birth: 1946/02/29           MRN: [id number] Visit Date: 07/03/2019              Requested by: [name], MD [place],  [place] [id number] PCP: [name], MD   Assessment & Plan: Visit Diagnoses:  1. Chronic midline thoracic back pain     Plan: Given her continued frequent mid back pain over at least 3 years and the essentially normal radiographs recommend MRI of the L-spine rule out HNP as a source of her low back pain.  Have her follow-up after the MRI is over results discuss further treatment.  She was given a muscle relaxant today to take as needed for spasm.  Questions were encouraged and answered at length by Dr. Ninfa Linden myself.  Follow-Up Instructions: Return for After MRI.   Orders:  Orders Placed This Encounter  Procedures   XR Thoracic Spine 2 View   Meds ordered this encounter  Medications   tiZANidine (ZANAFLEX) 4 MG tablet    Sig: Take 1 tablet (4 mg total) by mouth every 8 (eight) hours as needed for muscle spasms.    Dispense:  40 tablet    Refill:  0      Procedures: No procedures performed   Clinical Data: No additional findings.   Subjective: Chief Complaint  Patient presents with   Lower Back - Pain    HPI Mrs. Selk comes in today due to right sided shoulder pain and mid back pain is been going on for a week and a half.  States that the pain began while cutting up some peppers amounts.  She states she is having some leg cramps.  States the pain in her mid back is 10 out of 10 pain worsens burning.  She denies any radicular symptoms down either arm.  She has had some waking pain due to the back pain.  She also notes that she is having some pain in the left shoulder region.  She points to the scapular area of both shoulders.  She was last seen in 2017 by Dr. Ninfa Linden. At that time was having some neck and back pain.  She underwent physical  therapy and improved with this.  She states she has had occasional flares but nothing like she is having now.  She is not doing her home exercise program  at this time.  She has had no new injury.  She has tried Tylenol ice and heat with no relief.  Review of Systems  Constitutional: Negative for chills and fever.  Respiratory: Negative for cough.   Musculoskeletal: Positive for back pain.     Objective: Vital Signs: There were no vitals taken for this visit.  Physical Exam Constitutional:      Appearance: She is normal weight. She is not ill-appearing or diaphoretic.  Cardiovascular:     Pulses: Normal pulses.  Pulmonary:     Effort: Pulmonary effort is normal.  Skin:    Findings: No rash.  Neurological:     Mental Status: She is alert and oriented to person, place, and time.  Psychiatric:        Mood and Affect: Mood normal.        Behavior: Behavior normal.     Ortho Exam Upper extremities 5 out of 5 strength throughout against resistance.  Full  range of motion bilateral shoulders without pain.  Nontender scapular borders bilaterally.  She has tenderness over the mid thoracic spine.  She has good flexion limited extension of the thoracic spine.  Negative Spurling's bilaterally.  Good range of motion of the cervical spine without pain.  She is nontender over the cervical spinal column and paraspinous region of the C-spine.  Sensation intact bilateral hands full motor bilateral hands.  Deep tendon reflexes are 2+ at the biceps triceps and brachial radialis bilaterally.  Specialty Comments:  No specialty comments available.  Imaging: Xr Thoracic Spine 2 View  Result Date: 07/03/2019 Thoracic spine 2 views: Disc space overall well-maintained.  Mild endplate spurring lower thoracic spine at multiple levels.  No spondylolisthesis.  No acute fractures or findings.    PMFS History: Patient Active Problem List   Diagnosis Date Noted   Lipoma 06/29/2018   Claudication ([place])  06/29/2018   Neck pain 06/29/2018   Tobacco abuse 09/03/2016   Shoulder pain 12/23/2015   Hx of adenomatous polyp of colon 03/06/2015   Back pain 12/06/2014   Routine general medical examination at a health care facility 12/06/2014   Past Medical History:  Diagnosis Date   Allergy    Arthritis    Cataract    bilateral   Dry eyes    Hx of adenomatous polyp of colon 03/06/2015    Family History  Problem Relation Age of Onset   Hypertension Mother    Arthritis Mother    Colon cancer Neg Hx    Esophageal cancer Neg Hx    Rectal cancer Neg Hx    Stomach cancer Neg Hx     Past Surgical History:  Procedure Laterality Date   FOOT SURGERY     HEMORRHOID SURGERY     TUBAL LIGATION     Social History   Occupational History   Occupation: Retired  Tobacco Use   Smoking status: Current Every Day Smoker    Packs/day: 1.00    Types: Cigarettes   Smokeless tobacco: Never Used  Substance and Sexual Activity   Alcohol use: Yes    Alcohol/week: 0.0 standard drinks    Comment: socially   Drug use: No   Sexual activity: Not on file

## 2019-07-17 ENCOUNTER — Ambulatory Visit: Payer: Medicare HMO | Admitting: Orthopaedic Surgery

## 2019-08-02 ENCOUNTER — Ambulatory Visit
Admission: RE | Admit: 2019-08-02 | Discharge: 2019-08-02 | Disposition: A | Discharge status: Home / Self Care | Payer: Medicare HMO | Source: Ambulatory Visit | Attending: Orthopaedic Surgery | Admitting: Orthopaedic Surgery

## 2019-08-02 ENCOUNTER — Other Ambulatory Visit: Payer: Self-pay

## 2019-08-02 DIAGNOSIS — M4804 Spinal stenosis, thoracic region: Secondary | ICD-10-CM

## 2019-08-02 DIAGNOSIS — M5114 Intervertebral disc disorders with radiculopathy, thoracic region: Secondary | ICD-10-CM | POA: Diagnosis not present

## 2019-08-04 ENCOUNTER — Other Ambulatory Visit: Payer: Medicare HMO

## 2019-08-07 ENCOUNTER — Encounter: Payer: Self-pay | Admitting: Orthopaedic Surgery

## 2019-08-07 ENCOUNTER — Other Ambulatory Visit: Payer: Self-pay

## 2019-08-07 ENCOUNTER — Ambulatory Visit (INDEPENDENT_AMBULATORY_CARE_PROVIDER_SITE_OTHER): Payer: Medicare HMO | Admitting: Orthopaedic Surgery

## 2019-08-07 DIAGNOSIS — G8929 Other chronic pain: Secondary | ICD-10-CM | POA: Diagnosis not present

## 2019-08-07 DIAGNOSIS — M542 Cervicalgia: Secondary | ICD-10-CM

## 2019-08-07 DIAGNOSIS — M546 Pain in thoracic spine: Secondary | ICD-10-CM

## 2019-08-07 MED ORDER — CYCLOBENZAPRINE HCL 10 MG PO TABS: 10.0000 mg | ORAL_TABLET | Freq: Three times a day (TID) | ORAL | 0 refills | Status: DC | PRN

## 2019-08-07 NOTE — Progress Notes (Signed)
The patient is following up after having an MRI of her thoracic spine.  She has been dealing with chronic neck and thoracic spine pain for the last 3 to 4 years.  In 2017 we sent her for physical therapy on her neck and thoracic spine and this did not help her.  At her last visit she is complaining of midthoracic pain and was sent her for an MRI of the thoracic spine.  Today she is complaining of more right-sided neck pain that radiates into the shoulder and into the parascapular area.  This all to the right side.  She denies any weakness in her upper or lower extremities and normal exam she is not weak in her upper or lower extremities.  The thoracic spine MRI shows just some slight degenerative changes at the lower cervical and upper thoracic spine as well as at T12/L1.  They do mention that there is dorsal displacement of the cord which is subtle which may be indicative of an arachnoid web.  She states that the Zanaflex that we prescribed did not help so we will try Flexeril.  She is not interested in any type of referral for neurosurgery as of yet.  I do feel that given her right-sided neck pain that it may be worth trying a one-time intervention by Dr. Ernestina Patches the right side at the C7 level to see how this affects her symptoms.  She agrees with trying this.  I will then see her back in follow-up after that injection.  All question concerns were answered and addressed.

## 2019-08-20 ENCOUNTER — Other Ambulatory Visit: Payer: Self-pay | Admitting: Internal Medicine

## 2019-08-20 DIAGNOSIS — Z1231 Encounter for screening mammogram for malignant neoplasm of breast: Secondary | ICD-10-CM

## 2019-08-23 ENCOUNTER — Ambulatory Visit: Payer: Self-pay

## 2019-08-23 ENCOUNTER — Other Ambulatory Visit: Payer: Self-pay

## 2019-08-23 ENCOUNTER — Ambulatory Visit (INDEPENDENT_AMBULATORY_CARE_PROVIDER_SITE_OTHER): Payer: Medicare HMO | Admitting: Physical Medicine and Rehabilitation

## 2019-08-23 ENCOUNTER — Ambulatory Visit
Admission: RE | Admit: 2019-08-23 | Discharge: 2019-08-23 | Disposition: A | Discharge status: Home / Self Care | Payer: Medicare HMO | Source: Ambulatory Visit | Attending: Internal Medicine | Admitting: Internal Medicine

## 2019-08-23 ENCOUNTER — Encounter: Payer: Self-pay | Admitting: Physical Medicine and Rehabilitation

## 2019-08-23 VITALS — BP 155/78 | HR 95

## 2019-08-23 DIAGNOSIS — M47812 Spondylosis without myelopathy or radiculopathy, cervical region: Secondary | ICD-10-CM | POA: Diagnosis not present

## 2019-08-23 DIAGNOSIS — Z1231 Encounter for screening mammogram for malignant neoplasm of breast: Secondary | ICD-10-CM

## 2019-08-23 DIAGNOSIS — M4313 Spondylolisthesis, cervicothoracic region: Secondary | ICD-10-CM

## 2019-08-23 MED ORDER — METHYLPREDNISOLONE ACETATE 80 MG/ML IJ SUSP: 80.0000 mg | Freq: Once | INTRAMUSCULAR | Status: DC

## 2019-08-23 NOTE — Progress Notes (Signed)
 .  Numeric Pain Rating Scale and Functional Assessment Average Pain 8   In the last MONTH (on 0-10 scale) has pain interfered with the following?  1. General activity like being  able to carry out your everyday physical activities such as walking, climbing stairs, carrying groceries, or moving a chair?  Rating(8)   +Driver, -BT, -Dye Allergies.  

## 2019-08-24 NOTE — Procedures (Signed)
Cervical Facet Joint Intra-Articular Injection with Fluoroscopic Guidance  Patient: Anita [name]      Date of Birth: 08/15/1946 MRN: [id number] PCP: [name], MD      Visit Date: 08/23/2019   Universal Protocol:    Date/Time: 08/24/2011:29 PM  Consent Given By: the patient  Position: PRONE  Additional Comments: Vital signs were monitored before and after the procedure. Patient was prepped and draped in the usual sterile fashion. The correct patient, procedure, and site was verified.   Injection Procedure Details:  Procedure Site One Meds Administered:  Meds ordered this encounter  Medications  . methylPREDNISolone acetate (DEPO-MEDROL) injection 80 mg     Laterality: Bilateral  Location/Site:  C7-T1  Needle size: 25 G  Needle type: Spinal  Needle Placement: Articular  Findings:  -Contrast Used: 0.5 mL iohexol 180 mg iodine/mL   -Comments: Excellent flow of contrast producing a partial arthrogram.  Procedure Details: The region overlying the facet joints mentioned above were localized under fluoroscopic visualization. The needle was inserted down to the level of the lateral mass of the superior articular process of the facet joint to be injected. Then, the needle was "walked off" inferiorly into the lateral aspect of the facet joint. Bi-planar images were used for confirming placement and spot radiographs were documented.  A 0.25 ml volume of Omnipaque-240 was injected into the facet joint and a standard partial arthrogram was obtained. Radiographs were obtained of the arthrogram. A 0.5 ml. volume of the steroid/anesthetic solution was injected into the joint. This procedure was repeated for each facet joint injected.   Additional Comments:  No complications occurred Dressing: Band-Aid    Post-procedure details: Patient was observed during the procedure. Post-procedure instructions were reviewed.  Patient left the clinic in stable condition.

## 2019-08-24 NOTE — Progress Notes (Signed)
Anita [name] - 73 y.o. female MRN [id number]  Date of birth: 1946-01-28  Office Visit Note: Visit Date: 08/23/2019 PCP: [name], MD Referred by: [name], *  Subjective: Chief Complaint  Patient presents with  . Neck - Pain  . Head - Pain  . Right Shoulder - Pain  . Left Shoulder - Pain   HPI:  Anita [name] is a 73 y.o. female who comes in today For planned C7-T1 facet joint block as requested by Dr. Jean Rosenthal.  When the patient saw Dr. Ninfa Linden she was having more right-sided neck pain but is now having bilateral neck and shoulder pain.  She does have MRI evidence of facet arthropathy at C7-T1 with small listhesis.  We will go ahead and complete diagnostic bilateral facet blocks.  She is failed conservative care otherwise and his notes can be reviewed for further details and justification.  ROS Otherwise per HPI.  Assessment & Plan: Visit Diagnoses:  1. Cervical spondylosis without myelopathy   2. Spondylolisthesis of cervicothoracic region     Plan: No additional findings.   Meds & Orders:  Meds ordered this encounter  Medications  . methylPREDNISolone acetate (DEPO-MEDROL) injection 80 mg    Orders Placed This Encounter  Procedures  . Facet Injection  . XR C-ARM NO REPORT    Follow-up: Return for Jean Rosenthal, MD.   Procedures: No procedures performed  Cervical Facet Joint Intra-Articular Injection with Fluoroscopic Guidance  Patient: Anita [name]      Date of Birth: June 05, 1946 MRN: [id number] PCP: [name], MD      Visit Date: 08/23/2019   Universal Protocol:    Date/Time: 08/24/2011:29 PM  Consent Given By: the patient  Position: PRONE  Additional Comments: Vital signs were monitored before and after the procedure. Patient was prepped and draped in the usual sterile fashion. The correct patient, procedure, and site was verified.   Injection Procedure Details:  Procedure Site One  Meds Administered:  Meds ordered this encounter  Medications  . methylPREDNISolone acetate (DEPO-MEDROL) injection 80 mg     Laterality: Bilateral  Location/Site:  C7-T1  Needle size: 25 G  Needle type: Spinal  Needle Placement: Articular  Findings:  -Contrast Used: 0.5 mL iohexol 180 mg iodine/mL   -Comments: Excellent flow of contrast producing a partial arthrogram.  Procedure Details: The region overlying the facet joints mentioned above were localized under fluoroscopic visualization. The needle was inserted down to the level of the lateral mass of the superior articular process of the facet joint to be injected. Then, the needle was "walked off" inferiorly into the lateral aspect of the facet joint. Bi-planar images were used for confirming placement and spot radiographs were documented.  A 0.25 ml volume of Omnipaque-240 was injected into the facet joint and a standard partial arthrogram was obtained. Radiographs were obtained of the arthrogram. A 0.5 ml. volume of the steroid/anesthetic solution was injected into the joint. This procedure was repeated for each facet joint injected.   Additional Comments:  No complications occurred Dressing: Band-Aid    Post-procedure details: Patient was observed during the procedure. Post-procedure instructions were reviewed.  Patient left the clinic in stable condition.   Clinical History: MRI THORACIC SPINE WITHOUT CONTRAST  TECHNIQUE: Multiplanar, multisequence MR imaging of the thoracic spine was performed. No intravenous contrast was administered.  COMPARISON:  None.  FINDINGS: Alignment: Exaggerated thoracic kyphosis. Slight retrolisthesis at T12-L1. C7-T1 mild anterolisthesis likely from facet degeneration.  Vertebrae: Heterogeneous  marrow without focal lesion, discitis, or fracture.  Cord: The cord at T7 is subtly but convincingly anteriorly positioned relative to the remainder of the cord with slight  deformity of the dorsal cord. This is seen on both sagittal and axial images. No alteration of flow voids to suggest a cyst or T2 hyperintense mass.  Paraspinal and other soft tissues: No evidence of mass or inflammation.  Disc levels:  Mild spondylosis. Mild disc bulging at T11-12. Negative facets. No impingement.  IMPRESSION: 1. Mild dorsal cord deformity and displacement at T7, suspect underlying arachnoid web. 2. Mild degenerative changes without impingement.   Electronically Signed   By: Monte Fantasia M.D.   On: 08/03/2019 07:40     Objective:  VS:  HT:    WT:   BMI:     BP:(!) 155/78  HR:95bpm  TEMP: ( )  RESP:  Physical Exam  Ortho Exam Imaging: No results found.

## 2019-09-04 ENCOUNTER — Encounter: Payer: Self-pay | Admitting: Orthopaedic Surgery

## 2019-09-04 ENCOUNTER — Ambulatory Visit (INDEPENDENT_AMBULATORY_CARE_PROVIDER_SITE_OTHER): Payer: Medicare HMO | Admitting: Orthopaedic Surgery

## 2019-09-04 ENCOUNTER — Telehealth: Payer: Self-pay | Admitting: Orthopaedic Surgery

## 2019-09-04 DIAGNOSIS — M546 Pain in thoracic spine: Secondary | ICD-10-CM

## 2019-09-04 DIAGNOSIS — G8929 Other chronic pain: Secondary | ICD-10-CM | POA: Diagnosis not present

## 2019-09-04 DIAGNOSIS — M542 Cervicalgia: Secondary | ICD-10-CM | POA: Diagnosis not present

## 2019-09-04 MED ORDER — HYDROCODONE-ACETAMINOPHEN 5-325 MG PO TABS: 1.0000 | ORAL_TABLET | Freq: Two times a day (BID) | ORAL | 0 refills | Status: DC | PRN

## 2019-09-04 NOTE — Telephone Encounter (Signed)
Can you send this into her [employer]? I cancelled it through her [facility]

## 2019-09-04 NOTE — Telephone Encounter (Signed)
Pt called in said her prescription for hydrocodone was sent to the wrong pharmacy please send that to [place].   [phone]

## 2019-09-04 NOTE — Progress Notes (Signed)
After having an intervention her cervical spine by Dr. Ernestina Patches close to 2 weeks ago.  This was a C7-T1 facet block.  That is helped alleviate some of her pain.  She does have chronic thoracic pain as well.  She is 73 years old.  She does have a massage chair at home.  On exam she does have some pain in the mid thoracic spine in the lower cervical spine but overall she looks better.  Her posture is improved.  She has good strength in her bilateral upper and lower extremities today.  At this point follow-up will be as needed.  I did talk to her about physical therapy but she is declined to 1 to go to this.  She does have a massage chair at home which she will continue.  She can try anti-inflammatories and intermittent ice and heat.  She talked about the possibility of pain management referral.  She is not taking narcotics I did give her a one-time prescription for some hydrocodone to use very sparingly for when she does have a flareup of pain.  She can always have repeat injections down the road if needed.  All question concerns were answered addressed.  Follow-up is otherwise as needed.

## 2019-10-26 ENCOUNTER — Telehealth: Payer: Self-pay | Admitting: Internal Medicine

## 2019-10-26 NOTE — Telephone Encounter (Signed)
Patient requesting referral to Dr. Albertina Parr lupton [address] [place], [place] [address] due to painful bumps on back, please advise when referral is placed

## 2019-10-26 NOTE — Telephone Encounter (Signed)
Would need visit here to assess bumps and make sure she is getting appropriate referral.

## 2019-10-26 NOTE — Telephone Encounter (Signed)
Left message for patient to call back to schedule.  °

## 2019-10-29 ENCOUNTER — Encounter: Payer: Self-pay | Admitting: Internal Medicine

## 2019-10-29 ENCOUNTER — Ambulatory Visit (INDEPENDENT_AMBULATORY_CARE_PROVIDER_SITE_OTHER): Payer: Medicare HMO | Admitting: Internal Medicine

## 2019-10-29 ENCOUNTER — Other Ambulatory Visit: Payer: Self-pay

## 2019-10-29 VITALS — BP 140/90 | HR 81 | Temp 98.5°F | Ht 62.5 in | Wt 127.0 lb

## 2019-10-29 DIAGNOSIS — Z0000 Encounter for general adult medical examination without abnormal findings: Secondary | ICD-10-CM

## 2019-10-29 DIAGNOSIS — Z23 Encounter for immunization: Secondary | ICD-10-CM

## 2019-10-29 DIAGNOSIS — L02212 Cutaneous abscess of back [any part, except buttock]: Secondary | ICD-10-CM | POA: Diagnosis not present

## 2019-10-29 DIAGNOSIS — R6889 Other general symptoms and signs: Secondary | ICD-10-CM | POA: Diagnosis not present

## 2019-10-29 MED ORDER — SULFAMETHOXAZOLE-TRIMETHOPRIM 800-160 MG PO TABS: 1.0000 | ORAL_TABLET | Freq: Two times a day (BID) | ORAL | 0 refills | Status: DC

## 2019-10-29 NOTE — Progress Notes (Signed)
   Subjective:   Patient ID: Anita [name], female    DOB: [birth year], 73 y.o.   MRN: [id number]  HPI The patient is a 73 YO female coming in for concerns about a painful swelling on her back. Noticed it about a week ago. Overall is growing in size since that time. Is painful to touch. Used heating pad on it once. Denies that it is draining anything out. Denies fevers or chills. Cannot see it. Pain 5-6/10. Denies taking anything oral for the pain. Denies having prior like this before.   Review of Systems  Constitutional: Negative.   HENT: Negative.   Eyes: Negative.   Respiratory: Negative for cough, chest tightness and shortness of breath.   Cardiovascular: Negative for chest pain, palpitations and leg swelling.  Gastrointestinal: Negative for abdominal distention, abdominal pain, constipation, diarrhea, nausea and vomiting.  Musculoskeletal: Positive for myalgias.  Skin: Positive for wound.  Neurological: Negative.   Psychiatric/Behavioral: Negative.     Objective:  Physical Exam Constitutional:      Appearance: She is well-developed.  HENT:     Head: Normocephalic and atraumatic.  Neck:     Musculoskeletal: Normal range of motion.  Cardiovascular:     Rate and Rhythm: Normal rate and regular rhythm.  Pulmonary:     Effort: Pulmonary effort is normal. No respiratory distress.     Breath sounds: Normal breath sounds. No wheezing or rales.  Abdominal:     General: Bowel sounds are normal. There is no distension.     Palpations: Abdomen is soft.     Tenderness: There is no abdominal tenderness. There is no rebound.  Musculoskeletal:     Comments: Lesion left mid back with opening, able to express purulent material until this is not tender and all material is removed from the lesion.   Skin:    General: Skin is warm and dry.  Neurological:     Mental Status: She is alert and oriented to person, place, and time.     Coordination: Coordination normal.     Vitals:   10/29/19 1500  BP: 140/90  Pulse: 81  Temp: 98.5 F (36.9 C)  TempSrc: Oral  SpO2: 99%  Weight: 127 lb (57.6 kg)  Height: 5' 2.5" (1.588 m)    Assessment & Plan:  Flu shot given at visit

## 2019-10-29 NOTE — Patient Instructions (Signed)
We have drained the area. We have sent in an antibiotic bactrim to take 1 pill twice a day for 5 days.

## 2019-10-30 DIAGNOSIS — L729 Follicular cyst of the skin and subcutaneous tissue, unspecified: Secondary | ICD-10-CM | POA: Insufficient documentation

## 2019-10-30 DIAGNOSIS — L02212 Cutaneous abscess of back [any part, except buttock]: Secondary | ICD-10-CM | POA: Insufficient documentation

## 2019-10-30 NOTE — Assessment & Plan Note (Signed)
Drained during visit, rx bactrim 5 days to help prevent recurrence. Advised to use heat and keep clean until healed. Return precautions given.

## 2019-11-01 ENCOUNTER — Encounter: Payer: Self-pay | Admitting: Internal Medicine

## 2019-11-26 ENCOUNTER — Telehealth: Payer: Self-pay | Admitting: Internal Medicine

## 2019-11-26 NOTE — Telephone Encounter (Signed)
Patient is calling to schedule AWV on Wednesday 11/28/2019 after her appt with Dr. Sharlet Salina. Please advise Cb- [phone]

## 2019-11-28 ENCOUNTER — Ambulatory Visit (INDEPENDENT_AMBULATORY_CARE_PROVIDER_SITE_OTHER)
Admission: RE | Admit: 2019-11-28 | Discharge: 2019-11-28 | Disposition: A | Discharge status: Home / Self Care | Payer: Medicare HMO | Source: Ambulatory Visit | Attending: Internal Medicine | Admitting: Internal Medicine

## 2019-11-28 ENCOUNTER — Ambulatory Visit (INDEPENDENT_AMBULATORY_CARE_PROVIDER_SITE_OTHER): Payer: Medicare HMO | Admitting: Internal Medicine

## 2019-11-28 ENCOUNTER — Other Ambulatory Visit (INDEPENDENT_AMBULATORY_CARE_PROVIDER_SITE_OTHER): Payer: Medicare HMO

## 2019-11-28 ENCOUNTER — Encounter: Payer: Self-pay | Admitting: Internal Medicine

## 2019-11-28 ENCOUNTER — Other Ambulatory Visit: Payer: Self-pay

## 2019-11-28 VITALS — BP 140/100 | HR 91 | Temp 98.3°F | Ht 62.5 in | Wt 128.0 lb

## 2019-11-28 DIAGNOSIS — E2839 Other primary ovarian failure: Secondary | ICD-10-CM

## 2019-11-28 DIAGNOSIS — R6889 Other general symptoms and signs: Secondary | ICD-10-CM | POA: Diagnosis not present

## 2019-11-28 DIAGNOSIS — I739 Peripheral vascular disease, unspecified: Secondary | ICD-10-CM | POA: Diagnosis not present

## 2019-11-28 DIAGNOSIS — Z0000 Encounter for general adult medical examination without abnormal findings: Secondary | ICD-10-CM | POA: Diagnosis not present

## 2019-11-28 DIAGNOSIS — N644 Mastodynia: Secondary | ICD-10-CM | POA: Insufficient documentation

## 2019-11-28 DIAGNOSIS — I10 Essential (primary) hypertension: Secondary | ICD-10-CM | POA: Insufficient documentation

## 2019-11-28 DIAGNOSIS — Z720 Tobacco use: Secondary | ICD-10-CM

## 2019-11-28 LAB — CBC
HCT: 31.2 % — ABNORMAL LOW (ref 36.0–46.0)
Hemoglobin: 10.7 g/dL — ABNORMAL LOW (ref 12.0–15.0)
MCHC: 34.1 g/dL (ref 30.0–36.0)
MCV: 79.3 fl (ref 78.0–100.0)
Platelets: 208 10*3/uL (ref 150.0–400.0)
RBC: 3.94 Mil/uL (ref 3.87–5.11)
RDW: 23.1 % — ABNORMAL HIGH (ref 11.5–15.5)
WBC: 6.7 10*3/uL (ref 4.0–10.5)

## 2019-11-28 LAB — COMPREHENSIVE METABOLIC PANEL
ALT: 31 U/L (ref 0–35)
AST: 34 U/L (ref 0–37)
Albumin: 4 g/dL (ref 3.5–5.2)
Alkaline Phosphatase: 78 U/L (ref 39–117)
BUN: 31 mg/dL — ABNORMAL HIGH (ref 6–23)
CO2: 24 mEq/L (ref 19–32)
Calcium: 10.4 mg/dL (ref 8.4–10.5)
Chloride: 105 mEq/L (ref 96–112)
Creatinine, Ser: 1.17 mg/dL (ref 0.40–1.20)
GFR: 55.13 mL/min — ABNORMAL LOW (ref >60.00)
Glucose, Bld: 78 mg/dL (ref 70–99)
Potassium: 4.9 mEq/L (ref 3.5–5.1)
Sodium: 136 mEq/L (ref 135–145)
Total Bilirubin: 0.3 mg/dL (ref 0.2–1.2)
Total Protein: 8.7 g/dL — ABNORMAL HIGH (ref 6.0–8.3)

## 2019-11-28 LAB — LIPID PANEL
Cholesterol: 198 mg/dL (ref 0–200)
HDL: 72.7 mg/dL (ref >39.00)
LDL Cholesterol: 102 mg/dL — ABNORMAL HIGH (ref 0–99)
NonHDL: 125.41
Total CHOL/HDL Ratio: 3
Triglycerides: 119 mg/dL (ref 0.0–149.0)
VLDL: 23.8 mg/dL (ref 0.0–40.0)

## 2019-11-28 LAB — HEMOGLOBIN A1C: Hgb A1c MFr Bld: 4.9 % (ref 4.6–6.5)

## 2019-11-28 MED ORDER — AMLODIPINE BESYLATE 10 MG PO TABS: 10.0000 mg | ORAL_TABLET | Freq: Every day | ORAL | 3 refills | Status: DC

## 2019-11-28 NOTE — Assessment & Plan Note (Signed)
Ordered US left and right breast given high density. It is uncertain if the left breast pain may be related to cardiac instead of breast and evaluation for that is ongoing as well with cardiology referral.

## 2019-11-28 NOTE — Assessment & Plan Note (Signed)
Not taking her pletal at this time. Given EKG changes needs cardiology referral with new chest pain as she is high risk for vascular disease.

## 2019-11-28 NOTE — Assessment & Plan Note (Signed)
Has poor follow up in the past. With elevated levels on multiple occasions. EKG done today with some changes. Needs referral to cardiology. Consideration of this left chest/breast pain to be cardiac. She is high risk. Rx amlodipine 10 mg daily for blood pressure.

## 2019-11-28 NOTE — Progress Notes (Signed)
Subjective:   Patient ID: Anita [name], female    DOB: 03-04-1946, 73 y.o.   MRN: [id number]  HPI Here for medicare wellness and physical, with new complaints. Please see A/P for status and treatment of chronic medical problems.   HPI #3: Here for new breast pain (left and sometimes right, going on for years, worse in the last several weeks, had mammogram Sept 2020 which was normal but with dense breast tissue, denies nipple discharge or mass, denies change in pain with activity, worse in the evening but not when lying down, has not tried anything for it, no skin color change on the breasts, maybe even lower than the breasts, not taking aspirin daily, is a smoker of many years, not physically active).   Diet: heart healthy Physical activity: sedentary Depression/mood screen: negative Hearing: intact to whispered voice, moderate loss Visual acuity: grossly normal with lens, overdue annual eye exam  ADLs: capable Fall risk: none Home safety: good Cognitive evaluation: intact to orientation, naming, recall and repetition EOL planning: adv directives discussed    Office Visit from 10/29/2019 in [place]  PHQ-2 Total Score  0       I have personally reviewed and have noted 1. The patient's medical and social history - reviewed today no changes 2. Their use of alcohol, tobacco or illicit drugs 3. Their current medications and supplements 4. The patient's functional ability including ADL's, fall risks, home safety risks and hearing or visual impairment. 5. Diet and physical activities 6. Evidence for depression or mood disorders 7. Care team reviewed and updated  Patient Care Team: [name], MD as PCP - General (Internal Medicine) Past Medical History:  Diagnosis Date  . Allergy   . Arthritis   . Cataract    bilateral  . Dry eyes   . Hx of adenomatous polyp of colon 03/06/2015   Past Surgical History:  Procedure Laterality Date  .  FOOT SURGERY    . HEMORRHOID SURGERY    . TUBAL LIGATION     Family History  Problem Relation Age of Onset  . Hypertension Mother   . Arthritis Mother   . Colon cancer Neg Hx   . Esophageal cancer Neg Hx   . Rectal cancer Neg Hx   . Stomach cancer Neg Hx     Review of Systems  Constitutional: Negative.   HENT: Negative.   Eyes: Negative.   Respiratory: Negative for cough, chest tightness and shortness of breath.   Cardiovascular: Positive for chest pain. Negative for palpitations and leg swelling.       Breast pain  Gastrointestinal: Negative for abdominal distention, abdominal pain, constipation, diarrhea, nausea and vomiting.  Musculoskeletal: Positive for arthralgias.  Skin: Negative.   Neurological: Negative.   Psychiatric/Behavioral: Negative.     Objective:  Physical Exam Constitutional:      Appearance: She is well-developed.  HENT:     Head: Normocephalic and atraumatic.  Neck:     Musculoskeletal: Normal range of motion.  Cardiovascular:     Rate and Rhythm: Normal rate and regular rhythm.     Comments: No breast mass felt, breasts are not tender to palpation or chest muscles Pulmonary:     Effort: Pulmonary effort is normal. No respiratory distress.     Breath sounds: Normal breath sounds. No wheezing or rales.  Abdominal:     General: Bowel sounds are normal. There is no distension.     Palpations: Abdomen is soft.  Tenderness: There is no abdominal tenderness. There is no rebound.  Musculoskeletal:        General: Tenderness present.  Skin:    General: Skin is warm and dry.  Neurological:     Mental Status: She is alert and oriented to person, place, and time.     Coordination: Coordination normal.     Vitals:   11/28/19 0850 11/28/19 0926  BP: (!) 150/100 (!) 140/100  Pulse: 91   Temp: 98.3 F (36.8 C)   TempSrc: Oral   SpO2: 99%   Weight: 128 lb (58.1 kg)   Height: 5' 2.5" (1.588 m)    EKG: Rate 86, axis normal, interval normal, some  ST or t wave changes from prior non-specific, LVH, overall changed from 2012  This visit occurred during the SARS-CoV-2 public health emergency.  Safety protocols were in place, including screening questions prior to the visit, additional usage of staff PPE, and extensive cleaning of exam room while observing appropriate contact time as indicated for disinfecting solutions.   Assessment & Plan:

## 2019-11-28 NOTE — Assessment & Plan Note (Signed)
Flu shot up to date. Pneumonia complete. Shingrix counseled. Tetanus due 2027. Colonoscopy due 2021. Mammogram due 2022, pap smear aged out and dexa ordered today. Counseled about sun safety and mole surveillance. Counseled about the dangers of distracted driving. Given 10 year screening recommendations.

## 2019-11-28 NOTE — Patient Instructions (Signed)
We will check the labs today. We have done the EKG which looks a little different than last time. We are starting you on amlodipine which is a 1 pill a day blood pressure medicine.   The other thing we will do is to get you in with a heart doctor to see if you need a stress test.    Health Maintenance, Female Adopting a healthy lifestyle and getting preventive care are important in promoting health and wellness. Ask your health care provider about:  The right schedule for you to have regular tests and exams.  Things you can do on your own to prevent diseases and keep yourself healthy. What should I know about diet, weight, and exercise? Eat a healthy diet   Eat a diet that includes plenty of vegetables, fruits, low-fat dairy products, and lean protein.  Do not eat a lot of foods that are high in solid fats, added sugars, or sodium. Maintain a healthy weight Body mass index (BMI) is used to identify weight problems. It estimates body fat based on height and weight. Your health care provider can help determine your BMI and help you achieve or maintain a healthy weight. Get regular exercise Get regular exercise. This is one of the most important things you can do for your health. Most adults should:  Exercise for at least 150 minutes each week. The exercise should increase your heart rate and make you sweat (moderate-intensity exercise).  Do strengthening exercises at least twice a week. This is in addition to the moderate-intensity exercise.  Spend less time sitting. Even light physical activity can be beneficial. Watch cholesterol and blood lipids Have your blood tested for lipids and cholesterol at 71 years of age, then have this test every 5 years. Have your cholesterol levels checked more often if:  Your lipid or cholesterol levels are high.  You are older than 71 years of age.  You are at high risk for heart disease. What should I know about cancer screening? Depending on  your health history and family history, you may need to have cancer screening at various ages. This may include screening for:  Breast cancer.  Cervical cancer.  Colorectal cancer.  Skin cancer.  Lung cancer. What should I know about heart disease, diabetes, and high blood pressure? Blood pressure and heart disease  High blood pressure causes heart disease and increases the risk of stroke. This is more likely to develop in people who have high blood pressure readings, are of African descent, or are overweight.  Have your blood pressure checked: ? Every 3-5 years if you are 53-34 years of age. ? Every year if you are 53 years old or older. Diabetes Have regular diabetes screenings. This checks your fasting blood sugar level. Have the screening done:  Once every three years after age 69 if you are at a normal weight and have a low risk for diabetes.  More often and at a younger age if you are overweight or have a high risk for diabetes. What should I know about preventing infection? Hepatitis B If you have a higher risk for hepatitis B, you should be screened for this virus. Talk with your health care provider to find out if you are at risk for hepatitis B infection. Hepatitis C Testing is recommended for:  Everyone born from [birth year] through 1963.  Anyone with known risk factors for hepatitis C. Sexually transmitted infections (STIs)  Get screened for STIs, including gonorrhea and chlamydia, if: ? You are  sexually active and are younger than 71 years of age. ? You are older than 71 years of age and your health care provider tells you that you are at risk for this type of infection. ? Your sexual activity has changed since you were last screened, and you are at increased risk for chlamydia or gonorrhea. Ask your health care provider if you are at risk.  Ask your health care provider about whether you are at high risk for HIV. Your health care provider may recommend a prescription  medicine to help prevent HIV infection. If you choose to take medicine to prevent HIV, you should first get tested for HIV. You should then be tested every 3 months for as long as you are taking the medicine. Pregnancy  If you are about to stop having your period (premenopausal) and you may become pregnant, seek counseling before you get pregnant.  Take 400 to 800 micrograms (mcg) of folic acid every day if you become pregnant.  Ask for birth control (contraception) if you want to prevent pregnancy. Osteoporosis and menopause Osteoporosis is a disease in which the bones lose minerals and strength with aging. This can result in bone fractures. If you are 86 years old or older, or if you are at risk for osteoporosis and fractures, ask your health care provider if you should:  Be screened for bone loss.  Take a calcium or vitamin D supplement to lower your risk of fractures.  Be given hormone replacement therapy (HRT) to treat symptoms of menopause. Follow these instructions at home: Lifestyle  Do not use any products that contain nicotine or tobacco, such as cigarettes, e-cigarettes, and chewing tobacco. If you need help quitting, ask your health care provider.  Do not use street drugs.  Do not share needles.  Ask your health care provider for help if you need support or information about quitting drugs. Alcohol use  Do not drink alcohol if: ? Your health care provider tells you not to drink. ? You are pregnant, may be pregnant, or are planning to become pregnant.  If you drink alcohol: ? Limit how much you use to 0-1 drink a day. ? Limit intake if you are breastfeeding.  Be aware of how much alcohol is in your drink. In the [place] one drink equals one 12 oz bottle of beer (355 mL), one 5 oz glass of wine (148 mL), or one 1 oz glass of hard liquor (44 mL). General instructions  Schedule regular health, dental, and eye exams.  Stay current with your vaccines.  Tell your health  care provider if: ? You often feel depressed. ? You have ever been abused or do not feel safe at home. Summary  Adopting a healthy lifestyle and getting preventive care are important in promoting health and wellness.  Follow your health care provider's instructions about healthy diet, exercising, and getting tested or screened for diseases.  Follow your health care provider's instructions on monitoring your cholesterol and blood pressure. This information is not intended to replace advice given to you by your health care provider. Make sure you discuss any questions you have with your health care provider. Document Released: 06/21/2011 Document Revised: 11/29/2018 Document Reviewed: 11/29/2018 Elsevier Patient Education  2020 [employer].

## 2019-11-28 NOTE — Assessment & Plan Note (Signed)
Advised to quit and she is cutting back but still smoking.

## 2019-11-29 ENCOUNTER — Other Ambulatory Visit: Payer: Self-pay | Admitting: Internal Medicine

## 2019-11-29 DIAGNOSIS — N644 Mastodynia: Secondary | ICD-10-CM

## 2019-12-03 ENCOUNTER — Encounter: Payer: Self-pay | Admitting: Internal Medicine

## 2019-12-04 ENCOUNTER — Telehealth: Payer: Self-pay | Admitting: Internal Medicine

## 2019-12-04 ENCOUNTER — Other Ambulatory Visit: Payer: Self-pay

## 2019-12-04 ENCOUNTER — Ambulatory Visit (INDEPENDENT_AMBULATORY_CARE_PROVIDER_SITE_OTHER): Payer: Medicare HMO | Admitting: Obstetrics and Gynecology

## 2019-12-04 ENCOUNTER — Other Ambulatory Visit (HOSPITAL_COMMUNITY)
Admission: RE | Admit: 2019-12-04 | Discharge: 2019-12-04 | Disposition: A | Discharge status: Home / Self Care | Payer: Medicare HMO | Source: Ambulatory Visit | Attending: Obstetrics and Gynecology | Admitting: Obstetrics and Gynecology

## 2019-12-04 ENCOUNTER — Encounter: Payer: Self-pay | Admitting: Obstetrics and Gynecology

## 2019-12-04 VITALS — BP 148/81 | HR 91 | Wt 126.8 lb

## 2019-12-04 DIAGNOSIS — B9689 Other specified bacterial agents as the cause of diseases classified elsewhere: Secondary | ICD-10-CM | POA: Diagnosis not present

## 2019-12-04 DIAGNOSIS — Z01419 Encounter for gynecological examination (general) (routine) without abnormal findings: Secondary | ICD-10-CM

## 2019-12-04 DIAGNOSIS — R6889 Other general symptoms and signs: Secondary | ICD-10-CM | POA: Diagnosis not present

## 2019-12-04 DIAGNOSIS — N950 Postmenopausal bleeding: Secondary | ICD-10-CM | POA: Diagnosis present

## 2019-12-04 DIAGNOSIS — R87810 Cervical high risk human papillomavirus (HPV) DNA test positive: Secondary | ICD-10-CM | POA: Insufficient documentation

## 2019-12-04 DIAGNOSIS — Z1151 Encounter for screening for human papillomavirus (HPV): Secondary | ICD-10-CM | POA: Diagnosis not present

## 2019-12-04 DIAGNOSIS — C539 Malignant neoplasm of cervix uteri, unspecified: Secondary | ICD-10-CM | POA: Diagnosis not present

## 2019-12-04 DIAGNOSIS — N760 Acute vaginitis: Secondary | ICD-10-CM | POA: Diagnosis not present

## 2019-12-04 DIAGNOSIS — N930 Postcoital and contact bleeding: Secondary | ICD-10-CM | POA: Diagnosis not present

## 2019-12-04 NOTE — Progress Notes (Signed)
Sometimes bleeding and pelvic pain.

## 2019-12-04 NOTE — Progress Notes (Addendum)
Obstetrics and Gynecology Annual Patient Evaluation  Appointment Date: 12/04/2019  OBGYN Clinic: [facility]  Primary Care Provider: [name]  Referring Provider: [name], *  Chief Complaint: annual gyn visit  History of Present Illness: Anita [name] is a 73 y.o. African-American G2P2 (No LMP recorded. Patient is postmenopausal., LMP: 6s), seen for the above chief complaint.   Only issue is she noted brown spotting for a week earlier this month with some discomfort. She saw it with wiping and on underwear   No breast s/s, fevers, chills, nausea, vomiting, abdominal pain, dysuria, hematuria, vaginal itching,  diarrhea, constipation, blood in BMs  Review of Systems:  as noted in the History of Present Illness.  Patient Active Problem List   Diagnosis Date Noted  . Hypertension 11/28/2019  . Breast pain 11/28/2019  . Abscess of back 10/30/2019  . Cervicalgia 08/07/2019  . Lipoma 06/29/2018  . Claudication ([place]) 06/29/2018  . Neck pain 06/29/2018  . Tobacco abuse 09/03/2016  . Shoulder pain 12/23/2015  . Hx of adenomatous polyp of colon 03/06/2015  . Back pain 12/06/2014  . Routine general medical examination at a health care facility 12/06/2014    Past Medical History:  Past Medical History:  Diagnosis Date  . Allergy   . Arthritis   . Cataract    bilateral  . Cervical dysplasia    "scraping" in her 11s  . Dry eyes   . Hx of adenomatous polyp of colon 03/06/2015    Past Surgical History:  Past Surgical History:  Procedure Laterality Date  . FOOT SURGERY    . HEMORRHOID SURGERY    . TUBAL LIGATION      Past Obstetrical History:  OB History  Gravida Para Term Preterm AB Living  2         2  SAB TAB Ectopic Multiple Live Births          2    # Outcome Date GA Lbr Len/2nd Weight Sex Delivery Anes PTL Lv  2 Gravida           1 Gravida             Obstetric Comments  Vag delivery x 2    Past  Gynecological History: As per HPI. History of Pap Smear(s): h/o "scraping" in her 6s Last pap unknown HRT History: no  Social History:  Social History   Socioeconomic History  . Marital status: Single    Spouse name: Not on file  . Number of children: 2  . Years of education: 12+  . Highest education level: Not on file  Occupational History  . Occupation: Retired  Tobacco Use  . Smoking status: Current Every Day Smoker    Packs/day: 1.00    Types: Cigarettes  . Smokeless tobacco: Never Used  Substance and Sexual Activity  . Alcohol use: Yes    Alcohol/week: 0.0 standard drinks    Comment: socially  . Drug use: No  . Sexual activity: Not on file  Other Topics Concern  . Not on file  Social History Narrative   Lives at home with her mother.   Right-handed.   Several sodas per day.   Social Determinants of Health   Financial Resource Strain:   . Difficulty of Paying Living Expenses: Not on file  Food Insecurity:   . Worried About Charity fundraiser in the Last Year: Not on file  . Ran Out of Food in the Last Year: Not  on file  Transportation Needs:   . Lack of Transportation (Medical): Not on file  . Lack of Transportation (Non-Medical): Not on file  Physical Activity:   . Days of Exercise per Week: Not on file  . Minutes of Exercise per Session: Not on file  Stress:   . Feeling of Stress : Not on file  Social Connections:   . Frequency of Communication with Friends and Family: Not on file  . Frequency of Social Gatherings with Friends and Family: Not on file  . Attends Religious Services: Not on file  . Active Member of Clubs or Organizations: Not on file  . Attends Archivist Meetings: Not on file  . Marital Status: Not on file  Intimate Partner Violence:   . Fear of Current or Ex-Partner: Not on file  . Emotionally Abused: Not on file  . Physically Abused: Not on file  . Sexually Abused: Not on file    Family History:  Family History   Problem Relation Age of Onset  . Hypertension Mother   . Arthritis Mother   . Colon cancer Neg Hx   . Esophageal cancer Neg Hx   . Rectal cancer Neg Hx   . Stomach cancer Neg Hx     Medications Silvina J. Rittner had no medications administered during this visit. Current Outpatient Medications  Medication Sig Dispense Refill  . amLODipine (NORVASC) 10 MG tablet Take 1 tablet (10 mg total) by mouth daily. 90 tablet 3  . cilostazol (PLETAL) 100 MG tablet Take 1 tablet (100 mg total) by mouth 2 (two) times daily before a meal. (Patient not taking: Reported on 10/29/2019) 60 tablet 11   No current facility-administered medications for this visit.    Allergies Patient has no known allergies.   Physical Exam:  BP (!) 148/81   Pulse 91   Wt 126 lb 12.8 oz (57.5 kg)   BMI 22.82 kg/m  Body mass index is 22.82 kg/m. General appearance: Well nourished, well developed female in no acute distress.  Neck:  Supple, normal appearance, and no thyromegaly  Cardiovascular: normal s1 and s2.  No murmurs, rubs or gallops. Respiratory:  Clear to auscultation bilateral. Normal respiratory effort Abdomen: positive bowel sounds and no masses, hernias; diffusely non tender to palpation, non distended Breasts: pt declines Neuro/Psych:  Normal mood and affect.  Skin:  Warm and dry.  Lymphatic:  No inguinal lymphadenopathy.   Pelvic exam: is not limited by body habitus EGBUS: severe atrophy, within normal limits, Vagina: severe atrophy, within normal limits and with no blood or discharge in the vault, Cervix: normal appearing cervix without tenderness, discharge or lesions. Uterus:  nonenlarged and non tender and Adnexa:  normal adnexa and no mass, fullness, tenderness Rectovaginal: deferred  Patient declines endometrial biopsy (see below)  Laboratory: none  Radiology: none  Assessment: pt stable  Plan:  1. Postmenopausal bleeding D/w her I recommend doing a pelvic u/s and getting path with  an endometrial biopsy. Pt would like to do u/s only, which I told her is fine if her endometrial stripe is normal and she has no more s/s, pap is negative.  - Cytology - PAP - Cervicovaginal ancillary only - US PELVIC COMPLETE WITH TRANSVAGINAL; Future  RTC PRN  Durene Romans MD Attending [facility])

## 2019-12-04 NOTE — Telephone Encounter (Signed)
If Dr. Crawford wanted patient on a statin she would put patient on a statin 

## 2019-12-04 NOTE — Telephone Encounter (Signed)
Anita [name], from [facility], called and stating a fax was sent over regarding pts stating use due to pts diabetes. Anita [name] is requesting a response. Please advise.    Callback # [phone] Fax # [phone]

## 2019-12-05 LAB — CERVICOVAGINAL ANCILLARY ONLY
Bacterial Vaginitis (gardnerella): POSITIVE — AB
Candida Glabrata: NEGATIVE
Candida Vaginitis: NEGATIVE
Comment: NEGATIVE
Comment: NEGATIVE
Comment: NEGATIVE

## 2019-12-06 ENCOUNTER — Encounter: Payer: Self-pay | Admitting: Obstetrics and Gynecology

## 2019-12-06 ENCOUNTER — Other Ambulatory Visit: Payer: Self-pay | Admitting: Lactation Services

## 2019-12-06 ENCOUNTER — Other Ambulatory Visit: Payer: Self-pay | Admitting: Obstetrics and Gynecology

## 2019-12-06 DIAGNOSIS — C539 Malignant neoplasm of cervix uteri, unspecified: Secondary | ICD-10-CM | POA: Insufficient documentation

## 2019-12-06 DIAGNOSIS — N760 Acute vaginitis: Secondary | ICD-10-CM

## 2019-12-06 DIAGNOSIS — A599 Trichomoniasis, unspecified: Secondary | ICD-10-CM | POA: Insufficient documentation

## 2019-12-06 DIAGNOSIS — N950 Postmenopausal bleeding: Secondary | ICD-10-CM | POA: Insufficient documentation

## 2019-12-06 DIAGNOSIS — B9689 Other specified bacterial agents as the cause of diseases classified elsewhere: Secondary | ICD-10-CM

## 2019-12-06 LAB — CYTOLOGY - PAP
Chlamydia: NEGATIVE
Comment: NEGATIVE
Comment: NEGATIVE
Comment: NEGATIVE
Comment: NEGATIVE
Comment: NEGATIVE
Comment: NORMAL
HPV 16: POSITIVE — AB
HPV 18 / 45: NEGATIVE
High risk HPV: POSITIVE — AB
Neisseria Gonorrhea: NEGATIVE
Trichomonas: POSITIVE — AB

## 2019-12-06 MED ORDER — METRONIDAZOLE 500 MG PO TABS: 2000.0000 mg | ORAL_TABLET | Freq: Once | ORAL | 0 refills | Status: AC

## 2019-12-06 MED ORDER — METRONIDAZOLE 500 MG PO TABS: 500.0000 mg | ORAL_TABLET | Freq: Two times a day (BID) | ORAL | 0 refills | Status: DC

## 2019-12-07 ENCOUNTER — Telehealth: Payer: Self-pay | Admitting: Obstetrics and Gynecology

## 2019-12-07 ENCOUNTER — Telehealth: Payer: Self-pay | Admitting: *Deleted

## 2019-12-07 ENCOUNTER — Telehealth: Payer: Self-pay

## 2019-12-07 DIAGNOSIS — C539 Malignant neoplasm of cervix uteri, unspecified: Secondary | ICD-10-CM

## 2019-12-07 NOTE — Telephone Encounter (Signed)
Cancel the gyn u/s appt in January per Dr. Ilda Basset.

## 2019-12-07 NOTE — Telephone Encounter (Addendum)
-----   Message from Aletha Halim, MD sent at 12/07/2019  8:27 AM EST ----- Regarding: needs urgent gyn onc referral for new cervical cancer diagnosis  [place] gyn/onc at 1125 and spoke with Barbaraann Share, RN. Referral has been reviewed. Pt will come back into [facility] for endometrial biopsy on 12/10/19 with Ilda Basset, MD. Results of biopsy will be reviewed by gyn/onc before scheduling pt for appt with gyn/onc.  [facility] sent a message requesting a call back. Called at 1146. Sharyn Lull states that after further review, they have decided to schedule pt for 12/12/19 @ 1015. Pt will be contacted by gyn/onc. Requested that Dr. Ilda Basset send biopsy for stat results. Staff member states that if results are not back they may have to delay patient's appt. Dr. Ilda Basset notified.

## 2019-12-07 NOTE — Telephone Encounter (Signed)
GYN Telephone Note Discussed with Ms. Billing re: pap smear and likely cervical origin. Will set her up to see gyn onc, and I told her I recommend she come in for an endometrial biopsy to 100% determine if uterine or cervical in origin, which patient is now amenable to. Will overbook for Monday   Durene Romans MD Attending Center for [facility] (Faculty Practice) 12/07/2019 Time: [phone]

## 2019-12-07 NOTE — Telephone Encounter (Signed)
Called and spoke with Apolonio Schneiders at Dr Ilda Basset office; explained that we will schedule the patient for an appt on 12/23 at 10:15am. We will call the patient. Explained that our doctors are asking Dr Ilda Basset to send biopsy for a rush read. Explained that we may have to move her appt if the results are not available.   Attempted to call the patient to schedule an appt; left message for the patient to call the office back

## 2019-12-07 NOTE — Telephone Encounter (Signed)
Patient called back and scheduled appt for 12/30

## 2019-12-10 ENCOUNTER — Ambulatory Visit (INDEPENDENT_AMBULATORY_CARE_PROVIDER_SITE_OTHER): Payer: Medicare HMO | Admitting: Obstetrics and Gynecology

## 2019-12-10 ENCOUNTER — Ambulatory Visit: Payer: Medicare HMO

## 2019-12-10 ENCOUNTER — Ambulatory Visit
Admission: RE | Admit: 2019-12-10 | Discharge: 2019-12-10 | Disposition: A | Discharge status: Home / Self Care | Payer: Medicare HMO | Source: Ambulatory Visit | Attending: Internal Medicine | Admitting: Internal Medicine

## 2019-12-10 ENCOUNTER — Other Ambulatory Visit (HOSPITAL_COMMUNITY)
Admission: RE | Admit: 2019-12-10 | Discharge: 2019-12-10 | Disposition: A | Discharge status: Home / Self Care | Payer: Medicare HMO | Source: Ambulatory Visit | Attending: Obstetrics and Gynecology | Admitting: Obstetrics and Gynecology

## 2019-12-10 ENCOUNTER — Encounter: Payer: Self-pay | Admitting: Obstetrics and Gynecology

## 2019-12-10 ENCOUNTER — Other Ambulatory Visit: Payer: Self-pay

## 2019-12-10 VITALS — BP 138/63 | HR 85 | Wt 127.6 lb

## 2019-12-10 DIAGNOSIS — N950 Postmenopausal bleeding: Secondary | ICD-10-CM | POA: Insufficient documentation

## 2019-12-10 DIAGNOSIS — Z8742 Personal history of other diseases of the female genital tract: Secondary | ICD-10-CM | POA: Diagnosis not present

## 2019-12-10 DIAGNOSIS — R87619 Unspecified abnormal cytological findings in specimens from cervix uteri: Secondary | ICD-10-CM

## 2019-12-10 DIAGNOSIS — R921 Mammographic calcification found on diagnostic imaging of breast: Secondary | ICD-10-CM | POA: Diagnosis not present

## 2019-12-10 DIAGNOSIS — N905 Atrophy of vulva: Secondary | ICD-10-CM

## 2019-12-10 DIAGNOSIS — C4492 Squamous cell carcinoma of skin, unspecified: Secondary | ICD-10-CM | POA: Diagnosis not present

## 2019-12-10 DIAGNOSIS — N644 Mastodynia: Secondary | ICD-10-CM

## 2019-12-10 DIAGNOSIS — N952 Postmenopausal atrophic vaginitis: Secondary | ICD-10-CM

## 2019-12-10 DIAGNOSIS — D069 Carcinoma in situ of cervix, unspecified: Secondary | ICD-10-CM | POA: Diagnosis not present

## 2019-12-10 DIAGNOSIS — R6889 Other general symptoms and signs: Secondary | ICD-10-CM | POA: Diagnosis not present

## 2019-12-10 DIAGNOSIS — R922 Inconclusive mammogram: Secondary | ICD-10-CM | POA: Diagnosis not present

## 2019-12-10 DIAGNOSIS — A599 Trichomoniasis, unspecified: Secondary | ICD-10-CM

## 2019-12-10 NOTE — Procedures (Signed)
Endometrial Biopsy Procedure Note  Pre-operative Diagnosis: SCC on pap smear. H/o PMB. Vulvar-vaginal atrophy  Post-operative Diagnosis: same  Procedure Details  The patient confirmed she is taking the flagyl bid. The risks (including infection, bleeding, pain, and uterine perforation) and benefits of the procedure were explained to the patient and Written informed consent was obtained.  The patient was placed in the dorsal lithotomy position.  Bimanual exam showed the uterus to be in the neutral position.  A Pederson speculum inserted in the vagina, and the cervix was visualized and appeared normal. The cervix was then prepped with povidone iodine, and a sharp tenaculum was applied to the anterior lip of the cervix for stabilization.  A pipelle was inserted into the uterine cavity and sounded the uterus to a depth of 6cm.  A scant amount of tissue was collected after 2 passes; patient was very intolerant of the exam.  The sample was sent for pathologic examination stat  Condition: Stable  Complications: None  Plan: The patient was advised to call for any fever or for prolonged or severe pain or bleeding. She was advised to use OTC analgesics as needed for mild to moderate pain. She was advised to avoid vaginal intercourse for 48 hours or until the bleeding has completely stopped.  Durene Romans MD Attending Center for [facility] [employer])

## 2019-12-12 ENCOUNTER — Telehealth: Payer: Self-pay | Admitting: Obstetrics and Gynecology

## 2019-12-12 LAB — SURGICAL PATHOLOGY

## 2019-12-12 NOTE — Telephone Encounter (Signed)
GYN Telephone Note Patient called and VM left re: mychart message sent  MyChart message: Hi, Anita [name]. Your uterine biopsy results are similar to your pap smear. I wonder if I got deep enough inside to get into the uterine cavity to get a sample or if I'm picking up samples from the cervix. You'll talk more about this at your visit on 12/30.  If you have any questions before then, please let me know

## 2019-12-17 ENCOUNTER — Encounter: Payer: Self-pay | Admitting: *Deleted

## 2019-12-18 ENCOUNTER — Encounter: Payer: Self-pay | Admitting: Cardiovascular Disease

## 2019-12-18 ENCOUNTER — Telehealth: Payer: Self-pay | Admitting: Obstetrics and Gynecology

## 2019-12-18 ENCOUNTER — Other Ambulatory Visit: Payer: Self-pay

## 2019-12-18 ENCOUNTER — Ambulatory Visit (INDEPENDENT_AMBULATORY_CARE_PROVIDER_SITE_OTHER): Payer: Medicare HMO | Admitting: Cardiovascular Disease

## 2019-12-18 VITALS — BP 137/76 | HR 88 | Temp 93.2°F | Ht 62.5 in | Wt 128.6 lb

## 2019-12-18 DIAGNOSIS — Z79899 Other long term (current) drug therapy: Secondary | ICD-10-CM | POA: Diagnosis not present

## 2019-12-18 DIAGNOSIS — I10 Essential (primary) hypertension: Secondary | ICD-10-CM

## 2019-12-18 DIAGNOSIS — R6889 Other general symptoms and signs: Secondary | ICD-10-CM | POA: Diagnosis not present

## 2019-12-18 DIAGNOSIS — I739 Peripheral vascular disease, unspecified: Secondary | ICD-10-CM | POA: Diagnosis not present

## 2019-12-18 DIAGNOSIS — Z720 Tobacco use: Secondary | ICD-10-CM

## 2019-12-18 DIAGNOSIS — R0989 Other specified symptoms and signs involving the circulatory and respiratory systems: Secondary | ICD-10-CM

## 2019-12-18 DIAGNOSIS — R011 Cardiac murmur, unspecified: Secondary | ICD-10-CM

## 2019-12-18 MED ORDER — ATORVASTATIN CALCIUM 20 MG PO TABS: 20.0000 mg | ORAL_TABLET | Freq: Every day | ORAL | 3 refills | Status: DC

## 2019-12-18 NOTE — Assessment & Plan Note (Signed)
History of essential hypertension blood pressure measured today 137/76.  She is on amlodipine.

## 2019-12-18 NOTE — Assessment & Plan Note (Signed)
Greater than 50-pack-year tobacco abuse currently smoking 1/2 pack a day down from a pack a day 3 months ago recalcitrant risk factor modification.

## 2019-12-18 NOTE — Assessment & Plan Note (Signed)
History of fairly typical calf claudication with abnormal Doppler studies performed 07/11/2018 followed by Dr. Trula Slade.

## 2019-12-18 NOTE — Telephone Encounter (Signed)
GYN Telephone Note Another voicemail left for patient to call with any questions regarding her latest pathology  Durene Romans MD Attending Center for [facility] (Faculty Practice) 12/17/2019 Time: [phone]

## 2019-12-18 NOTE — Patient Instructions (Signed)
Medication Instructions:  Start taking 20mg  Atorvastatin Daily.  If you need a refill on your cardiac medications before your next appointment, please call your pharmacy.   Lab work: Fasting Lipids and Hepatic Function in 3 months. No appointment necessary.   Testing/Procedures: Your physician has requested that you have an echocardiogram. Echocardiography is a painless test that uses sound waves to create images of your heart. It provides your doctor with information about the size and shape of your heart and how well your heart's chambers and valves are working. This procedure takes approximately one hour. There are no restrictions for this procedure. [place] has requested that you have a carotid duplex. This test is an ultrasound of the carotid arteries in your neck. It looks at blood flow through these arteries that supply the brain with blood. Allow one hour for this exam. There are no restrictions or special instructions.   Follow-Up: At [facility], you and your health needs are our priority.  As part of our continuing mission to provide you with exceptional heart care, we have created designated Provider Care Teams.  These Care Teams include your primary Cardiologist (physician) and Advanced Practice Providers (APPs -  Physician Assistants and Nurse Practitioners) who all work together to provide you with the care you need, when you need it. You may see Dr Gwenlyn Found or one of the following Advanced Practice Providers on your designated Care Team:    Kerin Ransom, PA-C  [place], [place]  Coletta Memos,  Your physician wants you to follow-up in: 6 months

## 2019-12-18 NOTE — H&P (View-Only) (Signed)
GYNECOLOGIC ONCOLOGY NEW PATIENT CONSULTATION   Patient Name: Anita [name]  Patient Age: 73 y.o. Date of Service: 12/19/19 Referring Provider: [name], MD [place],  [place] [id number]   Primary Care Provider: [name], MD Consulting Provider: Jeral Pinch, MD   Assessment/Plan:  Postmenopausal female with squamous cell carcinoma on Pap and no visible lesion on exam.  Discussed findings of her pelvic exam.  On colposcopy, I do not appreciate an obvious lesion consistent with cervical cancer.  Her tissue is quite atrophic, and I biopsied 2 areas that had acetowhite changes that I think were more related to atrophy as these were present prior to application of acetic acid.  There is no nodularity or firmness of her cervix.  I will call her with the results of this biopsy once they return.  It is very possible that she will need a cold knife cone or LEEP to further evaluate her cervix if biopsies are either negative for cancer or show a cancer, given no visible lesion.  I have tentatively scheduled this for 7 January.  I encourage patient to quit or decrease tobacco use.  We discussed the role that tobacco use plays in human papilloma virus infection.  A copy of this note was sent to the patient's referring provider.   Jeral Pinch, MD  Division of Gynecologic Oncology  Department of Obstetrics and Gynecology  [facility]  ___________________________________________  Chief Complaint: Chief Complaint  Patient presents with  . Cervical dysplasia    History of Present Illness:  Anita [name] is a 73 y.o. y.o. female who is seen in consultation at the request of [name], * for an evaluation of squamous cell carcinoma on recent Pap.  Patient noted brown spotting in early December and was referred to gyn by her PCP. On exam, atrophy noted but no obvious lesion.  Pap smear revealed squamous cell  carcinoma and follow-up endometrial biopsy showed CIN-3/CIS.  Patient reports doing well since her last visit.  She had a couple of days of very light spotting last week but otherwise denies any vaginal discharge or bleeding.  She denies any pelvic pain or cramping.  This has been the first postmenopausal bleeding that she has had.  She endorses a good appetite without any nausea or emesis.  She has recently developed some constipation that she thinks is secondary to medications.  She is using laxatives which help.  She continues to endorse tobacco use and is smoking approximately half a pack per day.  The patient was recently treated for trichomonas which was found incidentally on her Pap test.  She denies any sexual activity in the last 10 years.  PAST MEDICAL HISTORY:  Past Medical History:  Diagnosis Date  . Allergy   . Arthritis   . Cataract    bilateral  . Cervical dysplasia    "scraping" in her 11s  . Cervical spondylosis   . Chest pain   . Claudication ([place])   . Dry eyes   . GERD (gastroesophageal reflux disease)   . Hx of adenomatous polyp of colon 03/06/2015  . Hypertension   . Tobacco abuse      PAST SURGICAL HISTORY:  Past Surgical History:  Procedure Laterality Date  . FOOT SURGERY    . HEMORRHOID SURGERY    . TUBAL LIGATION      OB/GYN HISTORY:  OB History  Gravida Para Term Preterm AB Living  2  2  SAB TAB Ectopic Multiple Live Births          2    # Outcome Date GA Lbr Len/2nd Weight Sex Delivery Anes PTL Lv  2 Gravida           1 Gravida             Obstetric Comments  Vag delivery x 2    No LMP recorded. Patient is postmenopausal.  Age at menarche: 69 Age at menopause: 39s Hx of HRT: denies Hx of STDs: recently treated for Trich Last pap: see HPI History of abnormal pap smears: yes, cervical procedure in her 25s ("scraping" per patient)  SCREENING STUDIES:  Last mammogram: 11/2019  Last colonoscopy: 02/2015, scheduled for one in March  2021 Last bone mineral density: 11/2019  MEDICATIONS: Outpatient Encounter Medications as of 12/19/2019  Medication Sig  . calcium-vitamin D (OSCAL WITH D) 250-125 MG-UNIT tablet Take 1 tablet by mouth daily.  . ferrous sulfate 324 MG TBEC Take 324 mg by mouth.  Marland Kitchen amLODipine (NORVASC) 10 MG tablet Take 1 tablet (10 mg total) by mouth daily.  Marland Kitchen atorvastatin (LIPITOR) 20 MG tablet Take 1 tablet (20 mg total) by mouth daily.  . [DISCONTINUED] cilostazol (PLETAL) 100 MG tablet Take 1 tablet (100 mg total) by mouth 2 (two) times daily before a meal.   No facility-administered encounter medications on file as of 12/19/2019.    ALLERGIES:  No Known Allergies   FAMILY HISTORY:  Family History  Problem Relation Age of Onset  . Hypertension Mother   . Arthritis Mother   . Colon cancer Neg Hx   . Esophageal cancer Neg Hx   . Rectal cancer Neg Hx   . Stomach cancer Neg Hx   . Ovarian cancer Neg Hx   . Uterine cancer Neg Hx   . Breast cancer Neg Hx      SOCIAL HISTORY:    Social Connections:   . Frequency of Communication with Friends and Family: Not on file  . Frequency of Social Gatherings with Friends and Family: Not on file  . Attends Religious Services: Not on file  . Active Member of Clubs or Organizations: Not on file  . Attends Archivist Meetings: Not on file  . Marital Status: Not on file    REVIEW OF SYSTEMS:  Denies appetite changes, fevers, chills, fatigue, unexplained weight changes. Denies hearing loss, neck lumps or masses, mouth sores, ringing in ears or voice changes. Denies cough or wheezing.  Denies shortness of breath. Denies chest pain or palpitations. Denies leg swelling. Denies abdominal distention, pain, blood in stools, constipation, diarrhea, nausea, vomiting, or early satiety. Denies pain with intercourse, dysuria, frequency, hematuria or incontinence. Denies hot flashes, pelvic pain.   Denies joint pain, back pain or muscle  pain/cramps. Denies itching, rash, or wounds. Denies dizziness, headaches, numbness or seizures. Denies swollen lymph nodes or glands, denies easy bruising or bleeding. Denies anxiety, depression, confusion, or decreased concentration.  Physical Exam:  Vital Signs for this encounter:  Blood pressure (!) 144/74, pulse 87, temperature 98 F (36.7 C), temperature source Temporal, resp. rate 16, height 5' 2.5" (1.588 m), weight 128 lb 4 oz (58.2 kg), SpO2 100 %. Body mass index is 23.08 kg/m. General: Alert, oriented, no acute distress.  HEENT: Normocephalic, atraumatic. Sclera anicteric.  Chest: Clear to auscultation bilaterally.  No wheezes rhonchi or rales. Cardiovascular: Regular rate and rhythm, no murmurs, rubs, or gallops.  Abdomen: Normoactive bowel sounds. Soft, nondistended,  nontender to palpation. No masses or hepatosplenomegaly appreciated. No palpable fluid wave.  Extremities: Grossly normal range of motion. Warm, well perfused. No edema bilaterally.  Skin: No rashes or lesions.  Lymphatics: No cervical, supraclavicular, or inguinal adenopathy.  GU:  Normal external female genitalia. No lesions. No discharge or bleeding.             Bladder/urethra:  No lesions or masses, well supported bladder             Vagina: Moderate atrophy noted, no bleeding or discharge.             Cervix: Atrophic.  No obvious lesions.  Procedure: after discussing the risks of the procedure, the patient gave verbal consent. Colposcopy performed and after application of acetic acid, some mild acetowhite changes noted diffusely on the cervix, possibly consistent with just atrophic areas.  No atypical vasculature, punctations or mosaicism noted.  Random biopsies taken from 1 and 7:00.  Gentle ECC performed with Cytobrush. Hemostasis noted at end of procedure. Patient tolerated it well.  On exam, no nodularity or firmness of the cervix noted.             Uterus:  Small, mobile, no parametrial involvement or  nodularity.             Adnexa: No masses.  Rectal: no parametrial involvement.  LABORATORY AND RADIOLOGIC DATA:  Outside medical records were reviewed to synthesize the above history, along with the history and physical obtained during the visit.   EMB on 12/21: CIN3/CIS, invasion can't be determined  Pap on 12/15: SCC, +HPV16, +Trichomonas

## 2019-12-18 NOTE — Progress Notes (Signed)
12/18/2019 Anita [name]   25-Dec-1945  [id number]  Primary Physician [name], MD Primary Cardiologist: [name] MD [place] Car[place], [place]  HPI:  Anita [name] is a 73 y.o. mildly overweight divorced African-American female mother of 2 children, grandmother of 3 grandchildren referred by Dr. Sharlet Salina, her PCP, for cardiovascular valuation because of risk factors.  She is retired from working in accounts payable.  Risk factors include tobacco abuse having smoked 50 pack years currently smoking 1/2 pack/day recalcitrant to respect modification.  She does have treated hypertension as well as family history with a mother who had a stent.  She is never had a heart attack or stroke.  She denies chest pain or shortness of breath.  She does have bilateral calf claudication with abnormal Doppler studies performed 07/11/2018, followed by Dr. Trula Slade.  Her most recent lipid profile performed 11/28/2019 revealed total cholesterol 198, LDL 102 and HDL 72.   Current Meds  Medication Sig  . amLODipine (NORVASC) 10 MG tablet Take 1 tablet (10 mg total) by mouth daily.  . [DISCONTINUED] cilostazol (PLETAL) 100 MG tablet Take 1 tablet (100 mg total) by mouth 2 (two) times daily before a meal.     No Known Allergies  Social History   Socioeconomic History  . Marital status: Single    Spouse name: Not on file  . Number of children: 2  . Years of education: 12+  . Highest education level: Not on file  Occupational History  . Occupation: Retired  Tobacco Use  . Smoking status: Current Every Day Smoker    Packs/day: 1.00    Types: Cigarettes  . Smokeless tobacco: Never Used  Substance and Sexual Activity  . Alcohol use: Yes    Alcohol/week: 0.0 standard drinks    Comment: socially  . Drug use: No  . Sexual activity: Not on file  Other Topics Concern  . Not on file  Social History Narrative   Lives at home with her mother.   Right-handed.   Several sodas per day.     Social Determinants of Health   Financial Resource Strain:   . Difficulty of Paying Living Expenses: Not on file  Food Insecurity:   . Worried About Charity fundraiser in the Last Year: Not on file  . Ran Out of Food in the Last Year: Not on file  Transportation Needs:   . Lack of Transportation (Medical): Not on file  . Lack of Transportation (Non-Medical): Not on file  Physical Activity:   . Days of Exercise per Week: Not on file  . Minutes of Exercise per Session: Not on file  Stress:   . Feeling of Stress : Not on file  Social Connections:   . Frequency of Communication with Friends and Family: Not on file  . Frequency of Social Gatherings with Friends and Family: Not on file  . Attends Religious Services: Not on file  . Active Member of Clubs or Organizations: Not on file  . Attends Archivist Meetings: Not on file  . Marital Status: Not on file  Intimate Partner Violence:   . Fear of Current or Ex-Partner: Not on file  . Emotionally Abused: Not on file  . Physically Abused: Not on file  . Sexually Abused: Not on file     Review of Systems: General: negative for chills, fever, night sweats or weight changes.  Cardiovascular: negative for chest pain, dyspnea on exertion, edema, orthopnea, palpitations, paroxysmal  nocturnal dyspnea or shortness of breath Dermatological: negative for rash Respiratory: negative for cough or wheezing Urologic: negative for hematuria Abdominal: negative for nausea, vomiting, diarrhea, bright red blood per rectum, melena, or hematemesis Neurologic: negative for visual changes, syncope, or dizziness All other systems reviewed and are otherwise negative except as noted above.    Blood pressure 137/76, pulse 88, temperature (!) 93.2 F (34 C), height 5' 2.5" (1.588 m), weight 128 lb 9.6 oz (58.3 kg), SpO2 99 %.  General appearance: alert and no distress Neck: no adenopathy, no JVD, supple, symmetrical, trachea midline, thyroid  not enlarged, symmetric, no tenderness/mass/nodules and Bilateral carotid bruits Lungs: clear to auscultation bilaterally Heart: Soft outflow tract murmur consistent with aortic stenosis Extremities: extremities normal, atraumatic, no cyanosis or edema Pulses: Diminished pedal pulses bilaterally Skin: Skin color, texture, turgor normal. No rashes or lesions Neurologic: Alert and oriented X 3, normal strength and tone. Normal symmetric reflexes. Normal coordination and gait  EKG not performed today  ASSESSMENT AND PLAN:   Tobacco abuse Greater than 50-pack-year tobacco abuse currently smoking 1/2 pack a day down from a pack a day 3 months ago recalcitrant risk factor modification.  Claudication [facility]) History of fairly typical calf claudication with abnormal Doppler studies performed 07/11/2018 followed by Dr. Trula Slade.  Hypertension History of essential hypertension blood pressure measured today 137/76.  She is on amlodipine.      [name] MD FACP,FACC,FAHA, [facility] 12/18/2019 9:36 AM

## 2019-12-18 NOTE — Progress Notes (Signed)
GYNECOLOGIC ONCOLOGY NEW PATIENT CONSULTATION   Patient Name: Anita [name]  Patient Age: 73 y.o. Date of Service: 12/19/19 Referring Provider: [name], MD [place],  [place] [address]   Primary Care Provider: [name], MD Consulting Provider: Jeral Pinch, MD   Assessment/Plan:  Postmenopausal female with squamous cell carcinoma on Pap and no visible lesion on exam.  Discussed findings of her pelvic exam.  On colposcopy, I do not appreciate an obvious lesion consistent with cervical cancer.  Her tissue is quite atrophic, and I biopsied 2 areas that had acetowhite changes that I think were more related to atrophy as these were present prior to application of acetic acid.  There is no nodularity or firmness of her cervix.  I will call her with the results of this biopsy once they return.  It is very possible that she will need a cold knife cone or LEEP to further evaluate her cervix if biopsies are either negative for cancer or show a cancer, given no visible lesion.  I have tentatively scheduled this for 7 January.  I encourage patient to quit or decrease tobacco use.  We discussed the role that tobacco use plays in human papilloma virus infection.  A copy of this note was sent to the patient's referring provider.   Jeral Pinch, MD  Division of Gynecologic Oncology  Department of Obstetrics and Gynecology  [facility]  ___________________________________________  Chief Complaint: Chief Complaint  Patient presents with  . Cervical dysplasia    History of Present Illness:  Anita [name] is a 73 y.o. y.o. female who is seen in consultation at the request of [name], * for an evaluation of squamous cell carcinoma on recent Pap.  Patient noted brown spotting in early December and was referred to gyn by her PCP. On exam, atrophy noted but no obvious lesion.  Pap smear revealed squamous cell  carcinoma and follow-up endometrial biopsy showed CIN-3/CIS.  Patient reports doing well since her last visit.  She had a couple of days of very light spotting last week but otherwise denies any vaginal discharge or bleeding.  She denies any pelvic pain or cramping.  This has been the first postmenopausal bleeding that she has had.  She endorses a good appetite without any nausea or emesis.  She has recently developed some constipation that she thinks is secondary to medications.  She is using laxatives which help.  She continues to endorse tobacco use and is smoking approximately half a pack per day.  The patient was recently treated for trichomonas which was found incidentally on her Pap test.  She denies any sexual activity in the last 10 years.  PAST MEDICAL HISTORY:  Past Medical History:  Diagnosis Date  . Allergy   . Arthritis   . Cataract    bilateral  . Cervical dysplasia    "scraping" in her 52s  . Cervical spondylosis   . Chest pain   . Claudication ([place])   . Dry eyes   . GERD (gastroesophageal reflux disease)   . Hx of adenomatous polyp of colon 03/06/2015  . Hypertension   . Tobacco abuse      PAST SURGICAL HISTORY:  Past Surgical History:  Procedure Laterality Date  . FOOT SURGERY    . HEMORRHOID SURGERY    . TUBAL LIGATION      OB/GYN HISTORY:  OB History  Gravida Para Term Preterm AB Living  2  2  SAB TAB Ectopic Multiple Live Births          2    # Outcome Date GA Lbr Len/2nd Weight Sex Delivery Anes PTL Lv  2 Gravida           1 Gravida             Obstetric Comments  Vag delivery x 2    No LMP recorded. Patient is postmenopausal.  Age at menarche: 61 Age at menopause: 37s Hx of HRT: denies Hx of STDs: recently treated for Trich Last pap: see HPI History of abnormal pap smears: yes, cervical procedure in her 76s ("scraping" per patient)  SCREENING STUDIES:  Last mammogram: 11/2019  Last colonoscopy: 02/2015, scheduled for one in March  2021 Last bone mineral density: 11/2019  MEDICATIONS: Outpatient Encounter Medications as of 12/19/2019  Medication Sig  . calcium-vitamin D (OSCAL WITH D) 250-125 MG-UNIT tablet Take 1 tablet by mouth daily.  . ferrous sulfate 324 MG TBEC Take 324 mg by mouth.  Marland Kitchen amLODipine (NORVASC) 10 MG tablet Take 1 tablet (10 mg total) by mouth daily.  Marland Kitchen atorvastatin (LIPITOR) 20 MG tablet Take 1 tablet (20 mg total) by mouth daily.  . [DISCONTINUED] cilostazol (PLETAL) 100 MG tablet Take 1 tablet (100 mg total) by mouth 2 (two) times daily before a meal.   No facility-administered encounter medications on file as of 12/19/2019.    ALLERGIES:  No Known Allergies   FAMILY HISTORY:  Family History  Problem Relation Age of Onset  . Hypertension Mother   . Arthritis Mother   . Colon cancer Neg Hx   . Esophageal cancer Neg Hx   . Rectal cancer Neg Hx   . Stomach cancer Neg Hx   . Ovarian cancer Neg Hx   . Uterine cancer Neg Hx   . Breast cancer Neg Hx      SOCIAL HISTORY:    Social Connections:   . Frequency of Communication with Friends and Family: Not on file  . Frequency of Social Gatherings with Friends and Family: Not on file  . Attends Religious Services: Not on file  . Active Member of Clubs or Organizations: Not on file  . Attends Archivist Meetings: Not on file  . Marital Status: Not on file    REVIEW OF SYSTEMS:  Denies appetite changes, fevers, chills, fatigue, unexplained weight changes. Denies hearing loss, neck lumps or masses, mouth sores, ringing in ears or voice changes. Denies cough or wheezing.  Denies shortness of breath. Denies chest pain or palpitations. Denies leg swelling. Denies abdominal distention, pain, blood in stools, constipation, diarrhea, nausea, vomiting, or early satiety. Denies pain with intercourse, dysuria, frequency, hematuria or incontinence. Denies hot flashes, pelvic pain.   Denies joint pain, back pain or muscle  pain/cramps. Denies itching, rash, or wounds. Denies dizziness, headaches, numbness or seizures. Denies swollen lymph nodes or glands, denies easy bruising or bleeding. Denies anxiety, depression, confusion, or decreased concentration.  Physical Exam:  Vital Signs for this encounter:  Blood pressure (!) 144/74, pulse 87, temperature 98 F (36.7 C), temperature source Temporal, resp. rate 16, height 5' 2.5" (1.588 m), weight 128 lb 4 oz (58.2 kg), SpO2 100 %. Body mass index is 23.08 kg/m. General: Alert, oriented, no acute distress.  HEENT: Normocephalic, atraumatic. Sclera anicteric.  Chest: Clear to auscultation bilaterally.  No wheezes rhonchi or rales. Cardiovascular: Regular rate and rhythm, no murmurs, rubs, or gallops.  Abdomen: Normoactive bowel sounds. Soft, nondistended,  nontender to palpation. No masses or hepatosplenomegaly appreciated. No palpable fluid wave.  Extremities: Grossly normal range of motion. Warm, well perfused. No edema bilaterally.  Skin: No rashes or lesions.  Lymphatics: No cervical, supraclavicular, or inguinal adenopathy.  GU:  Normal external female genitalia. No lesions. No discharge or bleeding.             Bladder/urethra:  No lesions or masses, well supported bladder             Vagina: Moderate atrophy noted, no bleeding or discharge.             Cervix: Atrophic.  No obvious lesions.  Procedure: after discussing the risks of the procedure, the patient gave verbal consent. Colposcopy performed and after application of acetic acid, some mild acetowhite changes noted diffusely on the cervix, possibly consistent with just atrophic areas.  No atypical vasculature, punctations or mosaicism noted.  Random biopsies taken from 1 and 7:00.  Gentle ECC performed with Cytobrush. Hemostasis noted at end of procedure. Patient tolerated it well.  On exam, no nodularity or firmness of the cervix noted.             Uterus:  Small, mobile, no parametrial involvement or  nodularity.             Adnexa: No masses.  Rectal: no parametrial involvement.  LABORATORY AND RADIOLOGIC DATA:  Outside medical records were reviewed to synthesize the above history, along with the history and physical obtained during the visit.   EMB on 12/21: CIN3/CIS, invasion can't be determined  Pap on 12/15: SCC, +HPV16, +Trichomonas

## 2019-12-19 ENCOUNTER — Other Ambulatory Visit: Payer: Self-pay

## 2019-12-19 ENCOUNTER — Inpatient Hospital Stay: Payer: Medicare HMO | Attending: Gynecologic Oncology | Admitting: Gynecologic Oncology

## 2019-12-19 ENCOUNTER — Encounter: Payer: Self-pay | Admitting: Gynecologic Oncology

## 2019-12-19 VITALS — BP 144/74 | HR 87 | Temp 98.0°F | Resp 16 | Ht 62.5 in | Wt 128.2 lb

## 2019-12-19 DIAGNOSIS — I10 Essential (primary) hypertension: Secondary | ICD-10-CM | POA: Diagnosis not present

## 2019-12-19 DIAGNOSIS — Z8249 Family history of ischemic heart disease and other diseases of the circulatory system: Secondary | ICD-10-CM | POA: Insufficient documentation

## 2019-12-19 DIAGNOSIS — N950 Postmenopausal bleeding: Secondary | ICD-10-CM | POA: Diagnosis not present

## 2019-12-19 DIAGNOSIS — Z79899 Other long term (current) drug therapy: Secondary | ICD-10-CM | POA: Insufficient documentation

## 2019-12-19 DIAGNOSIS — D069 Carcinoma in situ of cervix, unspecified: Secondary | ICD-10-CM | POA: Diagnosis not present

## 2019-12-19 DIAGNOSIS — R6889 Other general symptoms and signs: Secondary | ICD-10-CM | POA: Diagnosis not present

## 2019-12-19 DIAGNOSIS — N879 Dysplasia of cervix uteri, unspecified: Secondary | ICD-10-CM | POA: Insufficient documentation

## 2019-12-19 DIAGNOSIS — F17210 Nicotine dependence, cigarettes, uncomplicated: Secondary | ICD-10-CM | POA: Diagnosis not present

## 2019-12-19 DIAGNOSIS — Z8261 Family history of arthritis: Secondary | ICD-10-CM | POA: Insufficient documentation

## 2019-12-19 DIAGNOSIS — Z86010 Personal history of colonic polyps: Secondary | ICD-10-CM | POA: Diagnosis not present

## 2019-12-19 DIAGNOSIS — Z8719 Personal history of other diseases of the digestive system: Secondary | ICD-10-CM | POA: Diagnosis not present

## 2019-12-19 DIAGNOSIS — K5900 Constipation, unspecified: Secondary | ICD-10-CM | POA: Insufficient documentation

## 2019-12-19 NOTE — Addendum Note (Signed)
Addended by: Joylene John D on: 12/19/2019 10:55 AM   Modules accepted: Orders

## 2019-12-19 NOTE — Patient Instructions (Addendum)
It was a pleasure meeting you today.  I will call you when your biopsy results are back.  Depending on the results, we may need to do an outpatient surgery to get a bigger biopsy of the cervix.  I am going to go ahead and schedule this for next week so that we do not lose that time and we can always cancel it if unnecessary based on the biopsy results.  If it is decided that a conization biopsy of the cervix is needed:  Plan to have a conization of the cervix at the [facility] on December 27, 2019.  You will receive a phone call from the pre-surgical RN to discuss instructions.  Please call for any questions or concerns.

## 2019-12-20 ENCOUNTER — Encounter (HOSPITAL_BASED_OUTPATIENT_CLINIC_OR_DEPARTMENT_OTHER): Payer: Self-pay | Admitting: Gynecologic Oncology

## 2019-12-24 ENCOUNTER — Telehealth: Payer: Self-pay | Admitting: Gynecologic Oncology

## 2019-12-24 ENCOUNTER — Telehealth: Payer: Self-pay | Admitting: *Deleted

## 2019-12-24 ENCOUNTER — Other Ambulatory Visit (HOSPITAL_COMMUNITY)
Admission: RE | Admit: 2019-12-24 | Discharge: 2019-12-24 | Disposition: A | Discharge status: Home / Self Care | Payer: Medicare HMO | Source: Ambulatory Visit | Attending: Gynecologic Oncology | Admitting: Gynecologic Oncology

## 2019-12-24 DIAGNOSIS — Z20822 Contact with and (suspected) exposure to covid-19: Secondary | ICD-10-CM | POA: Diagnosis not present

## 2019-12-24 DIAGNOSIS — Z01812 Encounter for preprocedural laboratory examination: Secondary | ICD-10-CM | POA: Insufficient documentation

## 2019-12-24 LAB — SURGICAL PATHOLOGY

## 2019-12-24 NOTE — Telephone Encounter (Signed)
Call patient with biopsy results.  2 cervical biopsies showed CIN-1, endocervical curettage showed CIN-2/3.  Given high-grade dysplasia of the endocervix, recommend still proceeding with cold knife cone on Thursday this week as scheduled.  Patient is currently in line to get her Covid test.  Amenable to plan.  Jeral Pinch MD Gynecologic Oncology

## 2019-12-24 NOTE — Telephone Encounter (Signed)
Patient called and left a message to check her results from 12/30; and to see if she needs to keep her COVID test. Per Melissa APP results not back and to keep hre COVID test

## 2019-12-25 ENCOUNTER — Encounter (HOSPITAL_BASED_OUTPATIENT_CLINIC_OR_DEPARTMENT_OTHER): Payer: Self-pay | Admitting: Gynecologic Oncology

## 2019-12-25 ENCOUNTER — Telehealth: Payer: Self-pay | Admitting: Oncology

## 2019-12-25 ENCOUNTER — Other Ambulatory Visit: Payer: Self-pay

## 2019-12-25 LAB — NOVEL CORONAVIRUS, NAA (HOSP ORDER, SEND-OUT TO REF LAB; TAT 18-24 HRS): SARS-CoV-2, NAA: NOT DETECTED

## 2019-12-25 NOTE — Telephone Encounter (Signed)
[facility]ed [facility] and scheduled echo and carotid at 10 am tomorrow at [facility].  [facility] and advised her of appointment time and location and that she will need the echo done before her procedure on Thursday. She verbalized agreement and understanding.

## 2019-12-25 NOTE — Progress Notes (Addendum)
ADDENDUM:  Chart reviewed by anesthesia, Konrad Felix PA,  Stated pt needs echo done prior to surgery.  Called and spoke w/ Joylene John NP via phone informed of pt seeing Dr Gwenlyn Found for chest pain 12-18-2019 and anesthesia needs pt to have echo prior to surgery.  ADDENDUM:  Pt had carotid doppler and echo done today. Results in epic.  Per anesthesia , Konrad Felix PA and Dr Gifford Shave MDA, ok to proceed. Called and spoke w/ Joylene John NP and informed her pt ok to proceed for surgery.   Spoke w/ via phone for pre-op interview--- PT Lab needs dos---- CBC, BMP              Lab results------ current ekg in epic/ chart COVID test ------ 12-24-2019 Arrive at ------- 0900 NPO after ------ MN w/ exception clear liquids until 0800 then nothing by mouth (no cream/ milk products) Medications to take morning of surgery ----- NONE Diabetic medication ----- n/a Patient Special Instructions ----- n/a Pre-Op special Istructions ----- n/a Patient verbalized understanding of instructions that were given at this phone interview. Patient denies shortness of breath, chest pain, fever, cough a this phone interview.   Anesthesia Review:  Pt w/ PAD lower extremities w/ claudication.  HTN.  Pt compliant of chest pain per pt pcp note 11-28-2019 and referred to cardiology, Dr Gwenlyn Found 12-18-2019.  Pt denies any chest pain or any cardiac symptoms since approx. 12-05-2019 (week after pcp visit)  PCP:  Dr Pricilla Holm (lov 11-28-2019 epic)  Cardiologist :  Dr. Gwenlyn Found (lov 12-18-2019 epic)  Vascular:  Dr Trula Slade (lov 12-25-2018)  Chest x-ray : 06-04-2011 epic EKG : 11-28-2019 epic Echo : no (pt has one scheduled 01-03-2020) Carotid doppler:  Pt has one scheduled 01-07-2020 Stress test:  no Cardiac Cath :  no Sleep Study/ CPAP :  no Fasting Blood Sugar :      / Checks Blood Sugar -- times a day:    N/A  Blood Thinner/ Instructions /Last Dose:  NO ASA / Instructions/ Last Dose :  ASA 81 mg/  Per pt was not  given instructions to stop or not from Dr Berline Lopes office/  Advised pt since asa was not on her medication list they probably were not aware she was taking asa, she is to call Dr Berline Lopes office and ask about stopping ASA.

## 2019-12-25 NOTE — Progress Notes (Addendum)
Anesthesia Chart Review   Case: [id number] Date/Time: 12/27/19 1045   Procedures:      COLD KNIFE CONIZATION CERVIX WITH BIOPSY (N/A )     POSSIBLE ENDOMETRIAL BIOPSY (N/A )     POSSIBLE ENDOCERVICAL CURETTAGE (N/A )   Anesthesia type: Choice   Pre-op diagnosis: CERVICAL CANCER   Location: [facility] OR ROOM 4. / [place]   Surgeons: Lafonda Mosses, MD      DISCUSSION:74 y.o. current every day smoker with h/o GERD, HTN, HLD, CKD Stage III, cervical cancer scheduled for above procedure 12/27/2019 with Dr. Jeral Pinch.   Followed by vascular surgery for lower extremity claudication.  Last seen 12/25/2018.  Per OV note pt is tolerating claudication, no intervention until she stops smoking.  She is to follow up in 1 year.   Seen by cardiology 12/18/2019, stable at this visit. Per OV note pt denies chest pain.  Echo and carotid doppler study have been ordered, currently scheduled after above procedure. Soft murmur consistent with aortic stenosis heard on exam at this visit.    Addendum 12/26/2019 4:16PM  Echo with EF 70-75%, mild to moderate aortic valve sclerosis without evidence of aortic stenosis.  VS: Ht 5' 2.5" (1.588 m)   Wt 58.2 kg   BMI 23.09 kg/m   PROVIDERS: [name], MD is PCP last seen 11/28/2019  Quay Burow, MD is Cardiologist    Harold Barban, MD is Vascular Surgeon LABS: labs DOS (all labs ordered are listed, but only abnormal results are displayed)  Labs Reviewed - No data to display   IMAGES:   EKG: 11/28/2019 Rate 86 bpm  Nonspecific ST depression  Nonspecific T abnormality  CV:  Past Medical History:  Diagnosis Date  . Arthritis   . Atherosclerosis of native artery of both lower extremities with intermittent claudication [facility])    vascular--- dr Trula Slade--- last ABIs 07-11-2018 in epic  . Cervical cancer (HCC)    squamous cell carcinoma   . Cervical spondylosis   . Chest pain 12-24-2019  per pt no chest pain since  approx. 12-05-2019, pt stated only had chest pain (no nausea, palpitations, irregular heartbeat, sob, difficulty breathing, sweating , or peripheral swelling)   per pt pcp note 11-28-2019, Dr Pricilla Holm, pt complaint with chest pain, Dr Sharlet Salina noted changes on her EKG done that day and with pt at high risk pt referred to cardiology/  pt was seen by Dr Gwenlyn Found 12-18-2019, note in epic, pt denied chest pain at this visit,  Dr Gwenlyn Found ordered carotid dopplers and echo and pt has appointment's scheduled  . Chronic neck and back pain    midline thoracic back pain  . CKD (chronic kidney disease), stage III   . Dry eyes   . Full dentures   . GERD (gastroesophageal reflux disease)    12-24-2019 per pt occasionally ,  drinks water  . Heart murmur    per Dr Gwenlyn Found assessment/ note on 12-18-2019  . History of cervical dysplasia    per pt at age 58s had cervix "scraped"  . History of trichomonal vaginitis    12-04-2019  per pap smear,  treated  . Hx of adenomatous polyp of colon 03/06/2015  . Hyperlipidemia   . Hypertension    followed by pcp   (12-24-2019 per pt never had a stress test)  . IDA (iron deficiency anemia)   . Postmenopausal bleeding   . Spondylolisthesis of cervicothoracic region    steroid injection 08-23-2019  Past Surgical History:  Procedure Laterality Date  . CATARACT EXTRACTION W/ INTRAOCULAR LENS IMPLANT Right 2018  . COLONOSCOPY  02/2015  . HEMORRHOID SURGERY  1970s  . ORIF METATARSAL FRACTURE Left 06-04-2011   dr hewitt  @[facility]    ORIF 3rd and 5th metatorsal shaft fractures/  closed treatment of 1st, 2nd, and 4th metatorsal fractures  . TUBAL LIGATION  yrs ago    MEDICATIONS: No current facility-administered medications for this encounter.   Marland Kitchen amLODipine (NORVASC) 10 MG tablet  . aspirin EC 81 MG tablet  . atorvastatin (LIPITOR) 20 MG tablet  . calcium-vitamin D (OSCAL WITH D) 250-125 MG-UNIT tablet  . ferrous sulfate 324 MG TBEC  . Polyethyl Glycol-Propyl  Glycol (SYSTANE OP)    [facility] Pre-Surgical Testing [phone] 12/25/19  3:25 PM

## 2019-12-26 ENCOUNTER — Telehealth: Payer: Self-pay

## 2019-12-26 ENCOUNTER — Ambulatory Visit (HOSPITAL_BASED_OUTPATIENT_CLINIC_OR_DEPARTMENT_OTHER)
Admission: RE | Admit: 2019-12-26 | Discharge: 2019-12-26 | Disposition: A | Discharge status: Home / Self Care | Payer: Medicare HMO | Source: Ambulatory Visit | Attending: Cardiovascular Disease | Admitting: Cardiovascular Disease

## 2019-12-26 ENCOUNTER — Ambulatory Visit (HOSPITAL_COMMUNITY)
Admission: RE | Admit: 2019-12-26 | Discharge: 2019-12-26 | Disposition: A | Discharge status: Home / Self Care | Payer: Medicare HMO | Source: Ambulatory Visit | Attending: Cardiovascular Disease | Admitting: Cardiovascular Disease

## 2019-12-26 ENCOUNTER — Encounter (HOSPITAL_BASED_OUTPATIENT_CLINIC_OR_DEPARTMENT_OTHER): Payer: Self-pay | Admitting: Gynecologic Oncology

## 2019-12-26 ENCOUNTER — Other Ambulatory Visit: Payer: Self-pay

## 2019-12-26 DIAGNOSIS — I739 Peripheral vascular disease, unspecified: Secondary | ICD-10-CM

## 2019-12-26 DIAGNOSIS — I358 Other nonrheumatic aortic valve disorders: Secondary | ICD-10-CM | POA: Insufficient documentation

## 2019-12-26 DIAGNOSIS — I6523 Occlusion and stenosis of bilateral carotid arteries: Secondary | ICD-10-CM | POA: Diagnosis not present

## 2019-12-26 DIAGNOSIS — F17200 Nicotine dependence, unspecified, uncomplicated: Secondary | ICD-10-CM | POA: Diagnosis not present

## 2019-12-26 DIAGNOSIS — R011 Cardiac murmur, unspecified: Secondary | ICD-10-CM | POA: Diagnosis not present

## 2019-12-26 DIAGNOSIS — R0989 Other specified symptoms and signs involving the circulatory and respiratory systems: Secondary | ICD-10-CM | POA: Insufficient documentation

## 2019-12-26 DIAGNOSIS — I10 Essential (primary) hypertension: Secondary | ICD-10-CM

## 2019-12-26 NOTE — Telephone Encounter (Signed)
Told Anita [name] that her echo was read and she is fine for her surgery tomorrow per Joylene John, NP. Anita Boettner states that she understands her pre op instructions.

## 2019-12-26 NOTE — Anesthesia Preprocedure Evaluation (Deleted)
Anesthesia Evaluation    Airway        Dental   Pulmonary Current Smoker,           Cardiovascular hypertension,      Neuro/Psych    GI/Hepatic   Endo/Other    Renal/GU      Musculoskeletal   Abdominal   Peds  Hematology   Anesthesia Other Findings   Reproductive/Obstetrics                             Anesthesia Physical Anesthesia Plan  ASA:   Anesthesia Plan:    Post-op Pain Management:    Induction:   PONV Risk Score and Plan:   Airway Management Planned:   Additional Equipment:   Intra-op Plan:   Post-operative Plan:   Informed Consent:   Plan Discussed with:   Anesthesia Plan Comments:    Anesthesia Quick Evaluation  

## 2019-12-26 NOTE — Progress Notes (Signed)
  Echocardiogram 2D Echocardiogram has been performed.  Burnett Kanaris 12/26/2019, 11:45 AM

## 2019-12-27 ENCOUNTER — Ambulatory Visit (HOSPITAL_BASED_OUTPATIENT_CLINIC_OR_DEPARTMENT_OTHER)
Admission: RE | Admit: 2019-12-27 | Discharge: 2019-12-27 | Disposition: A | Discharge status: Home / Self Care | Payer: Medicare HMO | Attending: Gynecologic Oncology | Admitting: Gynecologic Oncology

## 2019-12-27 ENCOUNTER — Other Ambulatory Visit: Payer: Self-pay

## 2019-12-27 ENCOUNTER — Ambulatory Visit (HOSPITAL_BASED_OUTPATIENT_CLINIC_OR_DEPARTMENT_OTHER): Payer: Medicare HMO | Admitting: Physician Assistant

## 2019-12-27 ENCOUNTER — Encounter (HOSPITAL_BASED_OUTPATIENT_CLINIC_OR_DEPARTMENT_OTHER): Payer: Self-pay | Admitting: Gynecologic Oncology

## 2019-12-27 ENCOUNTER — Encounter (HOSPITAL_BASED_OUTPATIENT_CLINIC_OR_DEPARTMENT_OTHER)
Admission: RE | Disposition: A | Discharge status: Home / Self Care | Payer: Self-pay | Source: Home / Self Care | Attending: Gynecologic Oncology

## 2019-12-27 DIAGNOSIS — F17210 Nicotine dependence, cigarettes, uncomplicated: Secondary | ICD-10-CM | POA: Diagnosis not present

## 2019-12-27 DIAGNOSIS — Z7982 Long term (current) use of aspirin: Secondary | ICD-10-CM | POA: Insufficient documentation

## 2019-12-27 DIAGNOSIS — I739 Peripheral vascular disease, unspecified: Secondary | ICD-10-CM | POA: Insufficient documentation

## 2019-12-27 DIAGNOSIS — D069 Carcinoma in situ of cervix, unspecified: Secondary | ICD-10-CM | POA: Insufficient documentation

## 2019-12-27 DIAGNOSIS — E785 Hyperlipidemia, unspecified: Secondary | ICD-10-CM | POA: Diagnosis not present

## 2019-12-27 DIAGNOSIS — I10 Essential (primary) hypertension: Secondary | ICD-10-CM | POA: Diagnosis not present

## 2019-12-27 DIAGNOSIS — M1990 Unspecified osteoarthritis, unspecified site: Secondary | ICD-10-CM | POA: Diagnosis not present

## 2019-12-27 DIAGNOSIS — Z79899 Other long term (current) drug therapy: Secondary | ICD-10-CM | POA: Insufficient documentation

## 2019-12-27 DIAGNOSIS — N183 Chronic kidney disease, stage 3 unspecified: Secondary | ICD-10-CM | POA: Insufficient documentation

## 2019-12-27 DIAGNOSIS — N950 Postmenopausal bleeding: Secondary | ICD-10-CM | POA: Diagnosis not present

## 2019-12-27 DIAGNOSIS — C539 Malignant neoplasm of cervix uteri, unspecified: Secondary | ICD-10-CM | POA: Diagnosis not present

## 2019-12-27 DIAGNOSIS — N879 Dysplasia of cervix uteri, unspecified: Secondary | ICD-10-CM

## 2019-12-27 DIAGNOSIS — I129 Hypertensive chronic kidney disease with stage 1 through stage 4 chronic kidney disease, or unspecified chronic kidney disease: Secondary | ICD-10-CM | POA: Diagnosis not present

## 2019-12-27 HISTORY — DX: Dorsalgia, unspecified: M54.2

## 2019-12-27 HISTORY — PX: DILATION AND CURETTAGE OF UTERUS: SHX78

## 2019-12-27 HISTORY — DX: Cardiac murmur, unspecified: R01.1

## 2019-12-27 HISTORY — DX: Atherosclerosis of native arteries of extremities with intermittent claudication, bilateral legs: I70.213

## 2019-12-27 HISTORY — DX: Iron deficiency anemia, unspecified: D50.9

## 2019-12-27 HISTORY — DX: Postmenopausal bleeding: N95.0

## 2019-12-27 HISTORY — DX: Complete loss of teeth, unspecified cause, unspecified class: K08.109

## 2019-12-27 HISTORY — DX: Other chronic pain: G89.29

## 2019-12-27 HISTORY — DX: Malignant neoplasm of cervix uteri, unspecified: C53.9

## 2019-12-27 HISTORY — DX: Chronic kidney disease, stage 3 unspecified: N18.30

## 2019-12-27 HISTORY — PX: CERVICAL CONIZATION W/BX: SHX1330

## 2019-12-27 HISTORY — DX: Hyperlipidemia, unspecified: E78.5

## 2019-12-27 HISTORY — DX: Personal history of other infectious and parasitic diseases: Z86.19

## 2019-12-27 HISTORY — DX: Spondylolisthesis, cervicothoracic region: M43.13

## 2019-12-27 HISTORY — DX: Personal history of cervical dysplasia: Z87.410

## 2019-12-27 LAB — BASIC METABOLIC PANEL
Anion gap: 8 (ref 5–15)
BUN: 57 mg/dL — ABNORMAL HIGH (ref 8–23)
CO2: 21 mmol/L — ABNORMAL LOW (ref 22–32)
Calcium: 10.4 mg/dL — ABNORMAL HIGH (ref 8.9–10.3)
Chloride: 104 mmol/L (ref 98–111)
Creatinine, Ser: 1.58 mg/dL — ABNORMAL HIGH (ref 0.44–1.00)
GFR calc Af Amer: 38 mL/min — ABNORMAL LOW (ref >60)
GFR calc non Af Amer: 33 mL/min — ABNORMAL LOW (ref >60)
Glucose, Bld: 91 mg/dL (ref 70–99)
Potassium: 5.4 mmol/L — ABNORMAL HIGH (ref 3.5–5.1)
Sodium: 133 mmol/L — ABNORMAL LOW (ref 135–145)

## 2019-12-27 LAB — CBC
HCT: 31.5 % — ABNORMAL LOW (ref 36.0–46.0)
Hemoglobin: 10.4 g/dL — ABNORMAL LOW (ref 12.0–15.0)
MCH: 28 pg (ref 26.0–34.0)
MCHC: 33 g/dL (ref 30.0–36.0)
MCV: 84.7 fL (ref 80.0–100.0)
Platelets: 194 10*3/uL (ref 150–400)
RBC: 3.72 MIL/uL — ABNORMAL LOW (ref 3.87–5.11)
RDW: 22 % — ABNORMAL HIGH (ref 11.5–15.5)
WBC: 5.8 10*3/uL (ref 4.0–10.5)
nRBC: 0 % (ref 0.0–0.2)

## 2019-12-27 SURGERY — CONE BIOPSY, CERVIX
Anesthesia: General | Site: Cervix

## 2019-12-27 MED ORDER — ACETIC ACID 5 % SOLN
Status: DC | PRN
  Administered 2019-12-27: 1 via TOPICAL

## 2019-12-27 MED ORDER — ACETAMINOPHEN 325 MG PO TABS: 325.0000 mg | ORAL_TABLET | Freq: Four times a day (QID) | ORAL | 0 refills | Status: DC | PRN

## 2019-12-27 MED ORDER — FERRIC SUBSULFATE SOLN
Status: DC | PRN
  Administered 2019-12-27: 1

## 2019-12-27 MED ORDER — DEXAMETHASONE SODIUM PHOSPHATE 10 MG/ML IJ SOLN
INTRAMUSCULAR | Status: DC | PRN
  Administered 2019-12-27: 5 mg via INTRAVENOUS

## 2019-12-27 MED ORDER — MIDAZOLAM HCL 5 MG/5ML IJ SOLN
INTRAMUSCULAR | Status: DC | PRN
  Administered 2019-12-27: 2 mg via INTRAVENOUS

## 2019-12-27 MED ORDER — SENNOSIDES-DOCUSATE SODIUM 8.6-50 MG PO TABS: 1.0000 | ORAL_TABLET | Freq: Every day | ORAL | 0 refills | Status: AC

## 2019-12-27 MED ORDER — FENTANYL CITRATE (PF) 100 MCG/2ML IJ SOLN
INTRAMUSCULAR | Status: DC | PRN
  Administered 2019-12-27 (×4): 25 ug via INTRAVENOUS

## 2019-12-27 MED ORDER — PROPOFOL 10 MG/ML IV BOLUS
INTRAVENOUS | Status: AC
  Filled 2019-12-27: qty 20

## 2019-12-27 MED ORDER — LIDOCAINE 2% (20 MG/ML) 5 ML SYRINGE
INTRAMUSCULAR | Status: AC
  Filled 2019-12-27: qty 5

## 2019-12-27 MED ORDER — OXYCODONE HCL 5 MG/5ML PO SOLN
5.0000 mg | Freq: Once | ORAL | Status: AC | PRN
  Filled 2019-12-27: qty 5

## 2019-12-27 MED ORDER — LIDOCAINE HCL (CARDIAC) PF 100 MG/5ML IV SOSY
PREFILLED_SYRINGE | INTRAVENOUS | Status: DC | PRN
  Administered 2019-12-27: 60 mg via INTRAVENOUS

## 2019-12-27 MED ORDER — KETOROLAC TROMETHAMINE 30 MG/ML IJ SOLN
INTRAMUSCULAR | Status: AC
  Filled 2019-12-27: qty 1

## 2019-12-27 MED ORDER — FENTANYL CITRATE (PF) 100 MCG/2ML IJ SOLN
INTRAMUSCULAR | Status: AC
  Filled 2019-12-27: qty 2

## 2019-12-27 MED ORDER — OXYCODONE-ACETAMINOPHEN 5-325 MG PO TABS
ORAL_TABLET | ORAL | Status: AC
  Filled 2019-12-27: qty 1

## 2019-12-27 MED ORDER — MIDAZOLAM HCL 2 MG/2ML IJ SOLN
INTRAMUSCULAR | Status: AC
  Filled 2019-12-27: qty 2

## 2019-12-27 MED ORDER — ONDANSETRON HCL 4 MG/2ML IJ SOLN
INTRAMUSCULAR | Status: AC
  Filled 2019-12-27: qty 2

## 2019-12-27 MED ORDER — KETOROLAC TROMETHAMINE 30 MG/ML IJ SOLN
INTRAMUSCULAR | Status: DC | PRN
  Administered 2019-12-27: 15 mg via INTRAVENOUS

## 2019-12-27 MED ORDER — SODIUM CHLORIDE 0.9 % IV SOLN
INTRAVENOUS | Status: DC
  Filled 2019-12-27: qty 1000

## 2019-12-27 MED ORDER — OXYCODONE HCL 5 MG PO TABS
5.0000 mg | ORAL_TABLET | Freq: Once | ORAL | Status: AC | PRN
  Administered 2019-12-27: 13:00:00 5 mg via ORAL
  Filled 2019-12-27: qty 1

## 2019-12-27 MED ORDER — ONDANSETRON HCL 4 MG/2ML IJ SOLN
INTRAMUSCULAR | Status: DC | PRN
  Administered 2019-12-27: 4 mg via INTRAVENOUS

## 2019-12-27 MED ORDER — ONDANSETRON HCL 4 MG/2ML IJ SOLN
4.0000 mg | Freq: Once | INTRAMUSCULAR | Status: DC | PRN
  Filled 2019-12-27: qty 2

## 2019-12-27 MED ORDER — LIDOCAINE-EPINEPHRINE 1 %-1:100000 IJ SOLN
INTRAMUSCULAR | Status: DC | PRN
  Administered 2019-12-27: 10 mL

## 2019-12-27 MED ORDER — FENTANYL CITRATE (PF) 100 MCG/2ML IJ SOLN
25.0000 ug | INTRAMUSCULAR | Status: DC | PRN
  Filled 2019-12-27: qty 1

## 2019-12-27 MED ORDER — DEXAMETHASONE SODIUM PHOSPHATE 10 MG/ML IJ SOLN
INTRAMUSCULAR | Status: AC
  Filled 2019-12-27: qty 1

## 2019-12-27 MED ORDER — HEMOSTATIC AGENTS (NO CHARGE) OPTIME
TOPICAL | Status: DC | PRN
  Administered 2019-12-27: 1

## 2019-12-27 MED ORDER — PROPOFOL 10 MG/ML IV BOLUS
INTRAVENOUS | Status: DC | PRN
  Administered 2019-12-27: 150 mg via INTRAVENOUS

## 2019-12-27 MED ORDER — OXYCODONE HCL 5 MG PO TABS
ORAL_TABLET | ORAL | Status: AC
  Filled 2019-12-27: qty 1

## 2019-12-27 MED ORDER — OXYCODONE HCL 5 MG PO CAPS: 5.0000 mg | ORAL_CAPSULE | ORAL | 0 refills | Status: DC | PRN

## 2019-12-27 SURGICAL SUPPLY — 29 items
APPLICATOR COTTON TIP 6 STRL (MISCELLANEOUS) ×2 IMPLANT
APPLICATOR COTTON TIP 6IN STRL (MISCELLANEOUS) ×4
BLADE EXTENDED COATED 6.5IN (ELECTRODE) ×4 IMPLANT
BLADE SURG 11 STRL SS (BLADE) ×4 IMPLANT
CANISTER SUCT 3000ML PPV (MISCELLANEOUS) IMPLANT
CATH ROBINSON RED A/P 16FR (CATHETERS) ×4 IMPLANT
CONT SPEC 4OZ CLIKSEAL STRL BL (MISCELLANEOUS) ×4 IMPLANT
COVER WAND RF STERILE (DRAPES) ×4 IMPLANT
DRSG TELFA 3X8 NADH (GAUZE/BANDAGES/DRESSINGS) ×4 IMPLANT
ELECT BALL LEEP 5MM RED (ELECTRODE) IMPLANT
GAUZE 4X4 16PLY RFD (DISPOSABLE) ×4 IMPLANT
GLOVE BIO SURGEON STRL SZ 6 (GLOVE) ×4 IMPLANT
GOWN STRL REUS W/ TWL LRG LVL3 (GOWN DISPOSABLE) ×2 IMPLANT
GOWN STRL REUS W/TWL LRG LVL3 (GOWN DISPOSABLE) ×2
HEMOSTAT SURGICEL 4X8 (HEMOSTASIS) ×4 IMPLANT
KIT TURNOVER CYSTO (KITS) ×4 IMPLANT
NS IRRIG 500ML POUR BTL (IV SOLUTION) ×4 IMPLANT
PACK VAGINAL WOMENS (CUSTOM PROCEDURE TRAY) ×4 IMPLANT
PAD PREP 24X48 CUFFED NSTRL (MISCELLANEOUS) ×4 IMPLANT
SPONGE SURGIFOAM ABS GEL 12-7 (HEMOSTASIS) IMPLANT
SUT VIC AB 0 CT1 36 (SUTURE) ×8 IMPLANT
SUT VIC AB 2-0 CT2 27 (SUTURE) ×4 IMPLANT
SUT VIC AB 2-0 UR6 27 (SUTURE) IMPLANT
SWAB OB GYN 8IN STERILE 2PK (MISCELLANEOUS) IMPLANT
SYR BULB IRRIGATION 50ML (SYRINGE) ×4 IMPLANT
TOWEL OR 17X26 10 PK STRL BLUE (TOWEL DISPOSABLE) ×4 IMPLANT
TUBE CONNECTING 12'X1/4 (SUCTIONS) ×1
TUBE CONNECTING 12X1/4 (SUCTIONS) ×3 IMPLANT
VACUUM HOSE/TUBING 7/8INX6FT (MISCELLANEOUS) IMPLANT

## 2019-12-27 NOTE — Discharge Instructions (Signed)
Cervical Conization, Care After This sheet gives you information about how to care for yourself after your procedure. Your doctor may also give you more specific instructions. If you have problems or questions, contact your doctor. What can I expect after the procedure? After the procedure, it is common to have:  A groggy feeling, if you were given medicine to make you fall asleep (general anesthetic).  Cramps that feel like menstrual cramps.  Bloody discharge or light to moderate bleeding.  Dark fluid from the vagina (vaginal discharge). This fluid may look similar to coffee grounds. Follow these instructions at home: Medicines  Take over-the-counter and prescription medicines only as told by your doctor.  Do not take aspirin until your doctor says it is okay. Activity   Rest as told by your doctor.  For 7-14 days after your procedure, avoid: ? Being very active. ? Exercising. ? Heavy lifting.  Do not lift anything that is heavier than 10 lb (4.5 kg), or the limit that you are told, until your doctor says that it is safe.  Return to your normal activities as told by your doctor. Ask your doctor what activities are safe for you. General instructions   You may eat your normal diet unless your doctor tells you not to do so.  Drink enough fluid to keep your pee (urine) pale yellow.  Do not take baths, swim, or use a hot tub until your doctor approves. Ask your doctor if you may take showers. You may only be allowed to take sponge baths.  Do not douche, have sex, or put anything in the vagina, including tampons, until your doctor says it is okay.  Keep all follow-up visits as told by your doctor. This is important. Contact a doctor if:  You get a rash.  You are dizzy or light-headed.  You feel like you may vomit (nauseous).  You vomit.  You have fluid from your vagina that smells bad. Get help right away if:  There are bloody clumps (clots) coming from your  vagina.  You have more bleeding than you would have in a normal menstrual period. For example, you soak a pad in less than 1 hour.  You have a fever.  You have more and more cramps.  You pass out (faint).  You have pain when peeing.  You have very bad pain, or your pain gets worse. Medicine does not help your pain.  You have blood in your pee. Summary  After the procedure, it is common to have cramps, some bleeding, and dark or bloody fluid from your vagina.  Do not douche, have sex, or use tampons until your doctor says it is okay.  For about 7-14 days after your procedure, try not to exercise or lift heavy objects. This information is not intended to replace advice given to you by your health care provider. Make sure you discuss any questions you have with your health care provider. Document Revised: 06/04/2019 Document Reviewed: 06/04/2019 Elsevier Patient Education  [place] Instructions  Activity: Get plenty of rest for the remainder of the day. A responsible individual must stay with you for 24 hours following the procedure.  For the next 24 hours, DO NOT: -Drive a car -Paediatric nurse -Drink alcoholic beverages -Take any medication unless instructed by your physician -Make any legal decisions or sign important papers.  Meals: Start with liquid foods such as gelatin or soup. Progress to regular foods as tolerated. Avoid greasy, spicy,  heavy foods. If nausea and/or vomiting occur, drink only clear liquids until the nausea and/or vomiting subsides. Call your physician if vomiting continues.  Special Instructions/Symptoms: Your throat may feel dry or sore from the anesthesia or the breathing tube placed in your throat during surgery. If this causes discomfort, gargle with warm salt water. The discomfort should disappear within 24 hours.  If you had a scopolamine patch placed behind your ear for the management of post- operative  nausea and/or vomiting:  1. The medication in the patch is effective for 72 hours, after which it should be removed.  Wrap patch in a tissue and discard in the trash. Wash hands thoroughly with soap and water. 2. You may remove the patch earlier than 72 hours if you experience unpleasant side effects which may include dry mouth, dizziness or visual disturbances. 3. Avoid touching the patch. Wash your hands with soap and water after contact with the patch.    Prescriptions were sent to your pharmacy on file  12/27/2019  Activity: 1. Be up and out of the bed during the day.  Take a nap if needed.  You may walk up steps but be careful and use the hand rail.  Stair climbing will tire you more than you think, you may need to stop part way and rest.   2. No lifting or straining for 6 weeks.  3. No driving for 4-7 days.  Do Not drive if you are taking narcotic pain medicine.  4. Shower daily.  Use soap and water on your incision and pat dry; don't rub.   5. No sexual activity and nothing in the vagina for 6 weeks.  Medications:  - Take ibuprofen (if you normally take this) and tylenol first line for pain control.   - If necessary, for severe pain not relieved by other medications, take oxycodone.  - If taking oxycodone, you should take sennakot every night to reduce the likelihood of constipation. If this causes diarrhea, stop its use.  Diet: 1. Low sodium Heart Healthy Diet is recommended.  2. It is safe to use a laxative if you have difficulty moving your bowels.   Reasons to call the Doctor:   Fever - Oral temperature greater than 100.4 degrees Fahrenheit  Foul-smelling vaginal discharge  Difficulty urinating  Nausea and vomiting  Difficulty breathing with or without chest pain  New calf pain especially if only on one side  Sudden, continuing increased vaginal bleeding with or without clots.   Follow-up: 1. See Dr. Berline Lopes in 3-4 weeks.  Contacts: For questions or  concerns you should contact:  Dr. Jeral Pinch at [phone] After hours and on week-ends call [phone] and ask to speak to the physician on call for Gynecologic Oncology

## 2019-12-27 NOTE — Anesthesia Procedure Notes (Signed)
Procedure Name: LMA Insertion Date/Time: 12/27/2019 10:40 AM Performed by: Justice Rocher, CRNA Pre-anesthesia Checklist: Patient identified, Emergency Drugs available, Suction available and Patient being monitored Patient Re-evaluated:Patient Re-evaluated prior to induction Oxygen Delivery Method: Circle system utilized Preoxygenation: Pre-oxygenation with 100% oxygen Induction Type: IV induction Ventilation: Mask ventilation without difficulty LMA: LMA inserted LMA Size: 4.0 Number of attempts: 1 Airway Equipment and Method: Bite block Placement Confirmation: positive ETCO2 and breath sounds checked- equal and bilateral Tube secured with: Tape Dental Injury: Teeth and Oropharynx as per pre-operative assessment

## 2019-12-27 NOTE — Op Note (Signed)
PATIENT: Anita [name] DATE: 12/27/19   Preop Diagnosis: High-grade dysplasia  Postoperative Diagnosis: Same as above  Surgery: cold knife conization of cervix, endocervical curettage, left vaginal fornix biopsy  Surgeons:  Jeral Pinch MD Assistant: none  Anesthesia: General   Estimated blood loss: 10 ml  IVF:  See anesthesia flowsheet   Urine output: none, bladder not emptied during surgery   Complications: None apparent  Pathology: CKC with marking stitch at 12 o'clock, post cone ECC, biopsy of left vaginal fornix 3 o'clock  Operative findings: Small atrophic cervix, somewhat flush with the vagina.  Significant atrophy of the cervix and upper vagina, notably the fornices, appreciated on speculum exam.  After application of acetic acid, no obvious acetowhite epithelial changes.  External cervical os mildly dilated.  No parametrial thickening or nodularity on rectovaginal exam.  Procedure: The patient was identified in the preoperative holding area. Informed consent was signed on the chart. Patient was seen history was reviewed and exam was performed.   The patient was then taken to the operating room and placed in the supine position with SCD hose on. General anesthesia was then induced without difficulty. She was then placed in the dorsolithotomy position. The perineum was prepped with Betadine. The vagina was prepped with Hibiclens. The patient was then draped after the prep was dried. An in and out catheterization to empty the bladder was performed under sterile conditions.  Timeout was performed the patient, procedure, antibiotic, allergy, and length of procedure.   The speculum was placed in the vagina.  A paracervical block with 1% lidocaine with epinephrine was performed at 4 and 8:00 at the cervicovaginal junction.  A 0-vicryl suture on a UR-6 needle was used to place stay sutures (which were tagged) at 3 and 9 o'clock of the cervicovaginal junction. The uterine  sound was placed in the cervix to delineate the course of the endocervical canal. An 11 blade scalpel on a long knife handle was used to make an incision around the face of the cervix, inside of the stay sutures, circumferentially. An Danton Clap was used to grasp the specimen to manipulate it. The incision was then angled towards the endocervix to amputate the specimen using Mayo scissors. It was removed, oriented with a marking stitch at the 12 o'clock ectocervix and sent to pathology.  Given the small size of the cervix, I rather narrow cone was performed.  A post-cone ECC was collected from the endocervical canal using a kavorkian currette.  A biopsy was taken from the left vaginal fornix at approximately 3:00 using the biopsy forceps.  A shallow figure-of-eight stitch with 2-0 Vicryl was used to achieve hemostasis of the biopsy site.  The bovie was used at 22 coag to create hemostasis at the surgical bed. Monsels solution was applied to the surgical bed to consolidate this hemostasis.  Surgicel was placed in the cone bed and the stay sutures were tied together, securing the Surgicel in place.  All instrument, suture, laparotomy, Ray-Tec, and needle counts were correct x2. The patient tolerated the procedure well and was taken recovery room in stable condition.   Jeral Pinch, MD

## 2019-12-27 NOTE — Anesthesia Postprocedure Evaluation (Signed)
Anesthesia Post Note  Patient: Anita [name]  Procedure(s) Performed: COLD KNIFE CONIZATION CERVIX WITH BIOPSY (N/A Cervix) ENDOCERVICAL CURETTAGE (N/A Cervix)     Patient location during evaluation: PACU Anesthesia Type: General Level of consciousness: awake and alert and oriented Pain management: pain level controlled Vital Signs Assessment: post-procedure vital signs reviewed and stable Respiratory status: spontaneous breathing, nonlabored ventilation and respiratory function stable Cardiovascular status: blood pressure returned to baseline and stable Postop Assessment: no apparent nausea or vomiting Anesthetic complications: no    Last Vitals:  Vitals:   12/27/19 1130 12/27/19 1145  BP: 125/61 117/61  Pulse: 83 76  Resp: (!) 24 19  Temp: (!) 36.1 C   SpO2: (!) 87% 96%    Last Pain:  Vitals:   12/27/19 1130  TempSrc:   PainSc: Asleep                 Travaughn Vue A.

## 2019-12-27 NOTE — Transfer of Care (Signed)
Immediate Anesthesia Transfer of Care Note  Patient: Anita [name]  Procedure(s) Performed: Procedure(s) (LRB): COLD KNIFE CONIZATION CERVIX WITH BIOPSY (N/A) ENDOCERVICAL CURETTAGE (N/A)  Patient Location: PACU  Anesthesia Type: General  Level of Consciousness: awake, sedated, patient cooperative and responds to stimulation  Airway & Oxygen Therapy: Patient Spontanous Breathing and Patient connected to NC02 and soft FM   Post-op Assessment: Report given to PACU RN, Post -op Vital signs reviewed and stable and Patient moving all extremities  Post vital signs: Reviewed and stable  Complications: No apparent anesthesia complications

## 2019-12-27 NOTE — Interval H&P Note (Signed)
History and Physical Interval Note:  12/27/2019 9:42 AM  [name]  has presented today for surgery, with the diagnosis of CERVICAL CANCER.  The various methods of treatment have been discussed with the patient and family. After consideration of risks, benefits and other options for treatment, the patient has consented to  Procedure(s): COLD KNIFE CONIZATION CERVIX WITH BIOPSY (N/A) POSSIBLE ENDOMETRIAL BIOPSY (N/A) POSSIBLE ENDOCERVICAL CURETTAGE (N/A) as a surgical intervention.  The patient's history has been reviewed, patient examined, no change in status, stable for surgery.  I have reviewed the patient's chart and labs.  Questions were answered to the patient's satisfaction.     Anita [name]

## 2019-12-27 NOTE — Anesthesia Preprocedure Evaluation (Signed)
Anesthesia Evaluation  Patient identified by MRN, date of birth, ID band Patient awake    Reviewed: Allergy & Precautions, NPO status , Patient's Chart, lab work & pertinent test results, reviewed documented beta blocker date and time   Airway Mallampati: II  TM Distance: >3 FB Neck ROM: Full    Dental  (+) Poor Dentition   Pulmonary Current Smoker,    Pulmonary exam normal breath sounds clear to auscultation       Cardiovascular hypertension, Pt. on medications + Peripheral Vascular Disease  Normal cardiovascular exam+ Valvular Problems/Murmurs  Rhythm:Regular Rate:Normal  Bilateral carotid stenosis L-40-59% R-1-39%  Echo 12/26/2019 1. Left ventricular ejection fraction, by visual estimation, is 70 to 75%. The left ventricle has hyperdynamic function. There is no left ventricular hypertrophy. There is midcavity obliteration with a narrow LVOT.  2. Left ventricular diastolic parameters are consistent with Grade I diastolic dysfunction (impaired relaxation).  3. The left ventricle has no regional wall motion abnormalities.  4. Global right ventricle has normal systolic function.The right ventricular size is normal. No increase in right ventricular wall thickness.  5. Left atrial size was normal.  6. Right atrial size was normal.  7. Mild to moderate mitral annular calcification.  8. The mitral valve is normal in structure. Trivial mitral valve regurgitation.  9. The tricuspid valve is normal in structure. 10. The aortic valve is tricuspid. Aortic valve regurgitation is not visualized. Mild to moderate aortic valve sclerosis/calcification without any evidence of aortic stenosis. 11. The pulmonic valve was grossly normal. Pulmonic valve regurgitation is trivial.  EKG 11/28/2019 NSR with non specific ST-T abnormalities  Intermittent claudication   Neuro/Psych negative neurological ROS  negative psych ROS   GI/Hepatic Neg liver  ROS, GERD  Medicated and Controlled,  Endo/Other  Hyperlipidemia  Renal/GU Renal InsufficiencyRenal disease  negative genitourinary   Musculoskeletal  (+) Arthritis , Osteoarthritis,  Spondylolisthesis cervicothoracic - chronic back pain   Abdominal   Peds  Hematology  (+) anemia ,   Anesthesia Other Findings   Reproductive/Obstetrics Cervical Ca                             Anesthesia Physical Anesthesia Plan  ASA: III  Anesthesia Plan: General   Post-op Pain Management:    Induction: Intravenous  PONV Risk Score and Plan: 4 or greater and Ondansetron, Dexamethasone and Treatment may vary due to age or medical condition  Airway Management Planned: LMA  Additional Equipment:   Intra-op Plan:   Post-operative Plan: Extubation in OR  Informed Consent: I have reviewed the patients History and Physical, chart, labs and discussed the procedure including the risks, benefits and alternatives for the proposed anesthesia with the patient or authorized representative who has indicated his/her understanding and acceptance.     Dental advisory given  Plan Discussed with: CRNA and Surgeon  Anesthesia Plan Comments:         Anesthesia Quick Evaluation

## 2019-12-28 ENCOUNTER — Other Ambulatory Visit: Payer: Self-pay

## 2019-12-28 ENCOUNTER — Telehealth: Payer: Self-pay

## 2019-12-28 DIAGNOSIS — I739 Peripheral vascular disease, unspecified: Secondary | ICD-10-CM

## 2019-12-28 NOTE — Telephone Encounter (Signed)
Anita [name] is doing well after her surgery. She is eating, drinking, and urinating well. She has moved her bowels. Pain level is currently 2-10. She is only using two extra strength tylenol every 6-8 hrs prn. She continues with some vaginal spotting. Gave her post op appointment on 01-24-20 at 1415. Pt knows to call the [phone] number if she has any questions or concerns.

## 2019-12-28 NOTE — Telephone Encounter (Signed)
LM for patient to call back to the office if she were having any difficulty after her surgery yesterday 12-27-19. Call [phone].

## 2019-12-31 ENCOUNTER — Ambulatory Visit (HOSPITAL_COMMUNITY): Payer: Medicare HMO

## 2020-01-02 ENCOUNTER — Telehealth: Payer: Self-pay | Admitting: *Deleted

## 2020-01-02 LAB — SURGICAL PATHOLOGY

## 2020-01-02 NOTE — Telephone Encounter (Signed)
Patient called for her pathology results. Explained that the results take a week and we will call her with the results

## 2020-01-03 ENCOUNTER — Other Ambulatory Visit (HOSPITAL_COMMUNITY): Payer: Medicare HMO

## 2020-01-06 ENCOUNTER — Encounter: Payer: Self-pay | Admitting: Gynecologic Oncology

## 2020-01-07 ENCOUNTER — Encounter (HOSPITAL_COMMUNITY): Payer: Medicare HMO

## 2020-01-09 ENCOUNTER — Ambulatory Visit: Payer: Medicare HMO | Admitting: Obstetrics and Gynecology

## 2020-01-15 ENCOUNTER — Encounter: Payer: Self-pay | Admitting: General Practice

## 2020-01-23 NOTE — H&P (View-Only) (Signed)
Gynecologic Oncology Return Clinic Visit  01/24/20  Reason for Visit: post-op and discussion about planning for definitive treatment/surgery  Treatment History: 12/15 Pap: [facility] 12/21 EMB: CIN3/CIS 12/30: cervical biopsies (2 and 7 o'c) - [facility]IL, ECC - [facility]IL 12/27/19: CKC - CIN3/CIS involving all quadrants, no invasive cancer, extension into endocervical glands; ECC with fragments of benign stroma. Left vaginal fornix biopsy - [facility]IL.  Interval History: Reports doing well since surgery.  Denies any significant vaginal bleeding, discharge or pelvic pain.  Reports having a good appetite without nausea or vomiting.  Denies any bowel or bladder dysfunction.  Past Medical/Surgical History: Past Medical History:  Diagnosis Date  . Arthritis   . Atherosclerosis of native artery of both lower extremities with intermittent claudication [facility])    vascular--- dr Trula Slade--- last ABIs 07-11-2018 in epic  . Cervical cancer (HCC)    squamous cell carcinoma   . Cervical spondylosis   . Chest pain 12-24-2019  per pt no chest pain since approx. 12-05-2019, pt stated only had chest pain (no nausea, palpitations, irregular heartbeat, sob, difficulty breathing, sweating , or peripheral swelling)   per pt pcp note 11-28-2019, Dr Pricilla Holm, pt complaint with chest pain, Dr Sharlet Salina noted changes on her EKG done that day and with pt at high risk pt referred to cardiology/  pt was seen by Dr Gwenlyn Found 12-18-2019, note in epic, pt denied chest pain at this visit,  Dr Gwenlyn Found ordered carotid dopplers and echo and pt has appointment's scheduled  . Chronic neck and back pain    midline thoracic back pain  . CKD (chronic kidney disease), stage III   . Dry eyes   . Full dentures   . GERD (gastroesophageal reflux disease)    12-24-2019 per pt occasionally ,  drinks water  . Heart murmur    per Dr Gwenlyn Found assessment/ note on 12-18-2019  . History of cervical dysplasia    per pt at age 50s had cervix "scraped"  . History of  trichomonal vaginitis    12-04-2019  per pap smear,  treated  . Hx of adenomatous polyp of colon 03/06/2015  . Hyperlipidemia   . Hypertension    followed by pcp   (12-24-2019 per pt never had a stress test)  . IDA (iron deficiency anemia)   . Postmenopausal bleeding   . Spondylolisthesis of cervicothoracic region    steroid injection 08-23-2019    Past Surgical History:  Procedure Laterality Date  . CATARACT EXTRACTION W/ INTRAOCULAR LENS IMPLANT Right 2018  . CERVICAL CONIZATION W/BX N/A 12/27/2019   Procedure: COLD KNIFE CONIZATION CERVIX WITH BIOPSY;  Surgeon: Lafonda Mosses, MD;  Location: [facility];  Service: Gynecology;  Laterality: N/A;  . COLONOSCOPY  02/2015  . DILATION AND CURETTAGE OF UTERUS N/A 12/27/2019   Procedure: ENDOCERVICAL CURETTAGE;  Surgeon: Lafonda Mosses, MD;  Location: [facility];  Service: Gynecology;  Laterality: N/A;  . HEMORRHOID SURGERY  1970s  . ORIF METATARSAL FRACTURE Left 06-04-2011   dr hewitt  @[facility]    ORIF 3rd and 5th metatorsal shaft fractures/  closed treatment of 1st, 2nd, and 4th metatorsal fractures  . TUBAL LIGATION  yrs ago    Family History  Problem Relation Age of Onset  . Hypertension Mother   . Arthritis Mother   . Colon cancer Neg Hx   . Esophageal cancer Neg Hx   . Rectal cancer Neg Hx   . Stomach cancer Neg Hx   . Ovarian cancer Neg  Hx   . Uterine cancer Neg Hx   . Breast cancer Neg Hx     Social History   Socioeconomic History  . Marital status: Single    Spouse name: Not on file  . Number of children: 2  . Years of education: 12+  . Highest education level: Not on file  Occupational History  . Occupation: Retired  Tobacco Use  . Smoking status: Current Every Day Smoker    Packs/day: 0.50    Years: 59.00    Pack years: 29.50    Types: Cigarettes  . Smokeless tobacco: Never Used  . Tobacco comment: since age 38  Substance and Sexual Activity  . Alcohol use: Not Currently     Alcohol/week: 0.0 standard drinks    Comment: socially  . Drug use: Never  . Sexual activity: Not Currently    Birth control/protection: Post-menopausal, Surgical  Other Topics Concern  . Not on file  Social History Narrative   Lives at home with her mother.   Right-handed.   Several sodas per day.   Social Determinants of Health   Financial Resource Strain:   . Difficulty of Paying Living Expenses: Not on file  Food Insecurity:   . Worried About Charity fundraiser in the Last Year: Not on file  . Ran Out of Food in the Last Year: Not on file  Transportation Needs:   . Lack of Transportation (Medical): Not on file  . Lack of Transportation (Non-Medical): Not on file  Physical Activity:   . Days of Exercise per Week: Not on file  . Minutes of Exercise per Session: Not on file  Stress:   . Feeling of Stress : Not on file  Social Connections:   . Frequency of Communication with Friends and Family: Not on file  . Frequency of Social Gatherings with Friends and Family: Not on file  . Attends Religious Services: Not on file  . Active Member of Clubs or Organizations: Not on file  . Attends Archivist Meetings: Not on file  . Marital Status: Not on file    Current Medications:  Current Outpatient Medications:  .  acetaminophen (TYLENOL) 325 MG tablet, Take 1 tablet (325 mg total) by mouth every 6 (six) hours as needed for up to 15 doses. Take 1-2 tablets every 6 hours for pain, Disp: 15 tablet, Rfl: 0 .  amLODipine (NORVASC) 10 MG tablet, Take 1 tablet (10 mg total) by mouth daily. (Patient taking differently: Take 10 mg by mouth at bedtime. ), Disp: 90 tablet, Rfl: 3 .  aspirin EC 81 MG tablet, Take 81 mg by mouth daily., Disp: , Rfl:  .  atorvastatin (LIPITOR) 20 MG tablet, Take 1 tablet (20 mg total) by mouth daily. (Patient taking differently: Take 20 mg by mouth at bedtime. ), Disp: 90 tablet, Rfl: 3 .  calcium-vitamin D (OSCAL WITH D) 250-125 MG-UNIT tablet, Take  1 tablet by mouth daily., Disp: , Rfl:  .  ferrous sulfate 324 MG TBEC, Take 324 mg by mouth daily with breakfast. , Disp: , Rfl:  .  Polyethyl Glycol-Propyl Glycol (SYSTANE OP), Apply to eye as needed (dry eyes)., Disp: , Rfl:  .  oxyCODONE (OXY IR/ROXICODONE) 5 MG immediate release tablet, Take 1 tablet (5 mg total) by mouth every 4 (four) hours as needed for severe pain. For AFTER surgery, do not take and drive, Disp: 10 tablet, Rfl: 0 .  senna-docusate (SENOKOT-S) 8.6-50 MG tablet, Take 2 tablets by mouth  at bedtime. For AFTER surgery, do not take if having loose stools, Disp: 30 tablet, Rfl: 0  Review of Symptoms: Denies appetite changes, fevers, chills, fatigue, unexplained weight changes. Denies hearing loss, neck lumps or masses, mouth sores, ringing in ears or voice changes. Denies cough or wheezing.  Denies shortness of breath. Denies chest pain or palpitations. Denies leg swelling. Denies abdominal distention, pain, blood in stools, constipation, diarrhea, nausea, vomiting, or early satiety. Denies pain with intercourse, dysuria, frequency, hematuria or incontinence. Denies hot flashes, pelvic pain, vaginal bleeding or vaginal discharge.   Denies joint pain, back pain or muscle pain/cramps. Denies itching, rash, or wounds. Denies dizziness, headaches, numbness or seizures. Denies swollen lymph nodes or glands, denies easy bruising or bleeding. Denies anxiety, depression, confusion, or decreased concentration.  Physical Exam: BP (!) 146/50 (BP Location: Left Arm, Patient Position: Sitting)   Pulse 87   Temp 98 F (36.7 C) (Temporal)   Resp 17   Ht 5' 2.5" (1.588 m)   Wt 130 lb 14.4 oz (59.4 kg)   SpO2 100%   BMI 23.56 kg/m  General: Alert, oriented, no acute distress. HEENT: Atraumatic, normocephalic, sclera anicteric. Chest: Labored breathing on room air. Skin: No rashes or lesions noted. GU: Normal appearing external genitalia without erythema, excoriation, or lesions.   Speculum exam reveals healing cervix after cone, with suture visible still on the cervix as well as from left fornix biopsy.  Manipulation of the cervix causes some bleeding from glandular cells suspected to be endocervical cells.  Silver nitrate used to achieve hemostasis.  No obvious lesions noted.  Bimanual exam reveals cervix without masses, nodularity or firmness.  Small mobile uterus.    Laboratory & Radiologic Studies: A. CERVICAL CONE, EXCISION:  - High-grade squamous intraepithelial lesion (CIN3, carcinoma in situ)  involving all quadrants of the cervix with extension into endocervical  glands. See comment  - No evidence of invasive carcinoma   B. ENDOCERVIX, CURETTAGE:  - Fragments of benign stroma  - No evidence of malignancy   C. VAGINA, LEFT FORNIX, 3:00, BIOPSY:  - Low-grade squamous intraepithelial lesion (CIN1, low grade dysplasia).  See comment   Assessment & Plan: Anita [name] is a 74 y.o. woman with high-grade cervical and vaginal dysplasia, now status post cold knife cone.  The patient is overall healing well from surgery.  As we discussed at her phone visit, given high-grade dysplasia as well as my concern that a repeat cone would be quite difficult as I took as much tissue as I felt comfortable taking, I recommend proceeding with definitive surgery in the form of a hysterectomy.  I reached out to the patient's gynecologist who asked that the patient stay under my care and have her surgery with me.  We had initially talked about postponing her surgery by several months because of decreased operating room availability in the setting of Covid.  We are now starting to reopen time for elective surgery.  The patient would like to move forward with surgery in March.  The patient is an excellent candidate for robotic surgery.  We discussed proceeding with a robotic assisted hysterectomy, bilateral salpingo-oophorectomy, possible laparotomy. The risks of surgery were discussed  in detail and she understands these to include infection; wound separation; hernia; vaginal cuff separation, injury to adjacent organs such as bowel, bladder, blood vessels, ureters and nerves; bleeding which may require blood transfusion; anesthesia risk; thromboembolic events; possible death; unforeseen complications; possible need for re-exploration; medical complications such as heart attack,  stroke, pleural effusion and pneumonia; and, if full lymphadenectomy is performed the risk of lymphedema and lymphocyst. The patient will receive DVT and antibiotic prophylaxis as indicated. She voiced a clear understanding. She had the opportunity to ask questions. Perioperative instructions were reviewed with her. Prescriptions for post-op medications were sent to her pharmacy of choice.  No ibuprofen was prescribed given her chronic kidney disease.    Patient was counseled on the importance of decreasing tobacco use or quitting before surgery as possible.  Patient was recently seen by cardiology and underwent echo on 12/26/2019 showing grade 1 diastolic dysfunction, normal LVEF (70-75%). Will obtain medical clearance from her primary care provider.  22 minutes of total time was spent for this patient encounter, including preparation, face-to-face counseling with the patient and coordination of care, and documentation of the encounter.  Jeral Pinch, MD  Division of Gynecologic Oncology  Department of Obstetrics and Gynecology  [facility]

## 2020-01-23 NOTE — Progress Notes (Signed)
Gynecologic Oncology Return Clinic Visit  01/24/20  Reason for Visit: post-op and discussion about planning for definitive treatment/surgery  Treatment History: 12/15 Pap: [facility] [place] 12/21 EMB: CIN3/CIS 12/30: cervical biopsies (2 and 7 o'c) - LSIL, ECC - HSIL 12/27/19: CKC - CIN3/CIS involving all quadrants, no invasive cancer, extension into endocervical glands; ECC with fragments of benign stroma. Left vaginal fornix biopsy - LSIL.  Interval History: Reports doing well since surgery.  Denies any significant vaginal bleeding, discharge or pelvic pain.  Reports having a good appetite without nausea or vomiting.  Denies any bowel or bladder dysfunction.  Past Medical/Surgical History: Past Medical History:  Diagnosis Date  . Arthritis   . Atherosclerosis of native artery of both lower extremities with intermittent claudication [facility])    vascular--- dr Trula Slade--- last ABIs 07-11-2018 in epic  . Cervical cancer (HCC)    squamous cell carcinoma   . Cervical spondylosis   . Chest pain 12-24-2019  per pt no chest pain since approx. 12-05-2019, pt stated only had chest pain (no nausea, palpitations, irregular heartbeat, sob, difficulty breathing, sweating , or peripheral swelling)   per pt pcp note 11-28-2019, Dr Pricilla Holm, pt complaint with chest pain, Dr Sharlet Salina noted changes on her EKG done that day and with pt at high risk pt referred to cardiology/  pt was seen by Dr Gwenlyn Found 12-18-2019, note in epic, pt denied chest pain at this visit,  Dr Gwenlyn Found ordered carotid dopplers and echo and pt has appointment's scheduled  . Chronic neck and back pain    midline thoracic back pain  . CKD (chronic kidney disease), stage III   . Dry eyes   . Full dentures   . GERD (gastroesophageal reflux disease)    12-24-2019 per pt occasionally ,  drinks water  . Heart murmur    per Dr Gwenlyn Found assessment/ note on 12-18-2019  . History of cervical dysplasia    per pt at age 27s had cervix "scraped"  . History of  trichomonal vaginitis    12-04-2019  per pap smear,  treated  . Hx of adenomatous polyp of colon 03/06/2015  . Hyperlipidemia   . Hypertension    followed by pcp   (12-24-2019 per pt never had a stress test)  . IDA (iron deficiency anemia)   . Postmenopausal bleeding   . Spondylolisthesis of cervicothoracic region    steroid injection 08-23-2019    Past Surgical History:  Procedure Laterality Date  . CATARACT EXTRACTION W/ INTRAOCULAR LENS IMPLANT Right 2018  . CERVICAL CONIZATION W/BX N/A 12/27/2019   Procedure: COLD KNIFE CONIZATION CERVIX WITH BIOPSY;  Surgeon: Lafonda Mosses, MD;  Location[facility];  Service: Gynecology;  Laterality: N/A;  . COLONOSCOPY  02/2015  . DILATION AND CURETTAGE OF UTERUS N/A 12/27/2019   Procedure: ENDOCERVICAL CURETTAGE;  Surgeon: Lafonda Mosses, MD;  Location: [facility];  Service: Gynecology;  Laterality: N/A;  . HEMORRHOID SURGERY  1970s  . ORIF METATARSAL FRACTURE Left 06-04-2011   dr hewitt  @[facility]    ORIF 3rd and 5th metatorsal shaft fractures/  closed treatment of 1st, 2nd, and 4th metatorsal fractures  . TUBAL LIGATION  yrs ago    Family History  Problem Relation Age of Onset  . Hypertension Mother   . Arthritis Mother   . Colon cancer Neg Hx   . Esophageal cancer Neg Hx   . Rectal cancer Neg Hx   . Stomach cancer Neg Hx   . Ovarian cancer Neg  Hx   . Uterine cancer Neg Hx   . Breast cancer Neg Hx     Social History   Socioeconomic History  . Marital status: Single    Spouse name: Not on file  . Number of children: 2  . Years of education: 12+  . Highest education level: Not on file  Occupational History  . Occupation: Retired  Tobacco Use  . Smoking status: Current Every Day Smoker    Packs/day: 0.50    Years: 59.00    Pack years: 29.50    Types: Cigarettes  . Smokeless tobacco: Never Used  . Tobacco comment: since age 69  Substance and Sexual Activity  . Alcohol use: Not Currently     Alcohol/week: 0.0 standard drinks    Comment: socially  . Drug use: Never  . Sexual activity: Not Currently    Birth control/protection: Post-menopausal, Surgical  Other Topics Concern  . Not on file  Social History Narrative   Lives at home with her mother.   Right-handed.   Several sodas per day.   Social Determinants of Health   Financial Resource Strain:   . Difficulty of Paying Living Expenses: Not on file  Food Insecurity:   . Worried About Charity fundraiser in the Last Year: Not on file  . Ran Out of Food in the Last Year: Not on file  Transportation Needs:   . Lack of Transportation (Medical): Not on file  . Lack of Transportation (Non-Medical): Not on file  Physical Activity:   . Days of Exercise per Week: Not on file  . Minutes of Exercise per Session: Not on file  Stress:   . Feeling of Stress : Not on file  Social Connections:   . Frequency of Communication with Friends and Family: Not on file  . Frequency of Social Gatherings with Friends and Family: Not on file  . Attends Religious Services: Not on file  . Active Member of Clubs or Organizations: Not on file  . Attends Archivist Meetings: Not on file  . Marital Status: Not on file    Current Medications:  Current Outpatient Medications:  .  acetaminophen (TYLENOL) 325 MG tablet, Take 1 tablet (325 mg total) by mouth every 6 (six) hours as needed for up to 15 doses. Take 1-2 tablets every 6 hours for pain, Disp: 15 tablet, Rfl: 0 .  amLODipine (NORVASC) 10 MG tablet, Take 1 tablet (10 mg total) by mouth daily. (Patient taking differently: Take 10 mg by mouth at bedtime. ), Disp: 90 tablet, Rfl: 3 .  aspirin EC 81 MG tablet, Take 81 mg by mouth daily., Disp: , Rfl:  .  atorvastatin (LIPITOR) 20 MG tablet, Take 1 tablet (20 mg total) by mouth daily. (Patient taking differently: Take 20 mg by mouth at bedtime. ), Disp: 90 tablet, Rfl: 3 .  calcium-vitamin D (OSCAL WITH D) 250-125 MG-UNIT tablet, Take  1 tablet by mouth daily., Disp: , Rfl:  .  ferrous sulfate 324 MG TBEC, Take 324 mg by mouth daily with breakfast. , Disp: , Rfl:  .  Polyethyl Glycol-Propyl Glycol (SYSTANE OP), Apply to eye as needed (dry eyes)., Disp: , Rfl:  .  oxyCODONE (OXY IR/ROXICODONE) 5 MG immediate release tablet, Take 1 tablet (5 mg total) by mouth every 4 (four) hours as needed for severe pain. For AFTER surgery, do not take and drive, Disp: 10 tablet, Rfl: 0 .  senna-docusate (SENOKOT-S) 8.6-50 MG tablet, Take 2 tablets by mouth  at bedtime. For AFTER surgery, do not take if having loose stools, Disp: 30 tablet, Rfl: 0  Review of Symptoms: Denies appetite changes, fevers, chills, fatigue, unexplained weight changes. Denies hearing loss, neck lumps or masses, mouth sores, ringing in ears or voice changes. Denies cough or wheezing.  Denies shortness of breath. Denies chest pain or palpitations. Denies leg swelling. Denies abdominal distention, pain, blood in stools, constipation, diarrhea, nausea, vomiting, or early satiety. Denies pain with intercourse, dysuria, frequency, hematuria or incontinence. Denies hot flashes, pelvic pain, vaginal bleeding or vaginal discharge.   Denies joint pain, back pain or muscle pain/cramps. Denies itching, rash, or wounds. Denies dizziness, headaches, numbness or seizures. Denies swollen lymph nodes or glands, denies easy bruising or bleeding. Denies anxiety, depression, confusion, or decreased concentration.  Physical Exam: BP (!) 146/50 (BP Location: Left Arm, Patient Position: Sitting)   Pulse 87   Temp 98 F (36.7 C) (Temporal)   Resp 17   Ht 5' 2.5" (1.588 m)   Wt 130 lb 14.4 oz (59.4 kg)   SpO2 100%   BMI 23.56 kg/m  General: Alert, oriented, no acute distress. HEENT: Atraumatic, normocephalic, sclera anicteric. Chest: Labored breathing on room air. Skin: No rashes or lesions noted. GU: Normal appearing external genitalia without erythema, excoriation, or lesions.   Speculum exam reveals healing cervix after cone, with suture visible still on the cervix as well as from left fornix biopsy.  Manipulation of the cervix causes some bleeding from glandular cells suspected to be endocervical cells.  Silver nitrate used to achieve hemostasis.  No obvious lesions noted.  Bimanual exam reveals cervix without masses, nodularity or firmness.  Small mobile uterus.    Laboratory & Radiologic Studies: A. CERVICAL CONE, EXCISION:  - High-grade squamous intraepithelial lesion (CIN3, carcinoma in situ)  involving all quadrants of the cervix with extension into endocervical  glands. See comment  - No evidence of invasive carcinoma   B. ENDOCERVIX, CURETTAGE:  - Fragments of benign stroma  - No evidence of malignancy   C. VAGINA, LEFT FORNIX, 3:00, BIOPSY:  - Low-grade squamous intraepithelial lesion (CIN1, low grade dysplasia).  See comment   Assessment & Plan: Anita [name] is a 74 y.o. woman with high-grade cervical and vaginal dysplasia, now status post cold knife cone.  The patient is overall healing well from surgery.  As we discussed at her phone visit, given high-grade dysplasia as well as my concern that a repeat cone would be quite difficult as I took as much tissue as I felt comfortable taking, I recommend proceeding with definitive surgery in the form of a hysterectomy.  I reached out to the patient's gynecologist who asked that the patient stay under my care and have her surgery with me.  We had initially talked about postponing her surgery by several months because of decreased operating room availability in the setting of Covid.  We are now starting to reopen time for elective surgery.  The patient would like to move forward with surgery in March.  The patient is an excellent candidate for robotic surgery.  We discussed proceeding with a robotic assisted hysterectomy, bilateral salpingo-oophorectomy, possible laparotomy. The risks of surgery were discussed  in detail and she understands these to include infection; wound separation; hernia; vaginal cuff separation, injury to adjacent organs such as bowel, bladder, blood vessels, ureters and nerves; bleeding which may require blood transfusion; anesthesia risk; thromboembolic events; possible death; unforeseen complications; possible need for re-exploration; medical complications such as heart attack,  stroke, pleural effusion and pneumonia; and, if full lymphadenectomy is performed the risk of lymphedema and lymphocyst. The patient will receive DVT and antibiotic prophylaxis as indicated. She voiced a clear understanding. She had the opportunity to ask questions. Perioperative instructions were reviewed with her. Prescriptions for post-op medications were sent to her pharmacy of choice.  No ibuprofen was prescribed given her chronic kidney disease.    Patient was counseled on the importance of decreasing tobacco use or quitting before surgery as possible.  Patient was recently seen by cardiology and underwent echo on 12/26/2019 showing grade 1 diastolic dysfunction, normal LVEF (70-75%). Will obtain medical clearance from her primary care Janalynn Eder.  22 minutes of total time was spent for this patient encounter, including preparation, face-to-face counseling with the patient and coordination of care, and documentation of the encounter.  Jeral Pinch, MD  Division of Gynecologic Oncology  Department of Obstetrics and Gynecology  [facility]

## 2020-01-24 ENCOUNTER — Inpatient Hospital Stay: Payer: Medicare HMO | Attending: Gynecologic Oncology | Admitting: Gynecologic Oncology

## 2020-01-24 ENCOUNTER — Other Ambulatory Visit: Payer: Self-pay

## 2020-01-24 ENCOUNTER — Encounter: Payer: Self-pay | Admitting: Gynecologic Oncology

## 2020-01-24 VITALS — BP 146/50 | HR 87 | Temp 98.0°F | Resp 17 | Ht 62.5 in | Wt 130.9 lb

## 2020-01-24 DIAGNOSIS — N893 Dysplasia of vagina, unspecified: Secondary | ICD-10-CM | POA: Diagnosis not present

## 2020-01-24 DIAGNOSIS — Z79899 Other long term (current) drug therapy: Secondary | ICD-10-CM | POA: Diagnosis not present

## 2020-01-24 DIAGNOSIS — R6889 Other general symptoms and signs: Secondary | ICD-10-CM | POA: Diagnosis not present

## 2020-01-24 DIAGNOSIS — K219 Gastro-esophageal reflux disease without esophagitis: Secondary | ICD-10-CM | POA: Diagnosis not present

## 2020-01-24 DIAGNOSIS — N879 Dysplasia of cervix uteri, unspecified: Secondary | ICD-10-CM | POA: Diagnosis not present

## 2020-01-24 DIAGNOSIS — Z9889 Other specified postprocedural states: Secondary | ICD-10-CM

## 2020-01-24 DIAGNOSIS — N183 Chronic kidney disease, stage 3 unspecified: Secondary | ICD-10-CM | POA: Insufficient documentation

## 2020-01-24 DIAGNOSIS — R011 Cardiac murmur, unspecified: Secondary | ICD-10-CM | POA: Diagnosis not present

## 2020-01-24 DIAGNOSIS — Z7982 Long term (current) use of aspirin: Secondary | ICD-10-CM | POA: Diagnosis not present

## 2020-01-24 DIAGNOSIS — M1990 Unspecified osteoarthritis, unspecified site: Secondary | ICD-10-CM | POA: Insufficient documentation

## 2020-01-24 DIAGNOSIS — E785 Hyperlipidemia, unspecified: Secondary | ICD-10-CM | POA: Diagnosis not present

## 2020-01-24 DIAGNOSIS — I129 Hypertensive chronic kidney disease with stage 1 through stage 4 chronic kidney disease, or unspecified chronic kidney disease: Secondary | ICD-10-CM | POA: Diagnosis not present

## 2020-01-24 DIAGNOSIS — F17210 Nicotine dependence, cigarettes, uncomplicated: Secondary | ICD-10-CM | POA: Diagnosis not present

## 2020-01-24 MED ORDER — SENNOSIDES-DOCUSATE SODIUM 8.6-50 MG PO TABS: 2.0000 | ORAL_TABLET | Freq: Every day | ORAL | 0 refills | Status: DC

## 2020-01-24 MED ORDER — OXYCODONE HCL 5 MG PO TABS: 5.0000 mg | ORAL_TABLET | ORAL | 0 refills | Status: DC | PRN

## 2020-01-24 NOTE — Patient Instructions (Signed)
Preparing for your Surgery  Plan for surgery on February 19, 2020 with Dr. Jeral Pinch at [place] will be scheduled for a robotic assisted laparoscopic hysterectomy, bilateral salpingo-oophorectomy  Pre-operative Testing -You will receive a phone call from presurgical testing at [facility] to discuss pre-operative instructions and to arrange for the COVID test prior to surgery.  -Bring your insurance card, copy of an advanced directive if applicable, medication list  -At that visit, you will be asked to sign a consent for a possible blood transfusion in case a transfusion becomes necessary during surgery.  The need for a blood transfusion is rare but having consent is a necessary part of your care.    -You can continue taking your aspirin up until the day before surgery.  -Do not take supplements such as fish oil (omega 3), red yeast rice, tumeric before your surgery.   Day Before Surgery at [place] will be asked to take in a light diet the day before surgery.  Avoid carbonated beverages.  You will be advised to have nothing to eat or drink after midnight the evening before.    Eat a light diet the day before surgery.  Examples including soups, broths, toast, yogurt, mashed potatoes.  Things to avoid include carbonated beverages (fizzy beverages), raw fruits and raw vegetables, or beans.   If your bowels are filled with gas, your surgeon will have difficulty visualizing your pelvic organs which increases your surgical risks.  Your role in recovery Your role is to become active as soon as directed by your doctor, while still giving yourself time to heal.  Rest when you feel tired. You will be asked to do the following in order to speed your recovery:  - Cough and breathe deeply. This helps to clear and expand your lungs and can prevent pneumonia after surgery.  - [place]. Do mild physical activity. Walking or moving your legs help your  circulation and body functions return to normal. Do not try to get up or walk alone the first time after surgery.   -If you develop swelling on one leg or the other, pain in the back of your leg, redness/warmth in one of your legs, please call the office or go to the Emergency Room to have a doppler to rule out a blood clot. For shortness of breath, chest pain-seek care in the Emergency Room as soon as possible. - Actively manage your pain. Managing your pain lets you move in comfort. We will ask you to rate your pain on a scale of zero to 10. It is your responsibility to tell your doctor or nurse where and how much you hurt so your pain can be treated.  Special Considerations -If you are diabetic, you may be placed on insulin after surgery to have closer control over your blood sugars to promote healing and recovery.  This does not mean that you will be discharged on insulin.  If applicable, your oral antidiabetics will be resumed when you are tolerating a solid diet.  -Your final pathology results from surgery should be available around one week after surgery and the results will be relayed to you when available.  -Dr. Lahoma Crocker is the surgeon that assists your GYN Oncologist with surgery.  If you end up staying the night, the next day after your surgery you will either see Dr. Denman George, Dr. Berline Lopes, or Dr. Lahoma Crocker.  -FMLA forms can be faxed to [phone] and  please allow 5-7 business days for completion.  Pain Management After Surgery -You have been prescribed your pain medication and bowel regimen medications before surgery so that you can have these available when you are discharged from the hospital. The pain medication is for use ONLY AFTER surgery and a new prescription will not be given.   -Make sure that you have Tylenol and Ibuprofen at home to use on a regular basis after surgery for pain control. We recommend alternating the medications every hour to six hours since  they work differently and are processed in the body differently for pain relief.  -Review the attached handout on narcotic use and their risks and side effects.   Bowel Regimen -You have been prescribed Sennakot-S to take nightly to prevent constipation especially if you are taking the narcotic pain medication intermittently.  It is important to prevent constipation and drink adequate amounts of liquids. You can stop taking this medication when you are not taking pain medication and you are back on your normal bowel routine.   Blood Transfusion Information (For the consent to be signed before surgery)  We will be checking your blood type before surgery so in case of emergencies, we will know what type of blood you would need.                                            WHAT IS A BLOOD TRANSFUSION?  A transfusion is the replacement of blood or some of its parts. Blood is made up of multiple cells which provide different functions.  Red blood cells carry oxygen and are used for blood loss replacement.  White blood cells fight against infection.  Platelets control bleeding.  Plasma helps clot blood.  Other blood products are available for specialized needs, such as hemophilia or other clotting disorders. BEFORE THE TRANSFUSION  Who gives blood for transfusions?   You may be able to donate blood to be used at a later date on yourself (autologous donation).  Relatives can be asked to donate blood. This is generally not any safer than if you have received blood from a stranger. The same precautions are taken to ensure safety when a relative's blood is donated.  Healthy volunteers who are fully evaluated to make sure their blood is safe. This is blood bank blood. Transfusion therapy is the safest it has ever been in the practice of medicine. Before blood is taken from a donor, a complete history is taken to make sure that person has no history of diseases nor engages in risky social behavior  (examples are intravenous drug use or sexual activity with multiple partners). The donor's travel history is screened to minimize risk of transmitting infections, such as malaria. The donated blood is tested for signs of infectious diseases, such as HIV and hepatitis. The blood is then tested to be sure it is compatible with you in order to minimize the chance of a transfusion reaction. If you or a relative donates blood, this is often done in anticipation of surgery and is not appropriate for emergency situations. It takes many days to process the donated blood. RISKS AND COMPLICATIONS Although transfusion therapy is very safe and saves many lives, the main dangers of transfusion include:   Getting an infectious disease.  Developing a transfusion reaction. This is an allergic reaction to something in the blood you were given. Every  precaution is taken to prevent this. The decision to have a blood transfusion has been considered carefully by your caregiver before blood is given. Blood is not given unless the benefits outweigh the risks.  AFTER SURGERY INSTRUCTIONS  Return to work: 4-6 weeks if applicable  Activity: 1. Be up and out of the bed during the day.  Take a nap if needed.  You may walk up steps but be careful and use the hand rail.  Stair climbing will tire you more than you think, you may need to stop part way and rest.   2. No lifting or straining for 6 weeks over 10 pounds. No pushing, pulling, straining for 6 weeks.  3. No driving for 1 week(s).  Do not drive if you are taking narcotic pain medicine.   4. You can shower as soon as the next day after surgery. Shower daily.  Use soap and water on your incision and pat dry; don't rub.  No tub baths or submerging your body in water until cleared by your surgeon. If you have the soap that was given to you by pre-surgical testing that was used before surgery, you do not need to use it afterwards because this can irritate your incisions.    5. No sexual activity and nothing in the vagina for 8 weeks.  6. You may experience a small amount of clear drainage from your incisions, which is normal.  If the drainage persists, increases, or changes color please call the office.  7. Do not use creams, lotions, or ointments such as neosporin on your incisions after surgery until advised by your surgeon because they can cause removal of the dermabond glue on your incisions.    8. You may experience vaginal spotting after surgery or around the 6-8 week mark from surgery when the stitches at the top of the vagina begin to dissolve.  The spotting is normal but if you experience heavy bleeding, call our office.  9. Take Tylenol first for pain and only use narcotic pain medication for severe pain not relieved by the Tylenol.  Monitor your Tylenol intake to a max of 4,000 mg.  Diet: 1. Low sodium Heart Healthy Diet is recommended.  2. It is safe to use a laxative, such as Miralax or Colace, if you have difficulty moving your bowels. You can take Sennakot at bedtime every evening to keep bowel movements regular and to prevent constipation.    Wound Care: 1. Keep clean and dry.  Shower daily.  Reasons to call the Doctor:  Fever - Oral temperature greater than 100.4 degrees Fahrenheit  Foul-smelling vaginal discharge  Difficulty urinating  Nausea and vomiting  Increased pain at the site of the incision that is unrelieved with pain medicine.  Difficulty breathing with or without chest pain  New calf pain especially if only on one side  Sudden, continuing increased vaginal bleeding with or without clots.   Contacts: For questions or concerns you should contact:  Dr. Jeral Pinch at [phone]  Joylene John, NP at [phone]  After Hours: call [phone] and have the GYN Oncologist paged/contacted

## 2020-01-25 ENCOUNTER — Other Ambulatory Visit: Payer: Self-pay | Admitting: Gynecologic Oncology

## 2020-01-25 DIAGNOSIS — N879 Dysplasia of cervix uteri, unspecified: Secondary | ICD-10-CM

## 2020-01-28 ENCOUNTER — Telehealth: Payer: Self-pay | Admitting: *Deleted

## 2020-01-28 NOTE — Telephone Encounter (Signed)
Faxed medical clearance form and Dr Charisse March office note to her PCP

## 2020-01-31 ENCOUNTER — Telehealth: Payer: Self-pay | Admitting: *Deleted

## 2020-01-31 ENCOUNTER — Telehealth: Payer: Self-pay

## 2020-01-31 NOTE — Telephone Encounter (Signed)
   Primary Cardiologist: Quay Burow, MD  Chart reviewed as part of pre-operative protocol coverage. Given past medical history and time since last visit, based on ACC/AHA guidelines, Anita [name] would be at acceptable risk for the planned procedure without further cardiovascular testing.   Her RCRI is class II risk, 0.9% risk of major cardiac event.  I will route this recommendation to the requesting party via Epic fax function and remove from pre-op pool.  Please call with questions.   Jossie Ng. [facility] [address] Office [phone] Fax [phone]

## 2020-01-31 NOTE — Telephone Encounter (Signed)
Received fax from patient's PCP that she is cleared from a medical standpoint but he recommends cardiology clearance.   Cardiology clearance form sent to he Cardiologist

## 2020-01-31 NOTE — Telephone Encounter (Signed)
   [facility] Pre-operative Risk Assessment    Request for surgical clearance:  1. What type of surgery is being performed? XI ROBOTIC ASSISTED TOTAL HYSTERECTOMY WITH BILATERAL SALPINGO    2. When is this surgery scheduled? 02-19-2020   3. What type of clearance is required (medical clearance vs. Pharmacy clearance to hold med vs. Both)? MEDICAL  4. Are there any medications that need to be held prior to surgery and how long? NONE   5. Practice name and name of physician performing surgery? [facility]  Lafonda Mosses, MD  6. What is your office phone number [phone]    7.   What is your office fax number [phone]  8.   Anesthesia type (None, local, MAC, general) ? GENERAL    _________________________________________________________________   (provider comments below)

## 2020-02-10 ENCOUNTER — Ambulatory Visit: Payer: Medicare HMO | Attending: Internal Medicine

## 2020-02-10 DIAGNOSIS — Z23 Encounter for immunization: Secondary | ICD-10-CM | POA: Insufficient documentation

## 2020-02-10 NOTE — Progress Notes (Signed)
   Covid-19 Vaccination Clinic  Name:  DEZZIE AGRAMONTE    MRN: [id number] DOB: [birth year]  02/10/2020  Ms. Reith was observed post Covid-19 immunization for 15 minutes without incidence. She was provided with Vaccine Information Sheet and instruction to access the V-Safe system.   Ms. Arbon was instructed to call 911 with any severe reactions post vaccine: Marland Kitchen Difficulty breathing  . Swelling of your face and throat  . A fast heartbeat  . A bad rash all over your body  . Dizziness and weakness    Immunizations Administered    Name Date Dose VIS Date Route   Pfizer COVID-19 Vaccine 02/10/2020  3:38 PM 0.3 mL 11/30/2019 Intramuscular   Manufacturer: [place]   Lot: [id number]   [place]: [id number]

## 2020-02-12 NOTE — Patient Instructions (Signed)
DUE TO COVID-19 ONLY ONE VISITOR IS ALLOWED TO COME WITH YOU AND STAY IN THE WAITING ROOM ONLY DURING PRE OP AND PROCEDURE DAY OF SURGERY. THE 1 VISITOR MAY VISIT WITH YOU AFTER SURGERY IN YOUR PRIVATE ROOM DURING VISITING HOURS ONLY!  YOU NEED TO HAVE A COVID 19 TEST ON: 02/15/20 @ 10:35 am, THIS TEST MUST BE DONE BEFORE SURGERY, COME  [place], [address] , [id number].  ([facility]) ONCE YOUR COVID TEST IS COMPLETED, PLEASE BEGIN THE QUARANTINE INSTRUCTIONS AS OUTLINED IN YOUR HANDOUT.                KERITH VENARD     Your procedure is scheduled on: 02/19/20   Report to [facility] Main  Entrance   Report to admitting at: 9:30 AM     Call this number if you have problems the morning of surgery [phone]    Remember   [place], NO [place].     Take these medicines the morning of surgery with A SIP OF WATER: amlodipine                You may not have any metal on your body including hair pins and              piercings  Do not wear jewelry, make-up, lotions, powders or perfumes, deodorant             Do not wear nail polish on your fingernails.  Do not shave  48 hours prior to surgery.                Do not bring valuables to the hospital. [place].  Contacts, dentures or bridgework may not be worn into surgery.  Leave suitcase in the car. After surgery it may be brought to your room.     Patients discharged the day of surgery will not be allowed to drive home. IF YOU ARE HAVING SURGERY AND GOING HOME THE SAME DAY, YOU MUST HAVE AN ADULT TO DRIVE YOU HOME AND BE WITH YOU FOR 24 HOURS. YOU MAY GO HOME BY TAXI OR UBER OR ORTHERWISE, BUT AN ADULT MUST ACCOMPANY YOU HOME AND STAY WITH YOU FOR 24 HOURS.  Name and phone number of your driver:  Special Instructions: N/A              Please read over the following fact sheets you  were given: _____________________________________________________________________             NO SOLID FOOD AFTER MIDNIGHT THE NIGHT PRIOR TO SURGERY. NOTHING BY MOUTH EXCEPT CLEAR LIQUIDS UNTIL: 8:30 am.    CLEAR LIQUID DIET   Foods Allowed                                                                     Foods Excluded  Coffee and tea, regular and decaf  liquids that you cannot  Plain Jell-O any favor except red or purple                                           see through such as: Fruit ices (not with fruit pulp)                                     milk, soups, orange juice  Iced Popsicles                                    All solid food Carbonated beverages, regular and diet                                    Cranberry, grape and apple juices Sports drinks like Gatorade Lightly seasoned clear broth or consume(fat free) Sugar, honey syrup  Sample Menu Breakfast                                Lunch                                     Supper Cranberry juice                    Beef broth                            Chicken broth Jell-O                                     Grape juice                           Apple juice Coffee or tea                        Jell-O                                      Popsicle                                                Coffee or tea                        Coffee or tea  _____________________________________________________________________  [facility] - Preparing for Surgery Before surgery, you can play an important role.  Because skin is not sterile, your skin needs to be as free of germs as possible.  You can reduce the number of germs on your skin by washing with CHG (chlorahexidine gluconate) soap before surgery.  CHG is an antiseptic cleaner which kills  germs and bonds with the skin to continue killing germs even after washing. Please DO NOT use if you have an allergy to CHG or antibacterial soaps.  If your  skin becomes reddened/irritated stop using the CHG and inform your nurse when you arrive at Short Stay. Do not shave (including legs and underarms) for at least 48 hours prior to the first CHG shower.  You may shave your face/neck. Please follow these instructions carefully:  1.  Shower with CHG Soap the night before surgery and the  morning of Surgery.  2.  If you choose to wash your hair, wash your hair first as usual with your  normal  shampoo.  3.  After you shampoo, rinse your hair and body thoroughly to remove the  shampoo.                           4.  Use CHG as you would any other liquid soap.  You can apply chg directly  to the skin and wash                       Gently with a scrungie or clean washcloth.  5.  Apply the CHG Soap to your body ONLY FROM THE NECK DOWN.   Do not use on face/ open                           Wound or open sores. Avoid contact with eyes, ears mouth and genitals (private parts).                       Wash face,  Genitals (private parts) with your normal soap.             6.  Wash thoroughly, paying special attention to the area where your surgery  will be performed.  7.  Thoroughly rinse your body with warm water from the neck down.  8.  DO NOT shower/wash with your normal soap after using and rinsing off  the CHG Soap.                9.  Pat yourself dry with a clean towel.            10.  Wear clean pajamas.            11.  Place clean sheets on your bed the night of your first shower and do not  sleep with pets. Day of Surgery : Do not apply any lotions/deodorants the morning of surgery.  Please wear clean clothes to the hospital/surgery center.  FAILURE TO FOLLOW THESE INSTRUCTIONS MAY RESULT IN THE CANCELLATION OF YOUR SURGERY PATIENT SIGNATURE_________________________________  NURSE SIGNATURE__________________________________  ________________________________________________________________________   Adam Phenix  An incentive spirometer  is a tool that can help keep your lungs clear and active. This tool measures how well you are filling your lungs with each breath. Taking long deep breaths may help reverse or decrease the chance of developing breathing (pulmonary) problems (especially infection) following:  A long period of time when you are unable to move or be active. BEFORE THE PROCEDURE   If the spirometer includes an indicator to show your best effort, your nurse or respiratory therapist will set it to a desired goal.  If possible, sit up straight or lean slightly forward. Try not to slouch.  Hold the incentive spirometer in an  upright position. INSTRUCTIONS FOR USE  1. Sit on the edge of your bed if possible, or sit up as far as you can in bed or on a chair. 2. Hold the incentive spirometer in an upright position. 3. Breathe out normally. 4. Place the mouthpiece in your mouth and seal your lips tightly around it. 5. Breathe in slowly and as deeply as possible, raising the piston or the ball toward the top of the column. 6. Hold your breath for 3-5 seconds or for as long as possible. Allow the piston or ball to fall to the bottom of the column. 7. Remove the mouthpiece from your mouth and breathe out normally. 8. Rest for a few seconds and repeat Steps 1 through 7 at least 10 times every 1-2 hours when you are awake. Take your time and take a few normal breaths between deep breaths. 9. The spirometer may include an indicator to show your best effort. Use the indicator as a goal to work toward during each repetition. 10. After each set of 10 deep breaths, practice coughing to be sure your lungs are clear. If you have an incision (the cut made at the time of surgery), support your incision when coughing by placing a pillow or rolled up towels firmly against it. Once you are able to get out of bed, walk around indoors and cough well. You may stop using the incentive spirometer when instructed by your caregiver.  RISKS AND  COMPLICATIONS  Take your time so you do not get dizzy or light-headed.  If you are in pain, you may need to take or ask for pain medication before doing incentive spirometry. It is harder to take a deep breath if you are having pain. AFTER USE  Rest and breathe slowly and easily.  It can be helpful to keep track of a log of your progress. Your caregiver can provide you with a simple table to help with this. If you are using the spirometer at home, follow these instructions: [place] IF:   You are having difficultly using the spirometer.  You have trouble using the spirometer as often as instructed.  Your pain medication is not giving enough relief while using the spirometer.  You develop fever of 100.5 F (38.1 C) or higher. SEEK IMMEDIATE MEDICAL CARE IF:   You cough up bloody sputum that had not been present before.  You develop fever of 102 F (38.9 C) or greater.  You develop worsening pain at or near the incision site. MAKE SURE YOU:   Understand these instructions.  Will watch your condition.  Will get help right away if you are not doing well or get worse. Document Released: 04/18/2007 Document Revised: 02/28/2012 Document Reviewed: 06/19/2007 [facility] Patient Information 2014 [place], [place].   ________________________________________________________________________

## 2020-02-13 ENCOUNTER — Encounter (HOSPITAL_COMMUNITY): Payer: Self-pay

## 2020-02-13 ENCOUNTER — Other Ambulatory Visit: Payer: Self-pay

## 2020-02-13 ENCOUNTER — Encounter (HOSPITAL_COMMUNITY)
Admission: RE | Admit: 2020-02-13 | Discharge: 2020-02-13 | Disposition: A | Discharge status: Home / Self Care | Payer: Medicare HMO | Source: Ambulatory Visit | Attending: Gynecologic Oncology | Admitting: Gynecologic Oncology

## 2020-02-13 DIAGNOSIS — Z9841 Cataract extraction status, right eye: Secondary | ICD-10-CM | POA: Insufficient documentation

## 2020-02-13 DIAGNOSIS — K219 Gastro-esophageal reflux disease without esophagitis: Secondary | ICD-10-CM | POA: Diagnosis not present

## 2020-02-13 DIAGNOSIS — Z7982 Long term (current) use of aspirin: Secondary | ICD-10-CM | POA: Diagnosis not present

## 2020-02-13 DIAGNOSIS — Z961 Presence of intraocular lens: Secondary | ICD-10-CM | POA: Diagnosis not present

## 2020-02-13 DIAGNOSIS — N879 Dysplasia of cervix uteri, unspecified: Secondary | ICD-10-CM | POA: Diagnosis not present

## 2020-02-13 DIAGNOSIS — M1990 Unspecified osteoarthritis, unspecified site: Secondary | ICD-10-CM | POA: Insufficient documentation

## 2020-02-13 DIAGNOSIS — E785 Hyperlipidemia, unspecified: Secondary | ICD-10-CM | POA: Diagnosis not present

## 2020-02-13 DIAGNOSIS — F17200 Nicotine dependence, unspecified, uncomplicated: Secondary | ICD-10-CM | POA: Insufficient documentation

## 2020-02-13 DIAGNOSIS — N183 Chronic kidney disease, stage 3 unspecified: Secondary | ICD-10-CM | POA: Diagnosis not present

## 2020-02-13 DIAGNOSIS — Z01812 Encounter for preprocedural laboratory examination: Secondary | ICD-10-CM | POA: Diagnosis not present

## 2020-02-13 DIAGNOSIS — D509 Iron deficiency anemia, unspecified: Secondary | ICD-10-CM | POA: Diagnosis not present

## 2020-02-13 DIAGNOSIS — Z79899 Other long term (current) drug therapy: Secondary | ICD-10-CM | POA: Diagnosis not present

## 2020-02-13 DIAGNOSIS — Z01818 Encounter for other preprocedural examination: Secondary | ICD-10-CM | POA: Diagnosis not present

## 2020-02-13 DIAGNOSIS — I129 Hypertensive chronic kidney disease with stage 1 through stage 4 chronic kidney disease, or unspecified chronic kidney disease: Secondary | ICD-10-CM | POA: Diagnosis not present

## 2020-02-13 NOTE — Progress Notes (Signed)
PCP -  Cardiologist -  J. Clever: NP.:clearance: 01/31/20. EPIC  Chest x-ray -  EKG - 11/28/19 Stress Test -  ECHO - 12/26/19 Cardiac Cath -   Sleep Study -  CPAP -   Fasting Blood Sugar -  Checks Blood Sugar _____ times a day  Blood Thinner Instructions: Aspirin Instructions: Tp stop on 02/18/20 as per MD. Last Dose:  Anesthesia review: Pt. Was informed by RN,that she should stop smoking at least 24 hours before surgery.Pt. verbalized her understanding of this subject.  Patient denies shortness of breath, fever, cough and chest pain at PAT appointment   Patient verbalized understanding of instructions that were given to them at the PAT appointment. Patient was also instructed that they will need to review over the PAT instructions again at home before surgery.

## 2020-02-14 ENCOUNTER — Other Ambulatory Visit (HOSPITAL_COMMUNITY): Payer: Medicare HMO

## 2020-02-15 ENCOUNTER — Other Ambulatory Visit (HOSPITAL_COMMUNITY)
Admission: RE | Admit: 2020-02-15 | Discharge: 2020-02-15 | Disposition: A | Discharge status: Home / Self Care | Payer: Medicare HMO | Source: Ambulatory Visit | Attending: Gynecologic Oncology | Admitting: Gynecologic Oncology

## 2020-02-15 ENCOUNTER — Encounter (HOSPITAL_COMMUNITY)
Admission: RE | Admit: 2020-02-15 | Discharge: 2020-02-15 | Disposition: A | Discharge status: Home / Self Care | Payer: Medicare HMO | Source: Ambulatory Visit | Attending: Gynecologic Oncology | Admitting: Gynecologic Oncology

## 2020-02-15 ENCOUNTER — Other Ambulatory Visit: Payer: Self-pay

## 2020-02-15 DIAGNOSIS — Z01812 Encounter for preprocedural laboratory examination: Secondary | ICD-10-CM | POA: Diagnosis not present

## 2020-02-15 DIAGNOSIS — Z7982 Long term (current) use of aspirin: Secondary | ICD-10-CM | POA: Diagnosis not present

## 2020-02-15 DIAGNOSIS — N879 Dysplasia of cervix uteri, unspecified: Secondary | ICD-10-CM | POA: Diagnosis not present

## 2020-02-15 DIAGNOSIS — Z20822 Contact with and (suspected) exposure to covid-19: Secondary | ICD-10-CM | POA: Diagnosis not present

## 2020-02-15 DIAGNOSIS — E785 Hyperlipidemia, unspecified: Secondary | ICD-10-CM | POA: Diagnosis not present

## 2020-02-15 DIAGNOSIS — I129 Hypertensive chronic kidney disease with stage 1 through stage 4 chronic kidney disease, or unspecified chronic kidney disease: Secondary | ICD-10-CM | POA: Diagnosis not present

## 2020-02-15 DIAGNOSIS — R6889 Other general symptoms and signs: Secondary | ICD-10-CM | POA: Diagnosis not present

## 2020-02-15 DIAGNOSIS — Z01818 Encounter for other preprocedural examination: Secondary | ICD-10-CM | POA: Diagnosis not present

## 2020-02-15 DIAGNOSIS — N183 Chronic kidney disease, stage 3 unspecified: Secondary | ICD-10-CM | POA: Diagnosis not present

## 2020-02-15 DIAGNOSIS — Z79899 Other long term (current) drug therapy: Secondary | ICD-10-CM | POA: Diagnosis not present

## 2020-02-15 DIAGNOSIS — F17200 Nicotine dependence, unspecified, uncomplicated: Secondary | ICD-10-CM | POA: Diagnosis not present

## 2020-02-15 LAB — URINALYSIS, ROUTINE W REFLEX MICROSCOPIC
Bacteria, UA: NONE SEEN
Bilirubin Urine: NEGATIVE
Glucose, UA: NEGATIVE mg/dL
Ketones, ur: NEGATIVE mg/dL
Nitrite: NEGATIVE
Protein, ur: >=300 mg/dL — AB
Specific Gravity, Urine: 1.016 (ref 1.005–1.030)
pH: 5 (ref 5.0–8.0)

## 2020-02-15 LAB — COMPREHENSIVE METABOLIC PANEL
ALT: 25 U/L (ref 0–44)
AST: 26 U/L (ref 15–41)
Albumin: 4.1 g/dL (ref 3.5–5.0)
Alkaline Phosphatase: 71 U/L (ref 38–126)
Anion gap: 9 (ref 5–15)
BUN: 38 mg/dL — ABNORMAL HIGH (ref 8–23)
CO2: 21 mmol/L — ABNORMAL LOW (ref 22–32)
Calcium: 10.2 mg/dL (ref 8.9–10.3)
Chloride: 111 mmol/L (ref 98–111)
Creatinine, Ser: 1.61 mg/dL — ABNORMAL HIGH (ref 0.44–1.00)
GFR calc Af Amer: 37 mL/min — ABNORMAL LOW (ref >60)
GFR calc non Af Amer: 32 mL/min — ABNORMAL LOW (ref >60)
Glucose, Bld: 88 mg/dL (ref 70–99)
Potassium: 5 mmol/L (ref 3.5–5.1)
Sodium: 141 mmol/L (ref 135–145)
Total Bilirubin: 0.5 mg/dL (ref 0.3–1.2)
Total Protein: 8.5 g/dL — ABNORMAL HIGH (ref 6.5–8.1)

## 2020-02-15 LAB — ABO/RH: ABO/RH(D): O POS

## 2020-02-15 LAB — CBC
HCT: 27.6 % — ABNORMAL LOW (ref 36.0–46.0)
Hemoglobin: 9.1 g/dL — ABNORMAL LOW (ref 12.0–15.0)
MCH: 31 pg (ref 26.0–34.0)
MCHC: 33 g/dL (ref 30.0–36.0)
MCV: 93.9 fL (ref 80.0–100.0)
Platelets: 253 10*3/uL (ref 150–400)
RBC: 2.94 MIL/uL — ABNORMAL LOW (ref 3.87–5.11)
RDW: 18.5 % — ABNORMAL HIGH (ref 11.5–15.5)
WBC: 7.3 10*3/uL (ref 4.0–10.5)
nRBC: 0.3 % — ABNORMAL HIGH (ref 0.0–0.2)

## 2020-02-15 LAB — SARS CORONAVIRUS 2 (TAT 6-24 HRS): SARS Coronavirus 2: NEGATIVE

## 2020-02-15 NOTE — Progress Notes (Signed)
Cardiac clearance 01/31/20 received and placed in chart
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
## 2020-02-18 ENCOUNTER — Telehealth: Payer: Self-pay

## 2020-02-18 NOTE — Telephone Encounter (Signed)
Anita [name] states that she understands the pre op instructions given to her.  No questions or concerns at this time.

## 2020-02-19 ENCOUNTER — Encounter (HOSPITAL_COMMUNITY): Payer: Self-pay | Admitting: Gynecologic Oncology

## 2020-02-19 ENCOUNTER — Encounter (HOSPITAL_COMMUNITY)
Admission: RE | Disposition: A | Discharge status: Home / Self Care | Payer: Self-pay | Source: Home / Self Care | Attending: Gynecologic Oncology

## 2020-02-19 ENCOUNTER — Ambulatory Visit (HOSPITAL_COMMUNITY): Payer: Medicare HMO | Admitting: Certified Registered"

## 2020-02-19 ENCOUNTER — Ambulatory Visit (HOSPITAL_COMMUNITY): Payer: Medicare HMO | Admitting: Physician Assistant

## 2020-02-19 ENCOUNTER — Other Ambulatory Visit: Payer: Self-pay

## 2020-02-19 ENCOUNTER — Observation Stay (HOSPITAL_COMMUNITY)
Admission: RE | Admit: 2020-02-19 | Discharge: 2020-02-20 | Disposition: A | Discharge status: Home / Self Care | Payer: Medicare HMO | Attending: Gynecologic Oncology | Admitting: Gynecologic Oncology

## 2020-02-19 DIAGNOSIS — M1990 Unspecified osteoarthritis, unspecified site: Secondary | ICD-10-CM | POA: Diagnosis not present

## 2020-02-19 DIAGNOSIS — Z961 Presence of intraocular lens: Secondary | ICD-10-CM | POA: Diagnosis not present

## 2020-02-19 DIAGNOSIS — Z7982 Long term (current) use of aspirin: Secondary | ICD-10-CM | POA: Diagnosis not present

## 2020-02-19 DIAGNOSIS — F17210 Nicotine dependence, cigarettes, uncomplicated: Secondary | ICD-10-CM | POA: Insufficient documentation

## 2020-02-19 DIAGNOSIS — Z8261 Family history of arthritis: Secondary | ICD-10-CM | POA: Insufficient documentation

## 2020-02-19 DIAGNOSIS — Z9841 Cataract extraction status, right eye: Secondary | ICD-10-CM | POA: Diagnosis not present

## 2020-02-19 DIAGNOSIS — D509 Iron deficiency anemia, unspecified: Secondary | ICD-10-CM | POA: Insufficient documentation

## 2020-02-19 DIAGNOSIS — Z8541 Personal history of malignant neoplasm of cervix uteri: Secondary | ICD-10-CM | POA: Diagnosis not present

## 2020-02-19 DIAGNOSIS — Z8249 Family history of ischemic heart disease and other diseases of the circulatory system: Secondary | ICD-10-CM | POA: Insufficient documentation

## 2020-02-19 DIAGNOSIS — M4313 Spondylolisthesis, cervicothoracic region: Secondary | ICD-10-CM | POA: Insufficient documentation

## 2020-02-19 DIAGNOSIS — N870 Mild cervical dysplasia: Principal | ICD-10-CM | POA: Insufficient documentation

## 2020-02-19 DIAGNOSIS — I129 Hypertensive chronic kidney disease with stage 1 through stage 4 chronic kidney disease, or unspecified chronic kidney disease: Secondary | ICD-10-CM | POA: Diagnosis not present

## 2020-02-19 DIAGNOSIS — D638 Anemia in other chronic diseases classified elsewhere: Secondary | ICD-10-CM | POA: Diagnosis not present

## 2020-02-19 DIAGNOSIS — I70213 Atherosclerosis of native arteries of extremities with intermittent claudication, bilateral legs: Secondary | ICD-10-CM | POA: Diagnosis not present

## 2020-02-19 DIAGNOSIS — N183 Chronic kidney disease, stage 3 unspecified: Secondary | ICD-10-CM | POA: Insufficient documentation

## 2020-02-19 DIAGNOSIS — N879 Dysplasia of cervix uteri, unspecified: Secondary | ICD-10-CM | POA: Diagnosis present

## 2020-02-19 DIAGNOSIS — Z79899 Other long term (current) drug therapy: Secondary | ICD-10-CM | POA: Insufficient documentation

## 2020-02-19 DIAGNOSIS — R0902 Hypoxemia: Secondary | ICD-10-CM

## 2020-02-19 DIAGNOSIS — K219 Gastro-esophageal reflux disease without esophagitis: Secondary | ICD-10-CM | POA: Diagnosis not present

## 2020-02-19 DIAGNOSIS — Z86010 Personal history of colonic polyps: Secondary | ICD-10-CM | POA: Insufficient documentation

## 2020-02-19 DIAGNOSIS — E785 Hyperlipidemia, unspecified: Secondary | ICD-10-CM | POA: Insufficient documentation

## 2020-02-19 HISTORY — PX: ROBOTIC ASSISTED TOTAL HYSTERECTOMY WITH BILATERAL SALPINGO OOPHERECTOMY: SHX6086

## 2020-02-19 LAB — TYPE AND SCREEN
ABO/RH(D): O POS
Antibody Screen: NEGATIVE

## 2020-02-19 SURGERY — HYSTERECTOMY, TOTAL, ROBOT-ASSISTED, LAPAROSCOPIC, WITH BILATERAL SALPINGO-OOPHORECTOMY
Anesthesia: General | Laterality: Bilateral

## 2020-02-19 MED ORDER — FENTANYL CITRATE (PF) 100 MCG/2ML IJ SOLN
INTRAMUSCULAR | Status: AC
  Filled 2020-02-19: qty 4

## 2020-02-19 MED ORDER — OXYCODONE HCL 5 MG PO TABS: 5.0000 mg | ORAL_TABLET | ORAL | Status: DC | PRN

## 2020-02-19 MED ORDER — LIDOCAINE 20MG/ML (2%) 15 ML SYRINGE OPTIME
INTRAMUSCULAR | Status: DC | PRN
  Administered 2020-02-19: 1.5 mg/kg/h via INTRAVENOUS

## 2020-02-19 MED ORDER — ACETAMINOPHEN 650 MG RE SUPP
650.0000 mg | RECTAL | Status: DC | PRN
  Filled 2020-02-19: qty 1

## 2020-02-19 MED ORDER — ROCURONIUM BROMIDE 10 MG/ML (PF) SYRINGE
PREFILLED_SYRINGE | INTRAVENOUS | Status: AC
  Filled 2020-02-19: qty 10

## 2020-02-19 MED ORDER — SUGAMMADEX SODIUM 200 MG/2ML IV SOLN
INTRAVENOUS | Status: DC | PRN
  Administered 2020-02-19: 120 mg via INTRAVENOUS

## 2020-02-19 MED ORDER — LACTATED RINGERS IV SOLN: INTRAVENOUS | Status: DC

## 2020-02-19 MED ORDER — ONDANSETRON HCL 4 MG PO TABS: 4.0000 mg | ORAL_TABLET | Freq: Four times a day (QID) | ORAL | Status: DC | PRN

## 2020-02-19 MED ORDER — FENTANYL CITRATE (PF) 250 MCG/5ML IJ SOLN
INTRAMUSCULAR | Status: AC
  Filled 2020-02-19: qty 5

## 2020-02-19 MED ORDER — STERILE WATER FOR IRRIGATION IR SOLN
Status: DC | PRN
  Administered 2020-02-19: 1000 mL

## 2020-02-19 MED ORDER — PHENYLEPHRINE 40 MCG/ML (10ML) SYRINGE FOR IV PUSH (FOR BLOOD PRESSURE SUPPORT): PREFILLED_SYRINGE | INTRAVENOUS | Status: DC | PRN

## 2020-02-19 MED ORDER — FENTANYL CITRATE (PF) 250 MCG/5ML IJ SOLN
INTRAMUSCULAR | Status: DC | PRN
  Administered 2020-02-19: 50 ug via INTRAVENOUS
  Administered 2020-02-19: 100 ug via INTRAVENOUS

## 2020-02-19 MED ORDER — LIDOCAINE 2% (20 MG/ML) 5 ML SYRINGE
INTRAMUSCULAR | Status: AC
  Filled 2020-02-19: qty 5

## 2020-02-19 MED ORDER — SODIUM CHLORIDE 0.9% FLUSH: 3.0000 mL | INTRAVENOUS | Status: DC | PRN

## 2020-02-19 MED ORDER — SODIUM CHLORIDE 0.9 % IV SOLN: 250.0000 mL | INTRAVENOUS | Status: DC | PRN

## 2020-02-19 MED ORDER — BUPIVACAINE HCL 0.25 % IJ SOLN
INTRAMUSCULAR | Status: AC
  Filled 2020-02-19: qty 1

## 2020-02-19 MED ORDER — KETAMINE HCL 10 MG/ML IJ SOLN
INTRAMUSCULAR | Status: AC
  Filled 2020-02-19: qty 1

## 2020-02-19 MED ORDER — BUPIVACAINE HCL 0.25 % IJ SOLN
INTRAMUSCULAR | Status: DC | PRN
  Administered 2020-02-19: 35 mL

## 2020-02-19 MED ORDER — ALBUTEROL SULFATE (2.5 MG/3ML) 0.083% IN NEBU
INHALATION_SOLUTION | RESPIRATORY_TRACT | Status: AC
  Filled 2020-02-19: qty 3

## 2020-02-19 MED ORDER — FENTANYL CITRATE (PF) 100 MCG/2ML IJ SOLN: 25.0000 ug | INTRAMUSCULAR | Status: DC | PRN

## 2020-02-19 MED ORDER — CEFAZOLIN SODIUM-DEXTROSE 2-4 GM/100ML-% IV SOLN
2.0000 g | INTRAVENOUS | Status: AC
  Administered 2020-02-19: 12:00:00 2 g via INTRAVENOUS
  Filled 2020-02-19: qty 100

## 2020-02-19 MED ORDER — OXYCODONE HCL 5 MG PO TABS
5.0000 mg | ORAL_TABLET | ORAL | Status: DC | PRN
  Filled 2020-02-19: qty 1

## 2020-02-19 MED ORDER — MIDAZOLAM HCL 2 MG/2ML IJ SOLN
INTRAMUSCULAR | Status: AC
  Filled 2020-02-19: qty 2

## 2020-02-19 MED ORDER — LIDOCAINE 2% (20 MG/ML) 5 ML SYRINGE
INTRAMUSCULAR | Status: DC | PRN
  Administered 2020-02-19: 50 mg via INTRAVENOUS

## 2020-02-19 MED ORDER — ONDANSETRON HCL 4 MG/2ML IJ SOLN: 4.0000 mg | Freq: Once | INTRAMUSCULAR | Status: DC | PRN

## 2020-02-19 MED ORDER — OXYCODONE HCL 5 MG/5ML PO SOLN: 5.0000 mg | Freq: Once | ORAL | Status: AC | PRN

## 2020-02-19 MED ORDER — MORPHINE SULFATE (PF) 4 MG/ML IV SOLN
2.0000 mg | INTRAVENOUS | Status: DC | PRN
  Administered 2020-02-19: 2 mg via INTRAVENOUS
  Filled 2020-02-19: qty 1

## 2020-02-19 MED ORDER — OXYCODONE HCL 5 MG PO TABS
5.0000 mg | ORAL_TABLET | Freq: Once | ORAL | Status: AC | PRN
  Administered 2020-02-19: 5 mg via ORAL

## 2020-02-19 MED ORDER — DEXAMETHASONE SODIUM PHOSPHATE 4 MG/ML IJ SOLN
4.0000 mg | INTRAMUSCULAR | Status: AC
  Administered 2020-02-19: 12:00:00 4 mg via INTRAVENOUS

## 2020-02-19 MED ORDER — ONDANSETRON HCL 4 MG/2ML IJ SOLN: 4.0000 mg | Freq: Four times a day (QID) | INTRAMUSCULAR | Status: DC | PRN

## 2020-02-19 MED ORDER — ONDANSETRON HCL 4 MG/2ML IJ SOLN
INTRAMUSCULAR | Status: DC | PRN
  Administered 2020-02-19: 4 mg via INTRAVENOUS

## 2020-02-19 MED ORDER — ROCURONIUM BROMIDE 10 MG/ML (PF) SYRINGE
PREFILLED_SYRINGE | INTRAVENOUS | Status: DC | PRN
  Administered 2020-02-19: 50 mg via INTRAVENOUS
  Administered 2020-02-19: 20 mg via INTRAVENOUS

## 2020-02-19 MED ORDER — DEXTROSE-NACL 5-0.45 % IV SOLN: INTRAVENOUS | Status: DC

## 2020-02-19 MED ORDER — PHENYLEPHRINE 40 MCG/ML (10ML) SYRINGE FOR IV PUSH (FOR BLOOD PRESSURE SUPPORT)
PREFILLED_SYRINGE | INTRAVENOUS | Status: DC | PRN
  Administered 2020-02-19: 160 ug via INTRAVENOUS

## 2020-02-19 MED ORDER — PROPOFOL 10 MG/ML IV BOLUS
INTRAVENOUS | Status: DC | PRN
  Administered 2020-02-19: 130 mg via INTRAVENOUS

## 2020-02-19 MED ORDER — MIDAZOLAM HCL 2 MG/2ML IJ SOLN
INTRAMUSCULAR | Status: DC | PRN
  Administered 2020-02-19: 2 mg via INTRAVENOUS

## 2020-02-19 MED ORDER — SODIUM CHLORIDE 0.9% FLUSH: 3.0000 mL | Freq: Two times a day (BID) | INTRAVENOUS | Status: DC

## 2020-02-19 MED ORDER — SIMETHICONE 80 MG PO CHEW: 80.0000 mg | CHEWABLE_TABLET | Freq: Four times a day (QID) | ORAL | Status: DC | PRN

## 2020-02-19 MED ORDER — PHENOL 1.4 % MT LIQD
1.0000 | OROMUCOSAL | Status: DC | PRN
  Administered 2020-02-19: 1 via OROMUCOSAL
  Filled 2020-02-19: qty 177

## 2020-02-19 MED ORDER — ACETAMINOPHEN 325 MG PO TABS: 650.0000 mg | ORAL_TABLET | ORAL | Status: DC | PRN

## 2020-02-19 MED ORDER — GLYCOPYRROLATE 0.2 MG/ML IJ SOLN
INTRAMUSCULAR | Status: DC | PRN
  Administered 2020-02-19: .2 mg via INTRAVENOUS

## 2020-02-19 MED ORDER — PHENYLEPHRINE 40 MCG/ML (10ML) SYRINGE FOR IV PUSH (FOR BLOOD PRESSURE SUPPORT)
PREFILLED_SYRINGE | INTRAVENOUS | Status: AC
  Filled 2020-02-19: qty 10

## 2020-02-19 MED ORDER — ACETAMINOPHEN 500 MG PO TABS
1000.0000 mg | ORAL_TABLET | Freq: Four times a day (QID) | ORAL | Status: DC
  Administered 2020-02-19 – 2020-02-20 (×3): 1000 mg via ORAL
  Filled 2020-02-19 (×3): qty 2

## 2020-02-19 MED ORDER — OXYCODONE HCL 5 MG PO TABS
ORAL_TABLET | ORAL | Status: AC
  Filled 2020-02-19: qty 1

## 2020-02-19 MED ORDER — SENNA 8.6 MG PO TABS
1.0000 | ORAL_TABLET | Freq: Two times a day (BID) | ORAL | Status: DC
  Administered 2020-02-19 – 2020-02-20 (×2): 8.6 mg via ORAL
  Filled 2020-02-19 (×2): qty 1

## 2020-02-19 MED ORDER — LACTATED RINGERS IR SOLN
Status: DC | PRN
  Administered 2020-02-19: 1000 mL

## 2020-02-19 MED ORDER — ACETAMINOPHEN 500 MG PO TABS
1000.0000 mg | ORAL_TABLET | ORAL | Status: AC
  Administered 2020-02-19: 1000 mg via ORAL
  Filled 2020-02-19: qty 2

## 2020-02-19 MED ORDER — GABAPENTIN 100 MG PO CAPS
100.0000 mg | ORAL_CAPSULE | Freq: Three times a day (TID) | ORAL | Status: DC
  Administered 2020-02-19 – 2020-02-20 (×2): 100 mg via ORAL
  Filled 2020-02-19 (×2): qty 1

## 2020-02-19 MED ORDER — HEPARIN SODIUM (PORCINE) 5000 UNIT/ML IJ SOLN
5000.0000 [IU] | INTRAMUSCULAR | Status: AC
  Administered 2020-02-19: 5000 [IU] via SUBCUTANEOUS
  Filled 2020-02-19: qty 1

## 2020-02-19 MED ORDER — GABAPENTIN 100 MG PO CAPS
100.0000 mg | ORAL_CAPSULE | ORAL | Status: AC
  Administered 2020-02-19: 100 mg via ORAL
  Filled 2020-02-19: qty 1

## 2020-02-19 MED ORDER — KETAMINE HCL 10 MG/ML IJ SOLN
INTRAMUSCULAR | Status: DC | PRN
  Administered 2020-02-19: 25 mg via INTRAVENOUS

## 2020-02-19 MED ORDER — ALBUTEROL SULFATE (2.5 MG/3ML) 0.083% IN NEBU
2.5000 mg | INHALATION_SOLUTION | Freq: Once | RESPIRATORY_TRACT | Status: AC
  Administered 2020-02-19: 15:00:00 2.5 mg via RESPIRATORY_TRACT

## 2020-02-19 SURGICAL SUPPLY — 68 items
APPLICATOR SURGIFLO ENDO (HEMOSTASIS) IMPLANT
BACTOSHIELD CHG 4% 4OZ (MISCELLANEOUS) ×2
BAG LAPAROSCOPIC 12 15 PORT 16 (BASKET) IMPLANT
BAG RETRIEVAL 12/15 (BASKET)
BAG RETRIEVAL 12/15MM (BASKET)
BLADE SURG SZ10 CARB STEEL (BLADE) IMPLANT
COVER BACK TABLE 60X90IN (DRAPES) ×3 IMPLANT
COVER TIP SHEARS 8 DVNC (MISCELLANEOUS) ×1 IMPLANT
COVER TIP SHEARS 8MM DA VINCI (MISCELLANEOUS) ×3
COVER WAND RF STERILE (DRAPES) IMPLANT
DECANTER SPIKE VIAL GLASS SM (MISCELLANEOUS) ×2 IMPLANT
DERMABOND ADVANCED (GAUZE/BANDAGES/DRESSINGS) ×2
DERMABOND ADVANCED .7 DNX12 (GAUZE/BANDAGES/DRESSINGS) ×1 IMPLANT
DRAPE ARM DVNC X/XI (DISPOSABLE) ×4 IMPLANT
DRAPE COLUMN DVNC XI (DISPOSABLE) ×1 IMPLANT
DRAPE DA VINCI XI ARM (DISPOSABLE) ×12
DRAPE DA VINCI XI COLUMN (DISPOSABLE) ×3
DRAPE SHEET LG 3/4 BI-LAMINATE (DRAPES) ×3 IMPLANT
DRAPE SURG IRRIG POUCH 19X23 (DRAPES) ×3 IMPLANT
DRSG OPSITE POSTOP 4X6 (GAUZE/BANDAGES/DRESSINGS) IMPLANT
DRSG OPSITE POSTOP 4X8 (GAUZE/BANDAGES/DRESSINGS) IMPLANT
ELECT REM PT RETURN 15FT ADLT (MISCELLANEOUS) ×3 IMPLANT
GLOVE BIO SURGEON STRL SZ 6 (GLOVE) ×12 IMPLANT
GLOVE BIO SURGEON STRL SZ 6.5 (GLOVE) ×2 IMPLANT
GLOVE BIO SURGEONS STRL SZ 6.5 (GLOVE) ×2
GOWN STRL REUS W/ TWL LRG LVL3 (GOWN DISPOSABLE) ×4 IMPLANT
GOWN STRL REUS W/TWL LRG LVL3 (GOWN DISPOSABLE) ×12
HOLDER FOLEY CATH W/STRAP (MISCELLANEOUS) ×3 IMPLANT
IRRIG SUCT STRYKERFLOW 2 WTIP (MISCELLANEOUS) ×3
IRRIGATION SUCT STRKRFLW 2 WTP (MISCELLANEOUS) ×1 IMPLANT
KIT PROCEDURE DA VINCI SI (MISCELLANEOUS)
KIT PROCEDURE DVNC SI (MISCELLANEOUS) IMPLANT
KIT TURNOVER KIT A (KITS) IMPLANT
MANIPULATOR UTERINE 4.5 ZUMI (MISCELLANEOUS) ×3 IMPLANT
NDL HYPO 21X1.5 SAFETY (NEEDLE) ×1 IMPLANT
NDL SPNL 18GX3.5 QUINCKE PK (NEEDLE) IMPLANT
NEEDLE HYPO 21X1.5 SAFETY (NEEDLE) ×3 IMPLANT
NEEDLE SPNL 18GX3.5 QUINCKE PK (NEEDLE) IMPLANT
OBTURATOR OPTICAL STANDARD 8MM (TROCAR) ×3
OBTURATOR OPTICAL STND 8 DVNC (TROCAR) ×1
OBTURATOR OPTICALSTD 8 DVNC (TROCAR) ×1 IMPLANT
PACK ROBOT GYN CUSTOM WL (TRAY / TRAY PROCEDURE) ×3 IMPLANT
PAD POSITIONING PINK XL (MISCELLANEOUS) ×3 IMPLANT
PENCIL SMOKE EVACUATOR (MISCELLANEOUS) IMPLANT
PORT ACCESS TROCAR AIRSEAL 12 (TROCAR) ×1 IMPLANT
PORT ACCESS TROCAR AIRSEAL 5M (TROCAR) ×2
POUCH SPECIMEN RETRIEVAL 10MM (ENDOMECHANICALS) IMPLANT
SCRUB CHG 4% DYNA-HEX 4OZ (MISCELLANEOUS) ×1 IMPLANT
SEAL CANN UNIV 5-8 DVNC XI (MISCELLANEOUS) ×3 IMPLANT
SEAL XI 5MM-8MM UNIVERSAL (MISCELLANEOUS) ×12
SET TRI-LUMEN FLTR TB AIRSEAL (TUBING) ×3 IMPLANT
SPONGE LAP 18X18 RF (DISPOSABLE) IMPLANT
SURGIFLO W/THROMBIN 8M KIT (HEMOSTASIS) IMPLANT
SUT MNCRL AB 4-0 PS2 18 (SUTURE) IMPLANT
SUT PDS AB 1 TP1 96 (SUTURE) IMPLANT
SUT VIC AB 0 CT1 27 (SUTURE)
SUT VIC AB 0 CT1 27XBRD ANTBC (SUTURE) IMPLANT
SUT VIC AB 2-0 CT1 27 (SUTURE)
SUT VIC AB 2-0 CT1 TAPERPNT 27 (SUTURE) IMPLANT
SUT VICRYL 4-0 PS2 18IN ABS (SUTURE) ×6 IMPLANT
SYR 10ML LL (SYRINGE) IMPLANT
TOWEL OR NON WOVEN STRL DISP B (DISPOSABLE) ×3 IMPLANT
TRAP SPECIMEN MUCOUS 40CC (MISCELLANEOUS) IMPLANT
TRAY FOLEY MTR SLVR 16FR STAT (SET/KITS/TRAYS/PACK) ×3 IMPLANT
TROCAR XCEL NON-BLD 5MMX100MML (ENDOMECHANICALS) IMPLANT
UNDERPAD 30X36 HEAVY ABSORB (UNDERPADS AND DIAPERS) ×3 IMPLANT
WATER STERILE IRR 1000ML POUR (IV SOLUTION) ×3 IMPLANT
YANKAUER SUCT BULB TIP 10FT TU (MISCELLANEOUS) IMPLANT

## 2020-02-19 NOTE — Anesthesia Preprocedure Evaluation (Signed)
Anesthesia Evaluation  Patient identified by MRN, date of birth, ID band Patient awake    Reviewed: Allergy & Precautions, NPO status , Patient's Chart, lab work & pertinent test results  History of Anesthesia Complications Negative for: history of anesthetic complications  Airway Mallampati: II  TM Distance: >3 FB Neck ROM: Full    Dental  (+) Edentulous Upper, Edentulous Lower   Pulmonary Current Smoker and Patient abstained from smoking.,    Pulmonary exam normal        Cardiovascular hypertension, + Peripheral Vascular Disease  Normal cardiovascular exam     Neuro/Psych negative neurological ROS  negative psych ROS   GI/Hepatic Neg liver ROS, GERD  ,  Endo/Other  negative endocrine ROS  Renal/GU Renal disease (CKDIII)  negative genitourinary   Musculoskeletal negative musculoskeletal ROS (+)   Abdominal   Peds  Hematology  (+) anemia ,   Anesthesia Other Findings  Echo 12/26/19: EF 70-75%, gr 1 dd, normal valves  Cleared by cardiology 01/31/2020.  Per Coletta Memos, NP, "Given past medical history and time since last visit, based on ACC/AHA guidelines,Scotty J Williamswould be at acceptable risk for the planned procedure without further cardiovascular testing.  Her RCRI is class II risk, 0.9% risk of major cardiac event."  Reproductive/Obstetrics                            Anesthesia Physical Anesthesia Plan  ASA: III  Anesthesia Plan: General   Post-op Pain Management:    Induction: Intravenous  PONV Risk Score and Plan: 2 and Ondansetron, Dexamethasone, Treatment may vary due to age or medical condition and Midazolam  Airway Management Planned: Oral ETT  Additional Equipment: None  Intra-op Plan:   Post-operative Plan: Extubation in OR  Informed Consent: I have reviewed the patients History and Physical, chart, labs and discussed the procedure including the risks,  benefits and alternatives for the proposed anesthesia with the patient or authorized representative who has indicated his/her understanding and acceptance.     Dental advisory given  Plan Discussed with:   Anesthesia Plan Comments:         Anesthesia Quick Evaluation

## 2020-02-19 NOTE — Transfer of Care (Signed)
Immediate Anesthesia Transfer of Care Note  Patient: Anita [name]  Procedure(s) Performed: XI ROBOTIC ASSISTED TOTAL HYSTERECTOMY WITH BILATERAL SALPINGO OOPHORECTOMY (Bilateral )  Patient Location: PACU  Anesthesia Type:General  Level of Consciousness: sedated, drowsy, patient cooperative and responds to stimulation  Airway & Oxygen Therapy: Patient Spontanous Breathing and Patient connected to face mask oxygen  Post-op Assessment: Report given to RN and Post -op Vital signs reviewed and stable  Post vital signs: stable  Last Vitals:  Vitals Value Taken Time  BP 138/61 02/19/20 1349  Temp    Pulse 71 02/19/20 1356  Resp 24 02/19/20 1356  SpO2 94 % 02/19/20 1356  Vitals shown include unvalidated device data.  Last Pain:  Vitals:   02/19/20 1017  TempSrc:   PainSc: 0-No pain         Complications: No apparent anesthesia complications

## 2020-02-19 NOTE — Progress Notes (Addendum)
Patient alert, oriented, sitting in chair in no acute distress.  Answering questions appropriately.  Complaining of sore throat.  Pulse ox reading 88-89 on 3 L while talking with the patient.  Moved pulse ox to another finger with no change then to the left hand with no significant change.  Encouraged IS use.  Patient's saturations around 94-95% after using IS but 1 minute after, sats back to 88-89% on 3L.  Lungs clear but slightly diminished in the bases. HR regular rate and rhythm. Chloraseptic throat spray ordered per verbal order with Dr. Denman George.  Abdominal incisions intact with dermabond with no active drainage.  Minimal vaginal bleeding noted. Continue to monitor for the next hour for improvement.  Plan for admission if no improvement with goals for discharge in the am. No concerns voiced per pt except wanting to speak with her daughter. Dr. Berline Lopes and Dr. Denman George updated on the situation.

## 2020-02-19 NOTE — Op Note (Signed)
OPERATIVE NOTE  Pre-operative Diagnosis: cervical dysplasia  Post-operative Diagnosis: same  Operation: Robotic-assisted laparoscopic total hysterectomy with bilateral salpingoophorectomy, SLN biopsy   Surgeon: Jeral Pinch MD  Assistant Surgeon: Lahoma Crocker MD (an MD assistant was necessary for tissue manipulation, management of robotic instrumentation, retraction and positioning due to the complexity of the case and hospital policies).   Anesthesia: GET  Urine Output: 100cc  Operative Findings: On EUA, small mobile uterus. On speculum exam, cervix flush with the vagina. On intra-abdominal entry, normal upper abdominal survey. Normal small and large bowel, appendix and omentum. Uterus 6cm and normal appearing. Normal adnexa.   Estimated Blood Loss:  25 mL      Total IV Fluids: 1,000 ml         Specimens: uterus, cervix, bilateral tubes and ovaries         Complications:  None apparent; patient tolerated the procedure well.         Disposition: PACU - hemodynamically stable.  Procedure Details  The patient was seen in the Holding Room. The risks, benefits, complications, treatment options, and expected outcomes were discussed with the patient.  The patient concurred with the proposed plan, giving informed consent.  The site of surgery properly noted/marked. The patient was identified as Anita [name] and the procedure verified as a Robotic-assisted hysterectomy with bilateral salpingo oophorectomy.   After induction of anesthesia, the patient was draped and prepped in the usual sterile manner. Patient was placed in supine position after anesthesia and draped and prepped in the usual sterile manner as follows: Her arms were tucked to her side with all appropriate precautions.  The shoulders were stabilized with padded shoulder blocks applied to the acromium processes.  The patient was placed in the semi-lithotomy position in [place].  The perineum and vagina were  prepped with CholoraPrep. The patient was draped after the CholoraPrep had been allowed to dry for 3 minutes.  A Time Out was held and the above information confirmed.  The urethra was prepped with Betadine. Foley catheter was placed.  A sterile speculum was placed in the vagina.  The cervix was grasped with a single-tooth tenaculum. The cervix was dilated with Kennon Rounds dilators.  The ZUMI uterine manipulator with a medium colpotomizer ring was placed without difficulty.  OG tube placement was confirmed and to suction.   Next, a 10 mm skin incision was made 1 cm below the subcostal margin in the midclavicular line.  The 5 mm Optiview port and scope was used for direct entry.  Opening pressure was under 10 mm CO2.  The abdomen was insufflated and the findings were noted as above.   At this point and all points during the procedure, the patient's intra-abdominal pressure did not exceed 15 mmHg. Next, an 8 mm skin incision was made superior the umbilicus and a right and left port were placed about 8 cm lateral to the robot port on the right and left side.  A fourth arm was placed on the right.  The 5 mm assist trocar was exchanged for a 10-12 mm port. All ports were placed under direct visualization.  The patient was placed in steep Trendelenburg.  Bowel was folded away into the upper abdomen.  The robot was docked in the normal manner.  The right and left peritoneum were opened parallel to the IP ligament to open the retroperitoneal spaces bilaterally. The round ligaments were transected. The ureter was noted to be on the medial leaf of the broad ligament.  The peritoneum above the ureter was incised and stretched and the infundibulopelvic ligament was skeletonized, cauterized and cut.    The posterior peritoneum was taken down to the level of the KOH ring.  The anterior peritoneum was also taken down.  The bladder flap was created to the level of the KOH ring.  The uterine artery on the right side was  skeletonized, cauterized and cut in the normal manner.  A similar procedure was performed on the left.  The colpotomy was made and the uterus, cervix, bilateral ovaries and tubes were amputated and delivered through the vagina.  Pedicles were inspected and excellent hemostasis was achieved.    The colpotomy at the vaginal cuff was closed with Vicryl on a CT1 needle in a running manner.  Irrigation was used and excellent hemostasis was achieved.  At this point in the procedure was completed.  Robotic instruments were removed under direct visulaization.  The robot was undocked. The fascia at the 10-12 mm port was closed with 0 Vicryl on a UR-5 needle.  The subcuticular tissue was closed with 4-0 Vicryl and the skin was closed with 4-0 Monocryl in a subcuticular manner.  Dermabond was applied.    The vagina was swabbed with  minimal bleeding noted.   All sponge, lap and needle counts were correct x  3.   The patient was transferred to the recovery room in stable condition.  Jeral Pinch, MD

## 2020-02-19 NOTE — Interval H&P Note (Signed)
History and Physical Interval Note:  02/19/2020 10:55 AM  Anita [name]  has presented today for surgery, with the diagnosis of CERVICAL DYSPLASIA.  The various methods of treatment have been discussed with the patient and family. After consideration of risks, benefits and other options for treatment, the patient has consented to  Procedure(s): XI ROBOTIC ASSISTED TOTAL HYSTERECTOMY WITH BILATERAL SALPINGO OOPHORECTOMY (Bilateral) as a surgical intervention.  The patient's history has been reviewed, patient examined, no change in status, stable for surgery.  I have reviewed the patient's chart and labs.  Questions were answered to the patient's satisfaction.     Lafonda Mosses

## 2020-02-19 NOTE — Anesthesia Procedure Notes (Signed)
Procedure Name: Intubation Date/Time: 02/19/2020 12:00 PM Performed by: Pilar Grammes, CRNA Pre-anesthesia Checklist: Patient identified, Emergency Drugs available, Suction available, Patient being monitored and Timeout performed Patient Re-evaluated:Patient Re-evaluated prior to induction Oxygen Delivery Method: Circle system utilized Preoxygenation: Pre-oxygenation with 100% oxygen Induction Type: IV induction Ventilation: Mask ventilation without difficulty Laryngoscope Size: Miller, 3 and 2 Grade View: Grade I Tube type: Oral Tube size: 7.0 mm Number of attempts: 1 Airway Equipment and Method: Stylet Placement Confirmation: positive ETCO2,  ETT inserted through vocal cords under direct vision,  CO2 detector and breath sounds checked- equal and bilateral Secured at: 21 cm Tube secured with: Tape Dental Injury: Teeth and Oropharynx as per pre-operative assessment

## 2020-02-20 ENCOUNTER — Telehealth: Payer: Self-pay | Admitting: *Deleted

## 2020-02-20 ENCOUNTER — Encounter: Payer: Self-pay | Admitting: *Deleted

## 2020-02-20 DIAGNOSIS — I70213 Atherosclerosis of native arteries of extremities with intermittent claudication, bilateral legs: Secondary | ICD-10-CM | POA: Diagnosis not present

## 2020-02-20 DIAGNOSIS — Z8541 Personal history of malignant neoplasm of cervix uteri: Secondary | ICD-10-CM | POA: Diagnosis not present

## 2020-02-20 DIAGNOSIS — K219 Gastro-esophageal reflux disease without esophagitis: Secondary | ICD-10-CM | POA: Diagnosis not present

## 2020-02-20 DIAGNOSIS — N183 Chronic kidney disease, stage 3 unspecified: Secondary | ICD-10-CM | POA: Diagnosis not present

## 2020-02-20 DIAGNOSIS — R0902 Hypoxemia: Secondary | ICD-10-CM

## 2020-02-20 DIAGNOSIS — E785 Hyperlipidemia, unspecified: Secondary | ICD-10-CM | POA: Diagnosis not present

## 2020-02-20 DIAGNOSIS — I129 Hypertensive chronic kidney disease with stage 1 through stage 4 chronic kidney disease, or unspecified chronic kidney disease: Secondary | ICD-10-CM | POA: Diagnosis not present

## 2020-02-20 DIAGNOSIS — M1990 Unspecified osteoarthritis, unspecified site: Secondary | ICD-10-CM | POA: Diagnosis not present

## 2020-02-20 DIAGNOSIS — N870 Mild cervical dysplasia: Secondary | ICD-10-CM | POA: Diagnosis not present

## 2020-02-20 DIAGNOSIS — F17210 Nicotine dependence, cigarettes, uncomplicated: Secondary | ICD-10-CM | POA: Diagnosis not present

## 2020-02-20 LAB — BASIC METABOLIC PANEL
Anion gap: 5 (ref 5–15)
Anion gap: 6 (ref 5–15)
BUN: 42 mg/dL — ABNORMAL HIGH (ref 8–23)
BUN: 41 mg/dL — ABNORMAL HIGH (ref 8–23)
CO2: 18 mmol/L — ABNORMAL LOW (ref 22–32)
CO2: 19 mmol/L — ABNORMAL LOW (ref 22–32)
Calcium: 9.3 mg/dL (ref 8.9–10.3)
Calcium: 9.9 mg/dL (ref 8.9–10.3)
Chloride: 109 mmol/L (ref 98–111)
Chloride: 109 mmol/L (ref 98–111)
Creatinine, Ser: 1.51 mg/dL — ABNORMAL HIGH (ref 0.44–1.00)
Creatinine, Ser: 1.54 mg/dL — ABNORMAL HIGH (ref 0.44–1.00)
GFR calc Af Amer: 40 mL/min — ABNORMAL LOW (ref >60)
GFR calc Af Amer: 39 mL/min — ABNORMAL LOW (ref >60)
GFR calc non Af Amer: 34 mL/min — ABNORMAL LOW (ref >60)
GFR calc non Af Amer: 34 mL/min — ABNORMAL LOW (ref >60)
Glucose, Bld: 107 mg/dL — ABNORMAL HIGH (ref 70–99)
Glucose, Bld: 104 mg/dL — ABNORMAL HIGH (ref 70–99)
Potassium: 6.3 mmol/L (ref 3.5–5.1)
Potassium: 5.5 mmol/L — ABNORMAL HIGH (ref 3.5–5.1)
Sodium: 132 mmol/L — ABNORMAL LOW (ref 135–145)
Sodium: 134 mmol/L — ABNORMAL LOW (ref 135–145)

## 2020-02-20 LAB — CBC
HCT: 24 % — ABNORMAL LOW (ref 36.0–46.0)
Hemoglobin: 7.7 g/dL — ABNORMAL LOW (ref 12.0–15.0)
MCH: 29.6 pg (ref 26.0–34.0)
MCHC: 32.1 g/dL (ref 30.0–36.0)
MCV: 92.3 fL (ref 80.0–100.0)
Platelets: 194 10*3/uL (ref 150–400)
RBC: 2.6 MIL/uL — ABNORMAL LOW (ref 3.87–5.11)
RDW: 18.1 % — ABNORMAL HIGH (ref 11.5–15.5)
WBC: 6.9 10*3/uL (ref 4.0–10.5)
nRBC: 0 % (ref 0.0–0.2)

## 2020-02-20 NOTE — Discharge Summary (Signed)
Physician Discharge Summary  Patient ID: Anita [name] MRN: [id number] DOB/AGE: 1946/10/11 74 y.o.  Admit date: 02/19/2020 Discharge date: 02/20/2020  Admission Diagnoses: Cervical dysplasia  Discharge Diagnoses:  Principal Problem:   Cervical dysplasia Active Problems:   Oxygen desaturation   Discharged Condition:  The patient is in good condition and stable for discharge.  Hospital Course: On 02/19/2020, the patient underwent the following: Procedure(s): XI ROBOTIC ASSISTED TOTAL HYSTERECTOMY WITH BILATERAL SALPINGO OOPHORECTOMY.  The postoperative course was uneventful except for oxygen desaturation post-operatively with oxygen levels in the high 70s off supplemental oxygen, 94-95 % on 3 L immediately post-op.  She was discharged to home on postoperative day 1 tolerating a regular diet, voiding, ambulating, oxygen level at 95-97 on room air, minimal pain. No chest pain, dyspnea reported.  Consults: None  Significant Diagnostic Studies: None  Treatments: surgery: see above  Discharge Exam: Blood pressure (!) 131/57, pulse 87, temperature 98 F (36.7 C), temperature source Oral, resp. rate 18, height 5\' 2"  (1.575 m), weight 130 lb (59 kg), SpO2 97 %. General appearance: alert, cooperative and no distress Resp: clear to auscultation bilaterally Cardio: regular rate and rhythm, S1, S2 normal, no murmur, click, rub or gallop GI: soft, non-tender; bowel sounds normal; no masses,  no organomegaly Extremities: extremities normal, atraumatic, no cyanosis or edema Incision/Wound: Lap sites to the abdomen with dermabond without erythema or drainage  Disposition: Discharge disposition: 01-Home or Self Care       Discharge Instructions    Call MD for:  difficulty breathing, headache or visual disturbances   Complete by: As directed    Call MD for:  difficulty breathing, headache or visual disturbances   Complete by: As directed    Call MD for:  extreme fatigue   Complete by: As  directed    Call MD for:  extreme fatigue   Complete by: As directed    Call MD for:  hives   Complete by: As directed    Call MD for:  persistant dizziness or light-headedness   Complete by: As directed    Call MD for:  persistant dizziness or light-headedness   Complete by: As directed    Call MD for:  persistant nausea and vomiting   Complete by: As directed    Call MD for:  persistant nausea and vomiting   Complete by: As directed    Call MD for:  redness, tenderness, or signs of infection (pain, swelling, redness, odor or green/yellow discharge around incision site)   Complete by: As directed    Call MD for:  redness, tenderness, or signs of infection (pain, swelling, redness, odor or green/yellow discharge around incision site)   Complete by: As directed    Call MD for:  severe uncontrolled pain   Complete by: As directed    Call MD for:  severe uncontrolled pain   Complete by: As directed    Call MD for:  temperature >100.4   Complete by: As directed    Call MD for:  temperature >100.4   Complete by: As directed    Diet - low sodium heart healthy   Complete by: As directed    Diet general   Complete by: As directed    Driving Restrictions   Complete by: As directed    No driving x 72 hrs   Driving Restrictions   Complete by: As directed    No driving for 1 week minimum.  Do not take narcotics and drive.   Increase  activity slowly   Complete by: As directed    Gradually increase over 2 - 7 days   Increase activity slowly   Complete by: As directed    Lifting restrictions   Complete by: As directed    Avoid lifting > 20 lbs x 2 weeks.   Lifting restrictions   Complete by: As directed    No lifting greater than 10 lbs.   May shower / Bathe   Complete by: As directed    May walk up steps   Complete by: As directed    No dressing needed   Complete by: As directed    Other Restrictions   Complete by: As directed    Exercise in 5 - 10 days.  Swim in 7 days.   Douching is not recommended.   Sexual Activity Restrictions   Complete by: As directed    No intercourse x 6 weeks   Sexual Activity Restrictions   Complete by: As directed    No sexual activity, nothing in the vagina, for 8 weeks.     Allergies as of 02/20/2020   No Known Allergies     Medication List    STOP taking these medications   acetaminophen 325 MG tablet Commonly known as: Tylenol     TAKE these medications   amLODipine 10 MG tablet Commonly known as: NORVASC Take 1 tablet (10 mg total) by mouth daily.   aspirin EC 81 MG tablet Take 81 mg by mouth daily.   atorvastatin 20 MG tablet Commonly known as: LIPITOR Take 1 tablet (20 mg total) by mouth daily. What changed: when to take this   cholecalciferol 25 MCG (1000 UNIT) tablet Commonly known as: VITAMIN D3 Take 1,000 Units by mouth daily.   ferrous sulfate 324 MG Tbec Take 324 mg by mouth daily with breakfast.   MAGNESIUM PO Take 1 tablet by mouth daily.   oxyCODONE 5 MG immediate release tablet Commonly known as: Oxy IR/ROXICODONE Take 1 tablet (5 mg total) by mouth every 4 (four) hours as needed for severe pain. For AFTER surgery, do not take and drive   REFRESH OPTIVE OP Place 2 drops into both eyes every morning.   senna-docusate 8.6-50 MG tablet Commonly known as: Senokot-S Take 2 tablets by mouth at bedtime. For AFTER surgery, do not take if having loose stools   vitamin E 180 MG (400 UNITS) capsule Take 400 Units by mouth daily.      Follow-up Information    Lafonda Mosses, MD Follow up on 02/26/2020.   Specialty: Gynecologic Oncology Why: at 3:45pm will be a phone visit and Dr. Berline Lopes will call you to discuss final pathology. on 03/13/2020 at 2pm, this will be an in person visit in the office. Contact information: [place] [address] [phone]           Greater than thirty minutes were spend for face to face discharge instructions and discharge  orders/summary in EPIC.   Signed: Dorothyann Gibbs 02/20/2020, 7:56 AM

## 2020-02-20 NOTE — Telephone Encounter (Signed)
Entry error

## 2020-02-20 NOTE — Anesthesia Postprocedure Evaluation (Signed)
Anesthesia Post Note  Patient: Anita [name]  Procedure(s) Performed: XI ROBOTIC ASSISTED TOTAL HYSTERECTOMY WITH BILATERAL SALPINGO OOPHORECTOMY (Bilateral )     Anesthesia Type: General Level of consciousness: awake and alert Pain management: pain level controlled Vital Signs Assessment: post-procedure vital signs reviewed and stable Respiratory status: spontaneous breathing, nonlabored ventilation and respiratory function stable Cardiovascular status: blood pressure returned to baseline and stable Postop Assessment: no apparent nausea or vomiting Anesthetic complications: no    Last Vitals:  Vitals:   02/20/20 0744 02/20/20 0836  BP: (!) 131/57   Pulse: 87   Resp: 18   Temp: 36.7 C   SpO2: 97% 100%    Last Pain:  Vitals:   02/20/20 0836  TempSrc:   PainSc: 0-No pain   Pain Goal: Patients Stated Pain Goal: 0 (02/20/20 0744)                 Lidia Collum

## 2020-02-20 NOTE — Progress Notes (Signed)
Pt provided with d/c instructions. After discussing the pt's plan of care upon d/c home, pt denied any further questions or concerns.  

## 2020-02-20 NOTE — Progress Notes (Signed)
CRITICAL VALUE ALERT  Critical Value:  K 6.3  Date & Time Notied:  3/3 @0530   Provider Notified: Delsa Sale  Orders Received/Actions taken: Redraw potassium

## 2020-02-21 NOTE — Telephone Encounter (Signed)
LM for Ms Ben to call back to the office to see how she is doing after her DC from the hospital from her surgery 02-19-20

## 2020-02-22 ENCOUNTER — Telehealth: Payer: Self-pay

## 2020-02-22 ENCOUNTER — Inpatient Hospital Stay: Payer: Medicare HMO | Attending: Gynecologic Oncology | Admitting: Gynecologic Oncology

## 2020-02-22 ENCOUNTER — Encounter: Payer: Self-pay | Admitting: Gynecologic Oncology

## 2020-02-22 DIAGNOSIS — N879 Dysplasia of cervix uteri, unspecified: Secondary | ICD-10-CM

## 2020-02-22 DIAGNOSIS — N893 Dysplasia of vagina, unspecified: Secondary | ICD-10-CM

## 2020-02-22 LAB — SURGICAL PATHOLOGY

## 2020-02-22 NOTE — Telephone Encounter (Signed)
Anita [name] states that she is eating, drinking, and urinating well. She has moved her bowels . Continues with senokot -s as prescribed. Afebrile. Incisions are D&I. Abdominal pain minimal. She is using tylenol and an occasional Oxycodone. She is aware of her post op appointments.and office number [phone] to call if she has any questions or concerns.

## 2020-02-22 NOTE — Progress Notes (Signed)
Gynecologic Oncology Telehealth Consult Note: Gyn-Onc  I connected with Anita [name] on 02/22/20 at 10:15AM EST by telephone and verified that I am speaking with the correct person using two identifiers.  I discussed the limitations, risks, security and privacy concerns of performing an evaluation and management service by telemedicine and the availability of in-person appointments. I also discussed with the patient that there may be a patient responsible charge related to this service. The patient expressed understanding and agreed to proceed.  Other persons participating in the visit and their role in the encounter: none.  Patient's location: home Provider's location: [facility]  Reason for Visit: Follow-up after recent surgery and review of pathology, treatment planning  Treatment History: 12/15 Pap: SCC 12/21 EMB: CIN3/CIS 12/30: cervical biopsies (2 and 7 o'c) - LSIL, ECC - HSIL 12/27/19: CKC - CIN3/CIS involving all quadrants, no invasive cancer, extension into endocervical glands; ECC with fragments of benign stroma. Left vaginal fornix biopsy - LSIL.  Interval History: The patient is status post robotic hysterectomy and BSO on 3/2.  She required observation overnight secondary to hypoxia presumed related to her long history of tobacco abuse.  On postoperative day #1, she was breathing well and saturating in the high 90s off of oxygen.  She was discharged home in stable condition.  She notes that the mouth and throat pain that she was having immediately after surgery has significantly improved.  She initially had difficulty with p.o. consumption but is able to eat and drink more now.  She is voiding freely and reports return of bowel function.  She notes some minimal spotting since surgery which has decreased.  She denies any fevers or chills.  Past Medical/Surgical History: Past Medical History:  Diagnosis Date  . Arthritis   . Atherosclerosis of native artery of both lower extremities  with intermittent claudication [facility])    vascular--- dr Trula Slade--- last ABIs 07-11-2018 in epic  . Cervical cancer (HCC)    squamous cell carcinoma   . Cervical spondylosis   . Chest pain 12-24-2019  per pt no chest pain since approx. 12-05-2019, pt stated only had chest pain (no nausea, palpitations, irregular heartbeat, sob, difficulty breathing, sweating , or peripheral swelling)   per pt pcp note 11-28-2019, Dr Pricilla Holm, pt complaint with chest pain, Dr Sharlet Salina noted changes on her EKG done that day and with pt at high risk pt referred to cardiology/  pt was seen by Dr Gwenlyn Found 12-18-2019, note in epic, pt denied chest pain at this visit,  Dr Gwenlyn Found ordered carotid dopplers and echo and pt has appointment's scheduled  . Chronic neck and back pain    midline thoracic back pain  . CKD (chronic kidney disease), stage III   . Dry eyes   . Full dentures   . GERD (gastroesophageal reflux disease)    12-24-2019 per pt occasionally ,  drinks water  . Heart murmur    per Dr Gwenlyn Found assessment/ note on 12-18-2019  . History of cervical dysplasia    per pt at age 60s had cervix "scraped"  . History of trichomonal vaginitis    12-04-2019  per pap smear,  treated  . Hx of adenomatous polyp of colon 03/06/2015  . Hyperlipidemia   . Hypertension    followed by pcp   (12-24-2019 per pt never had a stress test)  . IDA (iron deficiency anemia)   . Postmenopausal bleeding   . Spondylolisthesis of cervicothoracic region    steroid injection 08-23-2019  Past Surgical History:  Procedure Laterality Date  . CATARACT EXTRACTION W/ INTRAOCULAR LENS IMPLANT Right 2018  . CERVICAL CONIZATION W/BX N/A 12/27/2019   Procedure: COLD KNIFE CONIZATION CERVIX WITH BIOPSY;  Surgeon: Lafonda Mosses, MD;  Location: [facility];  Service: Gynecology;  Laterality: N/A;  . COLONOSCOPY  02/2015  . DILATION AND CURETTAGE OF UTERUS N/A 12/27/2019   Procedure: ENDOCERVICAL CURETTAGE;  Surgeon:  Lafonda Mosses, MD;  Location: [facility];  Service: Gynecology;  Laterality: N/A;  . HEMORRHOID SURGERY  1970s  . ORIF METATARSAL FRACTURE Left 06-04-2011   dr hewitt  @[facility]    ORIF 3rd and 5th metatorsal shaft fractures/  closed treatment of 1st, 2nd, and 4th metatorsal fractures  . ROBOTIC ASSISTED TOTAL HYSTERECTOMY WITH BILATERAL SALPINGO OOPHERECTOMY Bilateral 02/19/2020   Procedure: XI ROBOTIC ASSISTED TOTAL HYSTERECTOMY WITH BILATERAL SALPINGO OOPHORECTOMY;  Surgeon: Lafonda Mosses, MD;  Location: [facility];  Service: Gynecology;  Laterality: Bilateral;  . TUBAL LIGATION  yrs ago    Family History  Problem Relation Age of Onset  . Hypertension Mother   . Arthritis Mother   . Colon cancer Neg Hx   . Esophageal cancer Neg Hx   . Rectal cancer Neg Hx   . Stomach cancer Neg Hx   . Ovarian cancer Neg Hx   . Uterine cancer Neg Hx   . Breast cancer Neg Hx     Social History   Socioeconomic History  . Marital status: Single    Spouse name: Not on file  . Number of children: 2  . Years of education: 12+  . Highest education level: Not on file  Occupational History  . Occupation: Retired  Tobacco Use  . Smoking status: Current Every Day Smoker    Packs/day: 0.50    Years: 59.00    Pack years: 29.50    Types: Cigarettes  . Smokeless tobacco: Never Used  . Tobacco comment: since age 80  Substance and Sexual Activity  . Alcohol use: Not Currently    Alcohol/week: 0.0 standard drinks    Comment: socially  . Drug use: Never  . Sexual activity: Not Currently    Birth control/protection: Post-menopausal, Surgical  Other Topics Concern  . Not on file  Social History Narrative   Lives at home with her mother.   Right-handed.   Several sodas per day.   Social Determinants of Health   Financial Resource Strain:   . Difficulty of Paying Living Expenses: Not on file  Food Insecurity:   . Worried About Charity fundraiser in the Last Year: Not on file  .  Ran Out of Food in the Last Year: Not on file  Transportation Needs:   . Lack of Transportation (Medical): Not on file  . Lack of Transportation (Non-Medical): Not on file  Physical Activity:   . Days of Exercise per Week: Not on file  . Minutes of Exercise per Session: Not on file  Stress:   . Feeling of Stress : Not on file  Social Connections:   . Frequency of Communication with Friends and Family: Not on file  . Frequency of Social Gatherings with Friends and Family: Not on file  . Attends Religious Services: Not on file  . Active Member of Clubs or Organizations: Not on file  . Attends Archivist Meetings: Not on file  . Marital Status: Not on file    Current Medications:  Current Outpatient Medications:  .  amLODipine (  NORVASC) 10 MG tablet, Take 1 tablet (10 mg total) by mouth daily., Disp: 90 tablet, Rfl: 3 .  aspirin EC 81 MG tablet, Take 81 mg by mouth daily., Disp: , Rfl:  .  atorvastatin (LIPITOR) 20 MG tablet, Take 1 tablet (20 mg total) by mouth daily. (Patient taking differently: Take 20 mg by mouth at bedtime. ), Disp: 90 tablet, Rfl: 3 .  Carboxymethylcellul-Glycerin (REFRESH OPTIVE OP), Place 2 drops into both eyes every morning., Disp: , Rfl:  .  cholecalciferol (VITAMIN D3) 25 MCG (1000 UNIT) tablet, Take 1,000 Units by mouth daily., Disp: , Rfl:  .  ferrous sulfate 324 MG TBEC, Take 324 mg by mouth daily with breakfast. , Disp: , Rfl:  .  MAGNESIUM PO, Take 1 tablet by mouth daily., Disp: , Rfl:  .  oxyCODONE (OXY IR/ROXICODONE) 5 MG immediate release tablet, Take 1 tablet (5 mg total) by mouth every 4 (four) hours as needed for severe pain. For AFTER surgery, do not take and drive, Disp: 10 tablet, Rfl: 0 .  senna-docusate (SENOKOT-S) 8.6-50 MG tablet, Take 2 tablets by mouth at bedtime. For AFTER surgery, do not take if having loose stools, Disp: 30 tablet, Rfl: 0 .  vitamin E 180 MG (400 UNITS) capsule, Take 400 Units by mouth daily., Disp: , Rfl:    Review of Symptoms: Pertinent positives as per HPI.  Otherwise review of systems negative.  Physical Exam: There were no vitals taken for this visit. Not performed given limitations of phone visit.  Laboratory & Radiologic Studies: UTERUS, BILATERAL FALLOPIAN TUBES AND OVARIES, HYSTERECTOMY WITH  SALPINGO-OOPHORECTOMY:  Uterus:    - Inactive endometrium    - No hyperplasia or malignancy identified   Cervix:    - Low grade squamous intraepithelial lesion (CIN1, mild dysplasia)    - Ulceration and previous biopsy site changes  - See comment   Bilateral Ovaries:    - Benign ovaries    - No malignancy identified   Bilateral Fallopian tubes:    - Benign fallopian tubes    - No malignancy identified   COMMENT:   P16 immunohistochemistry was performed on selected blocks to exclude the  presence of high-grade dysplasia. There is no evidence of high-grade  dysplasia in the resection specimen.   Assessment & Plan: Anita [name] is a 74 y.o. woman with initial concern for squamous cell carcinoma of the cervix based on Pap found to have high-grade cervical dysplasia on cone now status post definitive surgery (hysterectomy) with residual low-grade dysplasia noted.  The patient is overall healing well from surgery and meeting postoperative milestones.  Discussed continued restrictions and expectations over the next several weeks before I see her in clinic.  Discussed final pathology results and their significance.  Patient was happy to hear results.  Discussed importance of continued close surveillance given her history of high-grade dysplasia.  I discussed the assessment and treatment plan with the patient. The patient was provided with an opportunity to ask questions and all were answered. The patient agreed with the plan and demonstrated an understanding of the instructions.   The patient was advised to call back or see an in-person evaluation if  the symptoms worsen or if the condition fails to improve as anticipated.   12 minutes of total time was spent for this patient encounter, including preparation, face-to-face counseling with the patient and coordination of care, and documentation of the encounter.   Jeral Pinch, MD  Division of Gynecologic Oncology  Department  of Obstetrics and Gynecology  [facility]

## 2020-02-26 ENCOUNTER — Inpatient Hospital Stay: Payer: Medicare HMO | Admitting: Gynecologic Oncology

## 2020-03-05 ENCOUNTER — Ambulatory Visit: Payer: Medicare HMO | Attending: Internal Medicine

## 2020-03-05 DIAGNOSIS — Z23 Encounter for immunization: Secondary | ICD-10-CM

## 2020-03-05 DIAGNOSIS — R6889 Other general symptoms and signs: Secondary | ICD-10-CM | POA: Diagnosis not present

## 2020-03-05 NOTE — Progress Notes (Signed)
   Covid-19 Vaccination Clinic  Name:  Anita [name]    MRN: [id number] DOB: [birth year]  03/05/2020  [name] was observed post Covid-19 immunization for 15 minutes without incident. She was provided with Vaccine Information Sheet and instruction to access the V-Safe system.   [name] was instructed to call 911 with any severe reactions post vaccine: Marland Kitchen Difficulty breathing  . Swelling of face and throat  . A fast heartbeat  . A bad rash all over body  . Dizziness and weakness   Immunizations Administered    Name Date Dose VIS Date Route   Pfizer COVID-19 Vaccine 03/05/2020  4:26 PM 0.3 mL 11/30/2019 Intramuscular   Manufacturer: [place]   Lot: [id number]   [place]: [id number]

## 2020-03-10 NOTE — Progress Notes (Deleted)
Gynecologic Oncology Return Clinic Visit  ***  Reason for Visit: ***  Treatment History: 12/15 Pap: SCC 12/21 EMB: CIN3/CIS 12/30: cervical biopsies (2 and 7 o'c) - LSIL, ECC - HSIL 12/27/19: CKC - CIN3/CIS involving all quadrants, no invasive cancer, extension into endocervical glands; ECC with fragments of benign stroma. Left vaginal fornix biopsy - LSIL. 02/19/20: TRH/BSO - CIN1 of cervix, no high grade dysplasia or other abnormality noted in specimen   Interval History: ***  Past Medical/Surgical History: Past Medical History:  Diagnosis Date  . Arthritis   . Atherosclerosis of native artery of both lower extremities with intermittent claudication [facility])    vascular--- dr Trula Slade--- last ABIs 07-11-2018 in epic  . Cervical cancer (HCC)    squamous cell carcinoma   . Cervical spondylosis   . Chest pain 12-24-2019  per pt no chest pain since approx. 12-05-2019, pt stated only had chest pain (no nausea, palpitations, irregular heartbeat, sob, difficulty breathing, sweating , or peripheral swelling)   per pt pcp note 11-28-2019, Dr Pricilla Holm, pt complaint with chest pain, Dr Sharlet Salina noted changes on her EKG done that day and with pt at high risk pt referred to cardiology/  pt was seen by Dr Gwenlyn Found 12-18-2019, note in epic, pt denied chest pain at this visit,  Dr Gwenlyn Found ordered carotid dopplers and echo and pt has appointment's scheduled  . Chronic neck and back pain    midline thoracic back pain  . CKD (chronic kidney disease), stage III   . Dry eyes   . Full dentures   . GERD (gastroesophageal reflux disease)    12-24-2019 per pt occasionally ,  drinks water  . Heart murmur    per Dr Gwenlyn Found assessment/ note on 12-18-2019  . History of cervical dysplasia    per pt at age 27s had cervix "scraped"  . History of trichomonal vaginitis    12-04-2019  per pap smear,  treated  . Hx of adenomatous polyp of colon 03/06/2015  . Hyperlipidemia   . Hypertension    followed by pcp    (12-24-2019 per pt never had a stress test)  . IDA (iron deficiency anemia)   . Postmenopausal bleeding   . Spondylolisthesis of cervicothoracic region    steroid injection 08-23-2019    Past Surgical History:  Procedure Laterality Date  . CATARACT EXTRACTION W/ INTRAOCULAR LENS IMPLANT Right 2018  . CERVICAL CONIZATION W/BX N/A 12/27/2019   Procedure: COLD KNIFE CONIZATION CERVIX WITH BIOPSY;  Surgeon: Lafonda Mosses, MD;  Location: [facility];  Service: Gynecology;  Laterality: N/A;  . COLONOSCOPY  02/2015  . DILATION AND CURETTAGE OF UTERUS N/A 12/27/2019   Procedure: ENDOCERVICAL CURETTAGE;  Surgeon: Lafonda Mosses, MD;  Location: [facility];  Service: Gynecology;  Laterality: N/A;  . HEMORRHOID SURGERY  1970s  . ORIF METATARSAL FRACTURE Left 06-04-2011   dr hewitt  @[facility]    ORIF 3rd and 5th metatorsal shaft fractures/  closed treatment of 1st, 2nd, and 4th metatorsal fractures  . ROBOTIC ASSISTED TOTAL HYSTERECTOMY WITH BILATERAL SALPINGO OOPHERECTOMY Bilateral 02/19/2020   Procedure: XI ROBOTIC ASSISTED TOTAL HYSTERECTOMY WITH BILATERAL SALPINGO OOPHORECTOMY;  Surgeon: Lafonda Mosses, MD;  Location: [facility];  Service: Gynecology;  Laterality: Bilateral;  . TUBAL LIGATION  yrs ago    Family History  Problem Relation Age of Onset  . Hypertension Mother   . Arthritis Mother   . Colon cancer Neg Hx   . Esophageal cancer Neg Hx   .  Rectal cancer Neg Hx   . Stomach cancer Neg Hx   . Ovarian cancer Neg Hx   . Uterine cancer Neg Hx   . Breast cancer Neg Hx     Social History   Socioeconomic History  . Marital status: Single    Spouse name: Not on file  . Number of children: 2  . Years of education: 12+  . Highest education level: Not on file  Occupational History  . Occupation: Retired  Tobacco Use  . Smoking status: Current Every Day Smoker    Packs/day: 0.50    Years: 59.00    Pack years: 29.50    Types: Cigarettes  . Smokeless  tobacco: Never Used  . Tobacco comment: since age 18  Substance and Sexual Activity  . Alcohol use: Not Currently    Alcohol/week: 0.0 standard drinks    Comment: socially  . Drug use: Never  . Sexual activity: Not Currently    Birth control/protection: Post-menopausal, Surgical  Other Topics Concern  . Not on file  Social History Narrative   Lives at home with her mother.   Right-handed.   Several sodas per day.   Social Determinants of Health   Financial Resource Strain:   . Difficulty of Paying Living Expenses:   Food Insecurity:   . Worried About Charity fundraiser in the Last Year:   . Arboriculturist in the Last Year:   Transportation Needs:   . Film/video editor (Medical):   Marland Kitchen Lack of Transportation (Non-Medical):   Physical Activity:   . Days of Exercise per Week:   . Minutes of Exercise per Session:   Stress:   . Feeling of Stress :   Social Connections:   . Frequency of Communication with Friends and Family:   . Frequency of Social Gatherings with Friends and Family:   . Attends Religious Services:   . Active Member of Clubs or Organizations:   . Attends Archivist Meetings:   Marland Kitchen Marital Status:     Current Medications:  Current Outpatient Medications:  .  amLODipine (NORVASC) 10 MG tablet, Take 1 tablet (10 mg total) by mouth daily., Disp: 90 tablet, Rfl: 3 .  aspirin EC 81 MG tablet, Take 81 mg by mouth daily., Disp: , Rfl:  .  atorvastatin (LIPITOR) 20 MG tablet, Take 1 tablet (20 mg total) by mouth daily. (Patient taking differently: Take 20 mg by mouth at bedtime. ), Disp: 90 tablet, Rfl: 3 .  Carboxymethylcellul-Glycerin (REFRESH OPTIVE OP), Place 2 drops into both eyes every morning., Disp: , Rfl:  .  cholecalciferol (VITAMIN D3) 25 MCG (1000 UNIT) tablet, Take 1,000 Units by mouth daily., Disp: , Rfl:  .  ferrous sulfate 324 MG TBEC, Take 324 mg by mouth daily with breakfast. , Disp: , Rfl:  .  MAGNESIUM PO, Take 1 tablet by mouth  daily., Disp: , Rfl:  .  oxyCODONE (OXY IR/ROXICODONE) 5 MG immediate release tablet, Take 1 tablet (5 mg total) by mouth every 4 (four) hours as needed for severe pain. For AFTER surgery, do not take and drive, Disp: 10 tablet, Rfl: 0 .  senna-docusate (SENOKOT-S) 8.6-50 MG tablet, Take 2 tablets by mouth at bedtime. For AFTER surgery, do not take if having loose stools, Disp: 30 tablet, Rfl: 0 .  vitamin E 180 MG (400 UNITS) capsule, Take 400 Units by mouth daily., Disp: , Rfl:   Review of Systems: Denies appetite changes, fevers, chills, fatigue, unexplained  weight changes. Denies hearing loss, neck lumps or masses, mouth sores, ringing in ears or voice changes. Denies cough or wheezing.  Denies shortness of breath. Denies chest pain or palpitations. Denies leg swelling. Denies abdominal distention, pain, blood in stools, constipation, diarrhea, nausea, vomiting, or early satiety. Denies pain with intercourse, dysuria, frequency, hematuria or incontinence. Denies hot flashes, pelvic pain, vaginal bleeding or vaginal discharge.   Denies joint pain, back pain or muscle pain/cramps. Denies itching, rash, or wounds. Denies dizziness, headaches, numbness or seizures. Denies swollen lymph nodes or glands, denies easy bruising or bleeding. Denies anxiety, depression, confusion, or decreased concentration.  Physical Exam: There were no vitals taken for this visit. General: ***Alert, oriented, no acute distress. HEENT: ***Posterior oropharynx clear, sclera anicteric. Chest: ***Clear to auscultation bilaterally.  ***Port site clean. Cardiovascular: ***Regular rate and rhythm, no murmurs. Abdomen: ***Obese, soft, nontender.  Normoactive bowel sounds.  No masses or hepatosplenomegaly appreciated.  ***Well-healed scar. Extremities: ***Grossly normal range of motion.  Warm, well perfused.  No edema bilaterally. Skin: ***No rashes or lesions noted. Lymphatics: ***No cervical, supraclavicular, or  inguinal adenopathy. GU: Normal appearing external genitalia without erythema, excoriation, or lesions.  Speculum exam reveals ***.  Bimanual exam reveals ***.  ***Rectovaginal exam  confirms ___.  Laboratory & Radiologic Studies: None new  Assessment & Plan: ARDYS DHAWAN is a 74 y.o. woman with history of high-grade cervical dysplasia (on CKC with positive margins and inability to re-excise) who presents for post-op follow-up after TRH/BSO with final pathology revealing low-grade dysplasia.  ***  *** minutes of total time was spent for this patient encounter, including preparation, face-to-face counseling with the patient and coordination of care, and documentation of the encounter.  Jeral Pinch, MD  Division of Gynecologic Oncology  Department of Obstetrics and Gynecology  [facility]

## 2020-03-13 ENCOUNTER — Emergency Department (HOSPITAL_COMMUNITY): Payer: Medicare HMO

## 2020-03-13 ENCOUNTER — Encounter (HOSPITAL_COMMUNITY): Payer: Self-pay | Admitting: Internal Medicine

## 2020-03-13 ENCOUNTER — Inpatient Hospital Stay: Payer: Medicare HMO | Admitting: Gynecologic Oncology

## 2020-03-13 ENCOUNTER — Encounter (HOSPITAL_COMMUNITY)
Admission: EM | Disposition: A | Discharge status: Home / Self Care | Payer: Self-pay | Source: Home / Self Care | Attending: Internal Medicine

## 2020-03-13 ENCOUNTER — Inpatient Hospital Stay (HOSPITAL_COMMUNITY): Payer: Medicare HMO | Admitting: Anesthesiology

## 2020-03-13 ENCOUNTER — Inpatient Hospital Stay (HOSPITAL_COMMUNITY)
Admission: EM | Admit: 2020-03-13 | Discharge: 2020-03-16 | DRG: 378 | Disposition: A | Discharge status: Home / Self Care | Payer: Medicare HMO | Attending: Internal Medicine | Admitting: Internal Medicine

## 2020-03-13 DIAGNOSIS — K3182 Dieulafoy lesion (hemorrhagic) of stomach and duodenum: Principal | ICD-10-CM

## 2020-03-13 DIAGNOSIS — K222 Esophageal obstruction: Secondary | ICD-10-CM | POA: Diagnosis present

## 2020-03-13 DIAGNOSIS — E785 Hyperlipidemia, unspecified: Secondary | ICD-10-CM | POA: Diagnosis not present

## 2020-03-13 DIAGNOSIS — K219 Gastro-esophageal reflux disease without esophagitis: Secondary | ICD-10-CM | POA: Diagnosis not present

## 2020-03-13 DIAGNOSIS — D649 Anemia, unspecified: Secondary | ICD-10-CM | POA: Diagnosis not present

## 2020-03-13 DIAGNOSIS — N183 Chronic kidney disease, stage 3 unspecified: Secondary | ICD-10-CM | POA: Diagnosis not present

## 2020-03-13 DIAGNOSIS — R5381 Other malaise: Secondary | ICD-10-CM | POA: Diagnosis present

## 2020-03-13 DIAGNOSIS — I10 Essential (primary) hypertension: Secondary | ICD-10-CM | POA: Diagnosis not present

## 2020-03-13 DIAGNOSIS — E875 Hyperkalemia: Secondary | ICD-10-CM | POA: Diagnosis not present

## 2020-03-13 DIAGNOSIS — K922 Gastrointestinal hemorrhage, unspecified: Secondary | ICD-10-CM | POA: Diagnosis not present

## 2020-03-13 DIAGNOSIS — I4581 Long QT syndrome: Secondary | ICD-10-CM | POA: Diagnosis present

## 2020-03-13 DIAGNOSIS — R1111 Vomiting without nausea: Secondary | ICD-10-CM | POA: Diagnosis not present

## 2020-03-13 DIAGNOSIS — Z8261 Family history of arthritis: Secondary | ICD-10-CM

## 2020-03-13 DIAGNOSIS — Z79899 Other long term (current) drug therapy: Secondary | ICD-10-CM

## 2020-03-13 DIAGNOSIS — Z90710 Acquired absence of both cervix and uterus: Secondary | ICD-10-CM | POA: Diagnosis not present

## 2020-03-13 DIAGNOSIS — R0902 Hypoxemia: Secondary | ICD-10-CM | POA: Diagnosis not present

## 2020-03-13 DIAGNOSIS — R739 Hyperglycemia, unspecified: Secondary | ICD-10-CM | POA: Diagnosis present

## 2020-03-13 DIAGNOSIS — Z7982 Long term (current) use of aspirin: Secondary | ICD-10-CM

## 2020-03-13 DIAGNOSIS — R197 Diarrhea, unspecified: Secondary | ICD-10-CM | POA: Diagnosis present

## 2020-03-13 DIAGNOSIS — Z8541 Personal history of malignant neoplasm of cervix uteri: Secondary | ICD-10-CM

## 2020-03-13 DIAGNOSIS — I959 Hypotension, unspecified: Secondary | ICD-10-CM | POA: Diagnosis not present

## 2020-03-13 DIAGNOSIS — Z6823 Body mass index (BMI) 23.0-23.9, adult: Secondary | ICD-10-CM

## 2020-03-13 DIAGNOSIS — D62 Acute posthemorrhagic anemia: Secondary | ICD-10-CM

## 2020-03-13 DIAGNOSIS — Z20822 Contact with and (suspected) exposure to covid-19: Secondary | ICD-10-CM | POA: Diagnosis not present

## 2020-03-13 DIAGNOSIS — I129 Hypertensive chronic kidney disease with stage 1 through stage 4 chronic kidney disease, or unspecified chronic kidney disease: Secondary | ICD-10-CM | POA: Diagnosis present

## 2020-03-13 DIAGNOSIS — Z8249 Family history of ischemic heart disease and other diseases of the circulatory system: Secondary | ICD-10-CM

## 2020-03-13 DIAGNOSIS — E440 Moderate protein-calorie malnutrition: Secondary | ICD-10-CM | POA: Diagnosis not present

## 2020-03-13 DIAGNOSIS — K449 Diaphragmatic hernia without obstruction or gangrene: Secondary | ICD-10-CM | POA: Diagnosis present

## 2020-03-13 DIAGNOSIS — R000 Tachycardia, unspecified: Secondary | ICD-10-CM | POA: Diagnosis not present

## 2020-03-13 DIAGNOSIS — I70203 Unspecified atherosclerosis of native arteries of extremities, bilateral legs: Secondary | ICD-10-CM | POA: Diagnosis present

## 2020-03-13 DIAGNOSIS — Z86010 Personal history of colonic polyps: Secondary | ICD-10-CM

## 2020-03-13 DIAGNOSIS — D631 Anemia in chronic kidney disease: Secondary | ICD-10-CM | POA: Diagnosis not present

## 2020-03-13 DIAGNOSIS — K920 Hematemesis: Secondary | ICD-10-CM | POA: Diagnosis not present

## 2020-03-13 DIAGNOSIS — M47812 Spondylosis without myelopathy or radiculopathy, cervical region: Secondary | ICD-10-CM | POA: Diagnosis present

## 2020-03-13 DIAGNOSIS — N179 Acute kidney failure, unspecified: Secondary | ICD-10-CM | POA: Diagnosis not present

## 2020-03-13 DIAGNOSIS — Z8719 Personal history of other diseases of the digestive system: Secondary | ICD-10-CM

## 2020-03-13 DIAGNOSIS — F17210 Nicotine dependence, cigarettes, uncomplicated: Secondary | ICD-10-CM | POA: Diagnosis present

## 2020-03-13 DIAGNOSIS — N1831 Chronic kidney disease, stage 3a: Secondary | ICD-10-CM | POA: Diagnosis not present

## 2020-03-13 DIAGNOSIS — K6381 Dieulafoy lesion of intestine: Secondary | ICD-10-CM

## 2020-03-13 HISTORY — PX: HEMOSTASIS CLIP PLACEMENT: SHX6857

## 2020-03-13 HISTORY — PX: HOT HEMOSTASIS: SHX5433

## 2020-03-13 HISTORY — PX: SCLEROTHERAPY: SHX6841

## 2020-03-13 HISTORY — PX: ESOPHAGOGASTRODUODENOSCOPY (EGD) WITH PROPOFOL: SHX5813

## 2020-03-13 LAB — I-STAT CHEM 8, ED
BUN: 99 mg/dL — ABNORMAL HIGH (ref 8–23)
Calcium, Ion: 1.28 mmol/L (ref 1.15–1.40)
Chloride: 109 mmol/L (ref 98–111)
Creatinine, Ser: 2.9 mg/dL — ABNORMAL HIGH (ref 0.44–1.00)
Glucose, Bld: 187 mg/dL — ABNORMAL HIGH (ref 70–99)
HCT: 18 % — ABNORMAL LOW (ref 36.0–46.0)
Hemoglobin: 6.1 g/dL — CL (ref 12.0–15.0)
Potassium: 5.6 mmol/L — ABNORMAL HIGH (ref 3.5–5.1)
Sodium: 137 mmol/L (ref 135–145)
TCO2: 18 mmol/L — ABNORMAL LOW (ref 22–32)

## 2020-03-13 LAB — CBC WITH DIFFERENTIAL/PLATELET
Abs Immature Granulocytes: 0.04 10*3/uL (ref 0.00–0.07)
Basophils Absolute: 0 10*3/uL (ref 0.0–0.1)
Basophils Relative: 0 %
Eosinophils Absolute: 0 10*3/uL (ref 0.0–0.5)
Eosinophils Relative: 0 %
HCT: 30.3 % — ABNORMAL LOW (ref 36.0–46.0)
Hemoglobin: 9.6 g/dL — ABNORMAL LOW (ref 12.0–15.0)
Immature Granulocytes: 1 %
Lymphocytes Relative: 41 %
Lymphs Abs: 2.6 10*3/uL (ref 0.7–4.0)
MCH: 29.3 pg (ref 26.0–34.0)
MCHC: 31.7 g/dL (ref 30.0–36.0)
MCV: 92.4 fL (ref 80.0–100.0)
Monocytes Absolute: 0.4 10*3/uL (ref 0.1–1.0)
Monocytes Relative: 6 %
Neutro Abs: 3.3 10*3/uL (ref 1.7–7.7)
Neutrophils Relative %: 52 %
Platelets: 197 10*3/uL (ref 150–400)
RBC: 3.28 MIL/uL — ABNORMAL LOW (ref 3.87–5.11)
RDW: 18.3 % — ABNORMAL HIGH (ref 11.5–15.5)
WBC: 6.4 10*3/uL (ref 4.0–10.5)
nRBC: 0.8 % — ABNORMAL HIGH (ref 0.0–0.2)

## 2020-03-13 LAB — MRSA PCR SCREENING: MRSA by PCR: NEGATIVE

## 2020-03-13 LAB — CBC
HCT: 14.9 % — ABNORMAL LOW (ref 36.0–46.0)
HCT: 16.6 % — ABNORMAL LOW (ref 36.0–46.0)
Hemoglobin: 4.8 g/dL — CL (ref 12.0–15.0)
Hemoglobin: 5.3 g/dL — CL (ref 12.0–15.0)
MCH: 30 pg (ref 26.0–34.0)
MCH: 30.3 pg (ref 26.0–34.0)
MCHC: 32.2 g/dL (ref 30.0–36.0)
MCHC: 31.9 g/dL (ref 30.0–36.0)
MCV: 93.1 fL (ref 80.0–100.0)
MCV: 94.9 fL (ref 80.0–100.0)
Platelets: 196 10*3/uL (ref 150–400)
Platelets: 190 K/uL (ref 150–400)
RBC: 1.6 MIL/uL — ABNORMAL LOW (ref 3.87–5.11)
RBC: 1.75 MIL/uL — ABNORMAL LOW (ref 3.87–5.11)
RDW: 18.5 % — ABNORMAL HIGH (ref 11.5–15.5)
RDW: 18.6 % — ABNORMAL HIGH (ref 11.5–15.5)
WBC: 8.7 10*3/uL (ref 4.0–10.5)
WBC: 9.2 K/uL (ref 4.0–10.5)
nRBC: 0.3 % — ABNORMAL HIGH (ref 0.0–0.2)
nRBC: 0.3 % — ABNORMAL HIGH (ref 0.0–0.2)

## 2020-03-13 LAB — LACTIC ACID, PLASMA
Lactic Acid, Venous: 3.1 mmol/L (ref 0.5–1.9)
Lactic Acid, Venous: 3.9 mmol/L (ref 0.5–1.9)

## 2020-03-13 LAB — COMPREHENSIVE METABOLIC PANEL
ALT: 25 U/L (ref 0–44)
AST: 35 U/L (ref 15–41)
Albumin: 3.3 g/dL — ABNORMAL LOW (ref 3.5–5.0)
Alkaline Phosphatase: 54 U/L (ref 38–126)
Anion gap: 16 — ABNORMAL HIGH (ref 5–15)
BUN: 97 mg/dL — ABNORMAL HIGH (ref 8–23)
CO2: 16 mmol/L — ABNORMAL LOW (ref 22–32)
Calcium: 9.8 mg/dL (ref 8.9–10.3)
Chloride: 107 mmol/L (ref 98–111)
Creatinine, Ser: 2.68 mg/dL — ABNORMAL HIGH (ref 0.44–1.00)
GFR calc Af Amer: 20 mL/min — ABNORMAL LOW (ref >60)
GFR calc non Af Amer: 17 mL/min — ABNORMAL LOW (ref >60)
Glucose, Bld: 198 mg/dL — ABNORMAL HIGH (ref 70–99)
Potassium: 5.7 mmol/L — ABNORMAL HIGH (ref 3.5–5.1)
Sodium: 139 mmol/L (ref 135–145)
Total Bilirubin: 0.5 mg/dL (ref 0.3–1.2)
Total Protein: 7 g/dL (ref 6.5–8.1)

## 2020-03-13 LAB — POTASSIUM: Potassium: 5.8 mmol/L — ABNORMAL HIGH (ref 3.5–5.1)

## 2020-03-13 LAB — POC SARS CORONAVIRUS 2 AG -  ED: SARS Coronavirus 2 Ag: NEGATIVE

## 2020-03-13 LAB — GLUCOSE, CAPILLARY
Glucose-Capillary: 179 mg/dL — ABNORMAL HIGH (ref 70–99)
Glucose-Capillary: 98 mg/dL (ref 70–99)

## 2020-03-13 LAB — PROTIME-INR
INR: 1.2 (ref 0.8–1.2)
Prothrombin Time: 14.7 seconds (ref 11.4–15.2)

## 2020-03-13 LAB — HEMOGLOBIN AND HEMATOCRIT, BLOOD
HCT: 30.1 % — ABNORMAL LOW (ref 36.0–46.0)
Hemoglobin: 9.9 g/dL — ABNORMAL LOW (ref 12.0–15.0)

## 2020-03-13 LAB — HEMOGLOBIN A1C
Hgb A1c MFr Bld: 4.3 % — ABNORMAL LOW (ref 4.8–5.6)
Mean Plasma Glucose: 76.71 mg/dL

## 2020-03-13 LAB — RESPIRATORY PANEL BY RT PCR (FLU A&B, COVID)
Influenza A by PCR: NEGATIVE
Influenza B by PCR: NEGATIVE
SARS Coronavirus 2 by RT PCR: NEGATIVE

## 2020-03-13 LAB — PREPARE RBC (CROSSMATCH)

## 2020-03-13 SURGERY — ESOPHAGOGASTRODUODENOSCOPY (EGD) WITH PROPOFOL
Anesthesia: Monitor Anesthesia Care

## 2020-03-13 MED ORDER — ONDANSETRON HCL 4 MG/2ML IJ SOLN
INTRAMUSCULAR | Status: DC | PRN
  Administered 2020-03-13: 4 mg via INTRAVENOUS

## 2020-03-13 MED ORDER — ACETAMINOPHEN 325 MG PO TABS: 650.0000 mg | ORAL_TABLET | Freq: Four times a day (QID) | ORAL | Status: DC | PRN

## 2020-03-13 MED ORDER — ALBUMIN HUMAN 5 % IV SOLN
INTRAVENOUS | Status: AC
  Filled 2020-03-13: qty 500

## 2020-03-13 MED ORDER — ATORVASTATIN CALCIUM 20 MG PO TABS
20.0000 mg | ORAL_TABLET | Freq: Every day | ORAL | Status: DC
  Administered 2020-03-14 – 2020-03-15 (×2): 20 mg via ORAL
  Filled 2020-03-13 (×3): qty 1
  Filled 2020-03-13: qty 2

## 2020-03-13 MED ORDER — SODIUM ZIRCONIUM CYCLOSILICATE 10 G PO PACK
10.0000 g | PACK | Freq: Once | ORAL | Status: AC
  Administered 2020-03-14: 06:00:00 10 g via ORAL
  Filled 2020-03-13: qty 1

## 2020-03-13 MED ORDER — LACTATED RINGERS IV BOLUS
1000.0000 mL | Freq: Once | INTRAVENOUS | Status: AC
  Administered 2020-03-13: 1000 mL via INTRAVENOUS

## 2020-03-13 MED ORDER — INSULIN ASPART 100 UNIT/ML ~~LOC~~ SOLN
0.0000 [IU] | SUBCUTANEOUS | Status: DC
  Administered 2020-03-13: 2 [IU] via SUBCUTANEOUS
  Administered 2020-03-14 (×2): 1 [IU] via SUBCUTANEOUS
  Filled 2020-03-13: qty 0.09

## 2020-03-13 MED ORDER — CHLORHEXIDINE GLUCONATE 0.12 % MT SOLN
15.0000 mL | Freq: Two times a day (BID) | OROMUCOSAL | Status: DC
  Administered 2020-03-13 – 2020-03-15 (×5): 15 mL via OROMUCOSAL
  Filled 2020-03-13 (×4): qty 15

## 2020-03-13 MED ORDER — ORAL CARE MOUTH RINSE
15.0000 mL | Freq: Two times a day (BID) | OROMUCOSAL | Status: DC
  Administered 2020-03-14 – 2020-03-15 (×3): 15 mL via OROMUCOSAL

## 2020-03-13 MED ORDER — PANTOPRAZOLE SODIUM 40 MG IV SOLR
40.0000 mg | Freq: Once | INTRAVENOUS | Status: AC
  Administered 2020-03-13: 40 mg via INTRAVENOUS
  Filled 2020-03-13: qty 40

## 2020-03-13 MED ORDER — HYDROMORPHONE HCL 1 MG/ML IJ SOLN
INTRAMUSCULAR | Status: AC
  Filled 2020-03-13: qty 1

## 2020-03-13 MED ORDER — PROPOFOL 500 MG/50ML IV EMUL
INTRAVENOUS | Status: DC | PRN
  Administered 2020-03-13: 75 ug/kg/min via INTRAVENOUS

## 2020-03-13 MED ORDER — STERILE WATER FOR INJECTION IJ SOLN
INTRAMUSCULAR | Status: AC
  Filled 2020-03-13: qty 10

## 2020-03-13 MED ORDER — ORAL CARE MOUTH RINSE: 15.0000 mL | Freq: Two times a day (BID) | OROMUCOSAL | Status: DC

## 2020-03-13 MED ORDER — OXYCODONE HCL 5 MG PO TABS: 5.0000 mg | ORAL_TABLET | Freq: Once | ORAL | Status: DC | PRN

## 2020-03-13 MED ORDER — POLYVINYL ALCOHOL 1.4 % OP SOLN: 1.0000 [drp] | OPHTHALMIC | Status: DC | PRN

## 2020-03-13 MED ORDER — PROMETHAZINE HCL 25 MG/ML IJ SOLN: 6.2500 mg | INTRAMUSCULAR | Status: DC | PRN

## 2020-03-13 MED ORDER — SODIUM CHLORIDE 0.9 % IV SOLN
8.0000 mg/h | INTRAVENOUS | Status: DC
  Administered 2020-03-13 – 2020-03-15 (×3): 8 mg/h via INTRAVENOUS
  Filled 2020-03-13 (×4): qty 80

## 2020-03-13 MED ORDER — SODIUM CHLORIDE (PF) 0.9 % IJ SOLN
PREFILLED_SYRINGE | INTRAMUSCULAR | Status: DC | PRN
  Administered 2020-03-13: 2 mL
  Administered 2020-03-13: 3 mL
  Administered 2020-03-13: 5 mL
  Administered 2020-03-13: 17:00:00 2 mL

## 2020-03-13 MED ORDER — LACTATED RINGERS IV SOLN: INTRAVENOUS | Status: DC

## 2020-03-13 MED ORDER — LACTATED RINGERS IV BOLUS
500.0000 mL | Freq: Once | INTRAVENOUS | Status: AC
  Administered 2020-03-13: 500 mL via INTRAVENOUS

## 2020-03-13 MED ORDER — HYDROMORPHONE HCL 1 MG/ML IJ SOLN
0.2500 mg | INTRAMUSCULAR | Status: DC | PRN
  Administered 2020-03-13: 18:00:00 0.5 mg via INTRAVENOUS

## 2020-03-13 MED ORDER — OXYCODONE HCL 5 MG/5ML PO SOLN: 5.0000 mg | Freq: Once | ORAL | Status: DC | PRN

## 2020-03-13 MED ORDER — VITAMIN D 25 MCG (1000 UNIT) PO TABS
1000.0000 [IU] | ORAL_TABLET | Freq: Every day | ORAL | Status: DC
  Administered 2020-03-14 – 2020-03-15 (×2): 1000 [IU] via ORAL
  Filled 2020-03-13 (×3): qty 1

## 2020-03-13 MED ORDER — PHENYLEPHRINE HCL (PRESSORS) 10 MG/ML IV SOLN
INTRAVENOUS | Status: DC | PRN
  Administered 2020-03-13: 160 ug via INTRAVENOUS
  Administered 2020-03-13: 80 ug via INTRAVENOUS
  Administered 2020-03-13: 120 ug via INTRAVENOUS
  Administered 2020-03-13: 160 ug via INTRAVENOUS

## 2020-03-13 MED ORDER — ONDANSETRON HCL 4 MG/2ML IJ SOLN
4.0000 mg | Freq: Once | INTRAMUSCULAR | Status: AC
  Administered 2020-03-13: 4 mg via INTRAVENOUS
  Filled 2020-03-13: qty 2

## 2020-03-13 MED ORDER — PROCHLORPERAZINE EDISYLATE 10 MG/2ML IJ SOLN
5.0000 mg | INTRAMUSCULAR | Status: DC | PRN
  Administered 2020-03-13: 5 mg via INTRAVENOUS
  Filled 2020-03-13: qty 2

## 2020-03-13 MED ORDER — PANTOPRAZOLE SODIUM 40 MG IV SOLR: 40.0000 mg | Freq: Two times a day (BID) | INTRAVENOUS | Status: DC

## 2020-03-13 MED ORDER — SODIUM CHLORIDE 0.9 % IV SOLN: 10.0000 mL/h | Freq: Once | INTRAVENOUS | Status: DC

## 2020-03-13 MED ORDER — CHLORHEXIDINE GLUCONATE CLOTH 2 % EX PADS
6.0000 | MEDICATED_PAD | Freq: Every day | CUTANEOUS | Status: DC
  Administered 2020-03-13 – 2020-03-15 (×3): 6 via TOPICAL

## 2020-03-13 MED ORDER — SODIUM CHLORIDE 0.9 % IV BOLUS
1000.0000 mL | Freq: Once | INTRAVENOUS | Status: AC
  Administered 2020-03-13: 1000 mL via INTRAVENOUS

## 2020-03-13 MED ORDER — SODIUM CHLORIDE 0.9 % IV SOLN
INTRAVENOUS | Status: AC | PRN
  Administered 2020-03-13: 500 mL via INTRAMUSCULAR

## 2020-03-13 SURGICAL SUPPLY — 15 items

## 2020-03-13 NOTE — ED Notes (Signed)
Phlebotomy called for lab draw at this time.

## 2020-03-13 NOTE — ED Notes (Signed)
Chrys Racer, RN (ED) called Caryl Pina, RN (stepdown) and gave report. Patient is good to go to the floor from endoscopy. Chrys Racer, RN attempted to call endoscopy at this time to let them know.

## 2020-03-13 NOTE — Anesthesia Procedure Notes (Signed)
Procedure Name: Intubation Performed by: Gean Maidens, CRNA Pre-anesthesia Checklist: Patient identified, Emergency Drugs available, Suction available, Patient being monitored and Timeout performed Patient Re-evaluated:Patient Re-evaluated prior to induction Oxygen Delivery Method: Circle system utilized Preoxygenation: Pre-oxygenation with 100% oxygen Induction Type: IV induction Ventilation: Mask ventilation without difficulty Laryngoscope Size: Mac and 4 Grade View: Grade II Tube type: Oral Tube size: 7.0 mm Number of attempts: 1 Airway Equipment and Method: Stylet Placement Confirmation: ETT inserted through vocal cords under direct vision,  positive ETCO2 and breath sounds checked- equal and bilateral Secured at: 23 cm Tube secured with: Tape Dental Injury: Teeth and Oropharynx as per pre-operative assessment

## 2020-03-13 NOTE — Anesthesia Postprocedure Evaluation (Signed)
Anesthesia Post Note  Patient: LEILAHNI REXFORD  Procedure(s) Performed: ESOPHAGOGASTRODUODENOSCOPY (EGD) WITH PROPOFOL (N/A ) HOT HEMOSTASIS (ARGON PLASMA COAGULATION/BICAP) (N/A ) SCLEROTHERAPY HEMOSTASIS CLIP PLACEMENT     Patient location during evaluation: PACU Anesthesia Type: General Level of consciousness: awake and alert Pain management: pain level controlled Vital Signs Assessment: post-procedure vital signs reviewed and stable Respiratory status: spontaneous breathing, nonlabored ventilation and respiratory function stable Cardiovascular status: blood pressure returned to baseline and stable Postop Assessment: no apparent nausea or vomiting Anesthetic complications: no    Last Vitals:  Vitals:   03/13/20 1508 03/13/20 1750  BP: 102/87 (!) 181/66  Pulse: 96 87  Resp: 20 (!) 22  Temp: 37.1 C   SpO2: 98% 93%    Last Pain:  Vitals:   03/13/20 1750  TempSrc:   PainSc: Asleep                 Lynda Rainwater
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
## 2020-03-13 NOTE — ED Notes (Signed)
Date and time results received: 03/13/20 10:14 AM  (use smartphrase ".now" to insert current time)  Test: lactic Critical Value: 3.1  Name of Provider Notified: Regenia Skeeter   Orders Received? Or Actions Taken?: see chart

## 2020-03-13 NOTE — ED Triage Notes (Signed)
From home via EMS Dark red clots, vomiting blood started last night 8PM Denies abdominal pain currently but had cramping last PM No sob, chest pain Has not eaten since yesterday AM   90/50 BP 105 HR 18 RR 100% RA 176 CBG   AOx4

## 2020-03-13 NOTE — Transfer of Care (Signed)
Immediate Anesthesia Transfer of Care Note  Patient: JAZZY HOLDERBAUM  Procedure(s) Performed: ESOPHAGOGASTRODUODENOSCOPY (EGD) WITH PROPOFOL (N/A ) HOT HEMOSTASIS (ARGON PLASMA COAGULATION/BICAP) (N/A ) SCLEROTHERAPY HEMOSTASIS CLIP PLACEMENT  Patient Location: PACU  Anesthesia Type:General  Level of Consciousness: awake, alert , patient cooperative and responds to stimulation  Airway & Oxygen Therapy: Patient Spontanous Breathing and Patient connected to face mask oxygen  Post-op Assessment: Report given to RN and Post -op Vital signs reviewed and stable  Post vital signs: Reviewed and stable  Last Vitals:  Vitals Value Taken Time  BP    Temp    Pulse    Resp    SpO2      Last Pain:  Vitals:   03/13/20 1508  TempSrc: Oral  PainSc:          Complications: No apparent anesthesia complications
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
## 2020-03-13 NOTE — ED Notes (Signed)
Date and time results received: 03/13/20 10:03 AM  (use smartphrase ".now" to insert current time)  Test: hgb Critical Value: 4.8  Name of Provider Notified: Regenia Skeeter, MD   Orders Received? Or Actions Taken?:  See chart

## 2020-03-13 NOTE — Progress Notes (Signed)
Pt arrived from PACU, Alert to self, place and situation, disoriented to time, very drowsy, speech clear. Daughter Eulis Canner at bedside. 2 PIV in place. Placed on 3L [place], VSS. Strong, non-productive cough noted. CHG bath completed. Skin non remarkable, small scab noted to upper left abdomen, foam pad placed. Purewick placed.   Started protonix gtt upon arrival.

## 2020-03-13 NOTE — ED Provider Notes (Signed)
[facility] DEPT Provider Note   CSN: [id number] Arrival date & time: 03/13/20  [id number]     History No chief complaint on file.   DANDRIA PUENTE is a 74 y.o. female.  HPI 74 year old female presents with vomiting.  Started last night.  The first 3 episodes of vomiting were mostly regurgitated food and then eventually started having blood.  Describes as lots and most of the emesis.  Some abdominal cramping.  About 3 episodes of diarrhea.  Denies any fever.  Earlier this month had a hysterectomy by Dr. Berline Lopes. Feels weak. Hypotensive with EMS, and they couldn't get an IV.   Past Medical History:  Diagnosis Date  . Arthritis   . Atherosclerosis of native artery of both lower extremities with intermittent claudication [facility])    vascular--- dr Trula Slade--- last ABIs 07-11-2018 in epic  . Cervical cancer (HCC)    squamous cell carcinoma   . Cervical spondylosis   . Chest pain 12-24-2019  per pt no chest pain since approx. 12-05-2019, pt stated only had chest pain (no nausea, palpitations, irregular heartbeat, sob, difficulty breathing, sweating , or peripheral swelling)   per pt pcp note 11-28-2019, Dr Pricilla Holm, pt complaint with chest pain, Dr Sharlet Salina noted changes on her EKG done that day and with pt at high risk pt referred to cardiology/  pt was seen by Dr Gwenlyn Found 12-18-2019, note in epic, pt denied chest pain at this visit,  Dr Gwenlyn Found ordered carotid dopplers and echo and pt has appointment's scheduled  . Chronic neck and back pain    midline thoracic back pain  . CKD (chronic kidney disease), stage III   . Dry eyes   . Full dentures   . GERD (gastroesophageal reflux disease)    12-24-2019 per pt occasionally ,  drinks water  . Heart murmur    per Dr Gwenlyn Found assessment/ note on 12-18-2019  . History of cervical dysplasia    per pt at age 30s had cervix "scraped"  . History of trichomonal vaginitis    12-04-2019  per pap smear,  treated  . Hx of  adenomatous polyp of colon 03/06/2015  . Hyperlipidemia   . Hypertension    followed by pcp   (12-24-2019 per pt never had a stress test)  . IDA (iron deficiency anemia)   . Postmenopausal bleeding   . Spondylolisthesis of cervicothoracic region    steroid injection 08-23-2019    Patient Active Problem List   Diagnosis Date Noted  . Hematemesis 03/13/2020  . Oxygen desaturation 02/20/2020  . Cervical dysplasia 12/19/2019  . Vulvar atrophy 12/10/2019  . Postmenopausal bleeding 12/06/2019  . Trichomoniasis 12/06/2019  . Hypertension 11/28/2019  . Breast pain 11/28/2019  . Abscess of back 10/30/2019  . Cervicalgia 08/07/2019  . Lipoma 06/29/2018  . Claudication ([place]) 06/29/2018  . Neck pain 06/29/2018  . Tobacco abuse 09/03/2016  . Shoulder pain 12/23/2015  . Hx of adenomatous polyp of colon 03/06/2015  . Back pain 12/06/2014  . Routine general medical examination at a health care facility 12/06/2014    Past Surgical History:  Procedure Laterality Date  . CATARACT EXTRACTION W/ INTRAOCULAR LENS IMPLANT Right 2018  . CERVICAL CONIZATION W/BX N/A 12/27/2019   Procedure: COLD KNIFE CONIZATION CERVIX WITH BIOPSY;  Surgeon: Lafonda Mosses, MD;  Location: [facility];  Service: Gynecology;  Laterality: N/A;  . COLONOSCOPY  02/2015  . DILATION AND CURETTAGE OF UTERUS N/A 12/27/2019   Procedure: ENDOCERVICAL CURETTAGE;  Surgeon: Lafonda Mosses, MD;  Location: [facility];  Service: Gynecology;  Laterality: N/A;  . HEMORRHOID SURGERY  1970s  . ORIF METATARSAL FRACTURE Left 06-04-2011   dr hewitt  @[facility]    ORIF 3rd and 5th metatorsal shaft fractures/  closed treatment of 1st, 2nd, and 4th metatorsal fractures  . ROBOTIC ASSISTED TOTAL HYSTERECTOMY WITH BILATERAL SALPINGO OOPHERECTOMY Bilateral 02/19/2020   Procedure: XI ROBOTIC ASSISTED TOTAL HYSTERECTOMY WITH BILATERAL SALPINGO OOPHORECTOMY;  Surgeon: Lafonda Mosses, MD;  Location: [facility];  Service:  Gynecology;  Laterality: Bilateral;  . TUBAL LIGATION  yrs ago     OB History    Gravida  2   Para      Term      Preterm      AB      Living  2     SAB      TAB      Ectopic      Multiple      Live Births  2        Obstetric Comments  Vag delivery x 2        Family History  Problem Relation Age of Onset  . Hypertension Mother   . Arthritis Mother   . Colon cancer Neg Hx   . Esophageal cancer Neg Hx   . Rectal cancer Neg Hx   . Stomach cancer Neg Hx   . Ovarian cancer Neg Hx   . Uterine cancer Neg Hx   . Breast cancer Neg Hx     Social History   Tobacco Use  . Smoking status: Current Every Day Smoker    Packs/day: 0.50    Years: 59.00    Pack years: 29.50    Types: Cigarettes  . Smokeless tobacco: Never Used  . Tobacco comment: since age 52  Substance Use Topics  . Alcohol use: Not Currently    Alcohol/week: 0.0 standard drinks    Comment: socially  . Drug use: Never    Home Medications Prior to Admission medications   Medication Sig Start Date End Date Taking? Authorizing Provider  amLODipine (NORVASC) 10 MG tablet Take 1 tablet (10 mg total) by mouth daily. 11/28/19  Yes [name], MD  aspirin EC 81 MG tablet Take 81 mg by mouth daily.   Yes [provider]  atorvastatin (LIPITOR) 20 MG tablet Take 1 tablet (20 mg total) by mouth daily. Patient taking differently: Take 20 mg by mouth at bedtime.  12/18/19 03/17/20 Yes [name], MD  cholecalciferol (VITAMIN D3) 25 MCG (1000 UNIT) tablet Take 1,000 Units by mouth daily.   Yes [provider]  polyvinyl alcohol (LIQUIFILM TEARS) 1.4 % ophthalmic solution Place 1 drop into both eyes as needed for dry eyes.   Yes [provider]  vitamin E 180 MG (400 UNITS) capsule Take 400 Units by mouth daily.   Yes [provider]  oxyCODONE (OXY IR/ROXICODONE) 5 MG immediate release tablet Take 1 tablet (5 mg total) by mouth every 4 (four) hours as  needed for severe pain. For AFTER surgery, do not take and drive Patient not taking: Reported on 03/13/2020 01/24/20   Joylene John D, NP  senna-docusate (SENOKOT-S) 8.6-50 MG tablet Take 2 tablets by mouth at bedtime. For AFTER surgery, do not take if having loose stools Patient not taking: Reported on 03/13/2020 01/24/20   Joylene John D, NP    Allergies    Patient has no known allergies.  Review  of Systems   Review of Systems  Constitutional: Negative for fever.  Gastrointestinal: Positive for abdominal pain, diarrhea and vomiting. Negative for blood in stool.  Neurological: Positive for weakness.  All other systems reviewed and are negative.   Physical Exam Updated Vital Signs BP (!) 107/56   Pulse 90   Temp 97.6 F (36.4 C) (Oral)   Resp 20   SpO2 98%   Physical Exam Vitals and nursing note reviewed.  Constitutional:      General: She is not in acute distress.    Appearance: She is well-developed. She is not ill-appearing or diaphoretic.  HENT:     Head: Normocephalic and atraumatic.     Right Ear: External ear normal.     Left Ear: External ear normal.     Nose: Nose normal.  Eyes:     General:        Right eye: No discharge.        Left eye: No discharge.  Cardiovascular:     Rate and Rhythm: Normal rate and regular rhythm.     Heart sounds: Normal heart sounds.  Pulmonary:     Effort: Pulmonary effort is normal.     Breath sounds: Normal breath sounds.  Abdominal:     Palpations: Abdomen is soft.     Tenderness: There is abdominal tenderness in the periumbilical area.  Skin:    General: Skin is warm and dry.  Neurological:     Mental Status: She is alert.  Psychiatric:        Mood and Affect: Mood is not anxious.     ED Results / Procedures / Treatments   Labs (all labs ordered are listed, but only abnormal results are displayed) Labs Reviewed  CBC WITH DIFFERENTIAL/PLATELET - Abnormal; Notable for the following components:      Result Value   RBC  3.28 (*)    Hemoglobin 9.6 (*)    HCT 30.3 (*)    RDW 18.3 (*)    nRBC 0.8 (*)    All other components within normal limits  COMPREHENSIVE METABOLIC PANEL - Abnormal; Notable for the following components:   Potassium 5.7 (*)    CO2 16 (*)    Glucose, Bld 198 (*)    BUN 97 (*)    Creatinine, Ser 2.68 (*)    Albumin 3.3 (*)    GFR calc non Af Amer 17 (*)    GFR calc Af Amer 20 (*)    Anion gap 16 (*)    All other components within normal limits  CBC - Abnormal; Notable for the following components:   RBC 1.60 (*)    Hemoglobin 4.8 (*)    HCT 14.9 (*)    RDW 18.5 (*)    nRBC 0.3 (*)    All other components within normal limits  LACTIC ACID, PLASMA - Abnormal; Notable for the following components:   Lactic Acid, Venous 3.1 (*)    All other components within normal limits  LACTIC ACID, PLASMA - Abnormal; Notable for the following components:   Lactic Acid, Venous 3.9 (*)    All other components within normal limits  CBC - Abnormal; Notable for the following components:   RBC 1.75 (*)    Hemoglobin 5.3 (*)    HCT 16.6 (*)    RDW 18.6 (*)    nRBC 0.3 (*)    All other components within normal limits  I-STAT CHEM 8, ED - Abnormal; Notable for the following components:  Potassium 5.6 (*)    BUN 99 (*)    Creatinine, Ser 2.90 (*)    Glucose, Bld 187 (*)    TCO2 18 (*)    Hemoglobin 6.1 (*)    HCT 18.0 (*)    All other components within normal limits  PROTIME-INR  POC SARS CORONAVIRUS 2 AG -  ED  TYPE AND SCREEN  PREPARE RBC (CROSSMATCH)    EKG EKG Interpretation  Date/Time:  Thursday March 13 2020 09:30:36 EDT Ventricular Rate:  85 PR Interval:    QRS Duration: 83 QT Interval:  438 QTC Calculation: 521 R Axis:   58 Text Interpretation: Sinus rhythm Borderline ST depression, lateral leads Prolonged QT interval Confirmed by Sherwood Gambler [phone]) on 03/13/2020 9:38:26 AM   Radiology CT ABDOMEN PELVIS WO CONTRAST  Result Date: 03/13/2020 CLINICAL DATA:  Nausea  vomiting. Vomiting blood. EXAM: CT ABDOMEN AND PELVIS WITHOUT CONTRAST TECHNIQUE: Multidetector CT imaging of the abdomen and pelvis was performed following the standard protocol without IV contrast. COMPARISON:  None FINDINGS: Lower chest: No consolidation or pleural effusion. Signs of mitral annular calcification and right coronary artery calcification. Heart is incompletely imaged. Hepatobiliary: Liver without focal lesion. Sludge in the gallbladder lumen. Pancreas: 7 mm area of low attenuation in the pancreatic head best seen on coronal image 38 of series 4, not clearly cystic. No ductal dilation or peripancreatic inflammation. Spleen: Spleen is normal size without focal lesion. Adrenals/Urinary Tract: Left adrenal lesion 2.4 x 1.9 cm. Thickening of the right adrenal. The areas show very low density as low as -19 Hounsfield units. Hemorrhagic cysts in the left kidney. Renal cortical scarring bilaterally. Low-attenuation lesion in the interpolar right kidney approximately 1 cm likely a cyst. Small calculus in the upper pole the right kidney 2-3 mm. No hydronephrosis. Urinary bladder is under distended limiting assessment. Stomach/Bowel: Gastrointestinal tract without signs of acute abnormality. Normal appendix. Vascular/Lymphatic: Calcified atheromatous plaque throughout the abdominal aorta. Maximum caliber in the infrarenal segment approximately 2.2 cm. No adenopathy in the retroperitoneum. No pelvic lymphadenopathy. Reproductive: Post hysterectomy. Subtle area of added density along the anterior margin of the psoas appears to be contiguous with the ovarian vein on the right. Margins are slightly ill-defined. Other: There free air. No pneumatosis. Musculoskeletal: Spinal degenerative changes without acute or destructive bone process. IMPRESSION: No acute finding in the abdomen or pelvis. Bilateral adrenal thickening with more focal appearance on the left favored to represent asymmetric adrenal hyperplasia.  Adrenal adenoma with lipid rich characteristics also considered. Biochemical correlation may be helpful. Low attenuation in the pancreatic head not meeting criteria for cyst, follow-up pancreatic protocol is suggested; there are no acute findings related to the pancreas. Postoperative changes versus small ovarian remnant on the right anterior to the right psoas muscle. Correlate with surgical history. Follow-up imaging could be performed in 3-6 months as warranted. These results were called by telephone at the time of interpretation on 03/13/2020 at 9:49 am to provider Dr. Roslynn Amble, who verbally acknowledged these results. Aortic Atherosclerosis (ICD10-I70.0). Electronically Signed   By: Zetta Bills M.D.   On: 03/13/2020 09:50    Procedures Procedures (including critical care time)  CRITICAL CARE Performed by: Ephraim Hamburger   Total critical care time: 40 minutes  Critical care time was exclusive of separately billable procedures and treating other patients.  Critical care was necessary to treat or prevent imminent or life-threatening deterioration.  Critical care was time spent personally by me on the following activities: development of treatment plan  with patient and/or surrogate as well as nursing, discussions with consultants, evaluation of patient's response to treatment, examination of patient, obtaining history from patient or surrogate, ordering and performing treatments and interventions, ordering and review of laboratory studies, ordering and review of radiographic studies, pulse oximetry and re-evaluation of patient's condition.   Medications Ordered in ED Medications  0.9 %  sodium chloride infusion (has no administration in time range)  sodium chloride 0.9 % bolus 1,000 mL (0 mLs Intravenous Stopped 03/13/20 1139)  ondansetron (ZOFRAN) injection 4 mg (4 mg Intravenous Given 03/13/20 0837)  lactated ringers bolus 1,000 mL (1,000 mLs Intravenous New Bag/Given 03/13/20 0938)    pantoprazole (PROTONIX) injection 40 mg (40 mg Intravenous Given 03/13/20 0937)  sterile water (preservative free) injection (  Given 03/13/20 1119)    ED Course  I have reviewed the triage vital signs and the nursing notes.  Pertinent labs & imaging results that were available during my care of the patient were reviewed by me and considered in my medical decision making (see chart for details).    MDM Rules/Calculators/A&P                      74 year old female with acute upper GI bleed. No hematemesis/vomiting here, but has new anemia. BP has improved with fluids.  There was a discrepancy in the initial hemoglobin as the CBC showed 9 but the Chem-8 showed 6.1.  This was repeated with a hemoglobin of 4 which did not seem likely and was pulled from the IV so he was straight stock and found to be 5.3.  Has remained hemodynamically stable since initial fluid resuscitation.  CT without obvious findings.  Will transfuse 2 units of blood.  She has an elevated lactate but I think sepsis is unlikely and this is likely dehydration and anemia.  Discussed with Dr. Nevada Crane for admission.  Discussed with Cottie Banda of GI, they will consult. Probably EGD tomorrow but requests rapid covid PCR in case she decompensates. She is overall ill and will at least need stepdown admission.   Final Clinical Impression(s) / ED Diagnoses Final diagnoses:  Acute upper GI bleed  Symptomatic anemia  Acute kidney injury [facility])    Rx / DC Orders ED Discharge Orders    None       Sherwood Gambler, MD 03/13/20 1201

## 2020-03-13 NOTE — Anesthesia Preprocedure Evaluation (Signed)
Anesthesia Evaluation  Patient identified by MRN, date of birth, ID band Patient awake    Reviewed: Allergy & Precautions, NPO status , Patient's Chart, lab work & pertinent test results  History of Anesthesia Complications Negative for: history of anesthetic complications  Airway Mallampati: II  TM Distance: >3 FB Neck ROM: Full    Dental no notable dental hx. (+) Edentulous Upper, Edentulous Lower   Pulmonary Current Smoker and Patient abstained from smoking.,    Pulmonary exam normal breath sounds clear to auscultation       Cardiovascular hypertension, Pt. on medications + Peripheral Vascular Disease  Normal cardiovascular exam Rhythm:Regular Rate:Normal     Neuro/Psych negative neurological ROS  negative psych ROS   GI/Hepatic Neg liver ROS, GERD  ,  Endo/Other  negative endocrine ROS  Renal/GU Renal disease (CKDIII)  negative genitourinary   Musculoskeletal  (+) Arthritis , Osteoarthritis,    Abdominal   Peds  Hematology  (+) anemia ,   Anesthesia Other Findings  Echo 12/26/19: EF 70-75%, gr 1 dd, normal valves  Cleared by cardiology 01/31/2020.  Per Coletta Memos, NP, "Given past medical history and time since last visit, based on ACC/AHA guidelines,Onnika J Williamswould be at acceptable risk for the planned procedure without further cardiovascular testing.  Her RCRI is class II risk, 0.9% risk of major cardiac event."  Reproductive/Obstetrics                             Anesthesia Physical  Anesthesia Plan  ASA: III  Anesthesia Plan: MAC   Post-op Pain Management:    Induction: Intravenous  PONV Risk Score and Plan: 1 and Ondansetron and Treatment may vary due to age or medical condition  Airway Management Planned: Nasal Cannula  Additional Equipment: None  Intra-op Plan:   Post-operative Plan:   Informed Consent: I have reviewed the patients History and  Physical, chart, labs and discussed the procedure including the risks, benefits and alternatives for the proposed anesthesia with the patient or authorized representative who has indicated his/her understanding and acceptance.     Dental advisory given  Plan Discussed with:   Anesthesia Plan Comments:         Anesthesia Quick Evaluation

## 2020-03-13 NOTE — ED Notes (Signed)
Patient in CT at this time.

## 2020-03-13 NOTE — ED Notes (Signed)
Patient being taken to endoscopy at this time.

## 2020-03-13 NOTE — Op Note (Signed)
[facility] Patient Name: Anita [name] Procedure Date: 03/13/2020 MRN: [id number] Attending MD: Docia Chuck. Henrene Pastor , MD Date of Birth: [birth year] CSN: [id number] Age: 74 Admit Type: Outpatient Procedure:                Upper GI endoscopy Indications:              Acute post hemorrhagic anemia, Coffee-ground emesis Providers:                Docia Chuck. Henrene Pastor, MD, Benay Pillow, RN, Janeece Agee,                            Technician Referring MD:              Medicines:                Monitored Anesthesia Care Complications:            No immediate complications. Estimated Blood Loss:     Estimated blood loss: none. Procedure:                Pre-Anesthesia Assessment:                           - Prior to the procedure, a History and Physical                            was performed, and patient medications and                            allergies were reviewed. The patient's tolerance of                            previous anesthesia was also reviewed. The risks                            and benefits of the procedure and the sedation                            options and risks were discussed with the patient.                            All questions were answered, and informed consent                            was obtained. Prior Anticoagulants: The patient has                            taken no previous anticoagulant or antiplatelet                            agents. ASA Grade Assessment: II - A patient with                            mild systemic disease. After reviewing the risks  and benefits, the patient was deemed in                            satisfactory condition to undergo the procedure.                           After obtaining informed consent, the endoscope was                            passed under direct vision. Throughout the                            procedure, the patient's blood pressure, pulse, and                             oxygen saturations were monitored continuously. The                            [id number] Olympus gastroscope was                            introduced through the mouth, and advanced to the                            second part of duodenum. The [id number]                            Olympus double channel endoscope was introduced                            through the mouth, and advanced to the second part                            of duodenum. The upper GI endoscopy was                            accomplished without difficulty. The patient                            tolerated the procedure well. Scope In: Scope Out: Findings:      The esophagus revealed a distal esophageal ring. No Mallory-Weiss tear.      There was a large volume of fresh blood and dense clots in the proximal       stomach. The distal stomach was cleared of any lesions. With painstaking       effort and time the clots and blood were eventually cleared. There was       one area that look like a flat AVM which was injected with epinephrine       and treated with bipolar probe. However, adjacent to this was a subtle       but classic Dieulafoy lesion which was nonbleeding. This was clipped and       aggressive bleeding ensued. Patient had transient hypoxia and the       procedure was temporarily halted. She was intubated. She was rescoped.Marland Kitchen  Area was successfully injected with 8 mL of a 1:10,000 solution of       epinephrine for hemostasis. For hemostasis, three hemostatic clips were       successfully placed. There was no bleeding at the end of the procedure.       Stomach was otherwise normal. Small hiatal hernia      The examined duodenum was normal.      The cardia and gastric fundus were normal on retroflexion. Impression:               1. Massive upper GI bleed secondary to dieulafoy                            lesion                           2. Status post successful endoscopic hemostatic                             therapy                           3. EGD otherwise remarkable for large caliber                            esophageal ring and hiatal hernia. Moderate Sedation:      none Recommendation:           1. Okay to continue PPI                           2. Transfuse 2 desired hemoglobin                           3. If patient were to rebleed she should go                            directly to interventional radiology for                            embolization. The area of concern should be easy to                            find with the placement of hemostatic clips.                           4. On-call GI Dr. Hilarie Fredrickson aware for our team. Procedure Code(s):        --- Professional ---                           [phone], Esophagogastroduodenoscopy, flexible,                            transoral; with control of bleeding, any method Diagnosis Code(s):        --- Professional ---                           [id number],  Dieulafoy lesion (hemorrhagic) of stomach                            and duodenum                           D62, Acute posthemorrhagic anemia                           K92.0, Hematemesis CPT copyright 2019 [employer]. All rights reserved. The codes documented in this report are preliminary and upon coder review may  be revised to meet current compliance requirements. Docia Chuck. Henrene Pastor, MD 03/13/2020 6:08:40 PM This report has been signed electronically. Number of Addenda: 0

## 2020-03-14 ENCOUNTER — Other Ambulatory Visit: Payer: Self-pay

## 2020-03-14 DIAGNOSIS — D62 Acute posthemorrhagic anemia: Secondary | ICD-10-CM

## 2020-03-14 DIAGNOSIS — I10 Essential (primary) hypertension: Secondary | ICD-10-CM

## 2020-03-14 DIAGNOSIS — N1831 Chronic kidney disease, stage 3a: Secondary | ICD-10-CM

## 2020-03-14 DIAGNOSIS — K3182 Dieulafoy lesion (hemorrhagic) of stomach and duodenum: Secondary | ICD-10-CM

## 2020-03-14 DIAGNOSIS — K922 Gastrointestinal hemorrhage, unspecified: Secondary | ICD-10-CM

## 2020-03-14 DIAGNOSIS — N179 Acute kidney failure, unspecified: Secondary | ICD-10-CM

## 2020-03-14 LAB — HEMOGLOBIN AND HEMATOCRIT, BLOOD
HCT: 24.3 % — ABNORMAL LOW (ref 36.0–46.0)
HCT: 22.5 % — ABNORMAL LOW (ref 36.0–46.0)
Hemoglobin: 8.2 g/dL — ABNORMAL LOW (ref 12.0–15.0)
Hemoglobin: 7.5 g/dL — ABNORMAL LOW (ref 12.0–15.0)

## 2020-03-14 LAB — BASIC METABOLIC PANEL
Anion gap: 8 (ref 5–15)
BUN: 73 mg/dL — ABNORMAL HIGH (ref 8–23)
CO2: 19 mmol/L — ABNORMAL LOW (ref 22–32)
Calcium: 8.3 mg/dL — ABNORMAL LOW (ref 8.9–10.3)
Chloride: 111 mmol/L (ref 98–111)
Creatinine, Ser: 2.21 mg/dL — ABNORMAL HIGH (ref 0.44–1.00)
GFR calc Af Amer: 25 mL/min — ABNORMAL LOW (ref >60)
GFR calc non Af Amer: 22 mL/min — ABNORMAL LOW (ref >60)
Glucose, Bld: 114 mg/dL — ABNORMAL HIGH (ref 70–99)
Potassium: 5.8 mmol/L — ABNORMAL HIGH (ref 3.5–5.1)
Sodium: 138 mmol/L (ref 135–145)

## 2020-03-14 LAB — GLUCOSE, CAPILLARY
Glucose-Capillary: 111 mg/dL — ABNORMAL HIGH (ref 70–99)
Glucose-Capillary: 80 mg/dL (ref 70–99)
Glucose-Capillary: 75 mg/dL (ref 70–99)
Glucose-Capillary: 130 mg/dL — ABNORMAL HIGH (ref 70–99)
Glucose-Capillary: 124 mg/dL — ABNORMAL HIGH (ref 70–99)
Glucose-Capillary: 119 mg/dL — ABNORMAL HIGH (ref 70–99)

## 2020-03-14 LAB — OSMOLALITY, URINE: Osmolality, Ur: 396 mOsm/kg (ref 300–900)

## 2020-03-14 LAB — NA AND K (SODIUM & POTASSIUM), RAND UR
Potassium Urine: 25 mmol/L
Sodium, Ur: 84 mmol/L

## 2020-03-14 LAB — CBC
HCT: 23.8 % — ABNORMAL LOW (ref 36.0–46.0)
Hemoglobin: 7.7 g/dL — ABNORMAL LOW (ref 12.0–15.0)
MCH: 28.8 pg (ref 26.0–34.0)
MCHC: 32.4 g/dL (ref 30.0–36.0)
MCV: 89.1 fL (ref 80.0–100.0)
Platelets: 125 10*3/uL — ABNORMAL LOW (ref 150–400)
RBC: 2.67 MIL/uL — ABNORMAL LOW (ref 3.87–5.11)
RDW: 17 % — ABNORMAL HIGH (ref 11.5–15.5)
WBC: 10.1 10*3/uL (ref 4.0–10.5)
nRBC: 0.6 % — ABNORMAL HIGH (ref 0.0–0.2)

## 2020-03-14 LAB — CREATININE, URINE, RANDOM: Creatinine, Urine: 36.24 mg/dL

## 2020-03-14 MED ORDER — SODIUM ZIRCONIUM CYCLOSILICATE 10 G PO PACK
10.0000 g | PACK | Freq: Once | ORAL | Status: AC
  Administered 2020-03-14: 08:00:00 10 g via ORAL
  Filled 2020-03-14: qty 1

## 2020-03-14 MED ORDER — LIP MEDEX EX OINT
TOPICAL_OINTMENT | CUTANEOUS | Status: AC
  Administered 2020-03-14: 1
  Filled 2020-03-14: qty 7

## 2020-03-14 MED ORDER — SODIUM POLYSTYRENE SULFONATE 15 GM/60ML PO SUSP: 15.0000 g | Freq: Once | ORAL | Status: DC

## 2020-03-14 NOTE — Progress Notes (Signed)
Patient ID: Anita [name], female   DOB: [birth year], 74 y.o.   MRN: [id number]  PROGRESS NOTE    Anita [name]  [id number] DOB: Mar 03, 1946 DOA: 03/13/2020 PCP: [name], MD   Brief Narrative:  74 year old female with history of cervical dysplasia status post hysterectomy on 02/19/2020, essential hypertension, hyperlipidemia presented with hematemesis and weakness.  She was found to have hemoglobin of 4.8; 2 units packed red cells were transfused.  CT of the abdomen without contrast showed no acute findings in the abdomen or pelvis.  GI was consulted.  She underwent EGD on 03/13/2020.  Assessment & Plan:   Upper GI bleeding presenting with hematemesis most likely secondary to dieulafoy lesion Acute blood loss anemia -Presented with hemoglobin of 4.8 and was transfused 2 unit packed red cells -Status post EGD by GI on 03/13/2020 which showed dieulafoy lesion which was treated with endoscopy hemostatic therapy -Hemoglobin 8.2 this morning.  Continue H&H monitoring -Continue IV PPI.  Follow further GI recommendations  History of recent hysterectomy -No recent vaginal bleed.  Outpatient follow-up with GYN  Acute kidney injury on chronic kidney disease IIIa -Baseline creatinine around 1.5.  Presented with creatinine of 2.9.  Improving.  Creatinine 2.21 today.  Increase Ringer's lactate to 75 cc an hour.  Hyperkalemia -Likely from above.  Potassium 5.8 today.  Will give 1 more dose of Lokelma today.  Repeat a.m. labs.  QTC prolongation -Presented with QTC of 525.  Avoid QTC prolonging agents.  Improved to 469 today.  Essential hypertension -Blood pressure stable for now.  Norvasc on hold.  Hyperlipidemia - continue Lipitor  Generalized weakness/physical debility -PT/OT eval.   DVT prophylaxis: SCDs Code Status: Full Family Communication: Spoke to patient at bedside Disposition Plan: Home in 1 to 2 days once cleared by GI and once hemoglobin stabilizes and patient  tolerates PT.  Consultants: GI  Procedures:  EGD on 03/13/2020 Impression:               1. Massive upper GI bleed secondary to dieulafoy                            lesion                           2. Status post successful endoscopic hemostatic                            therapy                           3. EGD otherwise remarkable for large caliber                            esophageal ring and hiatal hernia. Moderate Sedation:      none Recommendation:           1. Okay to continue PPI                           2. Transfuse 2 desired hemoglobin                           3. If patient were to rebleed she should go  directly to interventional radiology for                            embolization. The area of concern should be easy to                            find with the placement of hemostatic clips.                           4. On-call GI Dr. Hilarie Fredrickson aware for our team.  Antimicrobials:  None  Subjective: Patient seen and examined at bedside.  No overnight fever, nausea, vomiting, hematemesis or melena.  Objective: Vitals:   03/14/20 0700 03/14/20 0735 03/14/20 0800 03/14/20 0812  BP: 133/68   (!) 134/48  Pulse:    93  Resp: (!) 21  16 19   Temp:  98 F (36.7 C)    TempSrc:  Oral    SpO2: 94%   96%  Weight:        Intake/Output Summary (Last 24 hours) at 03/14/2020 0928 Last data filed at 03/14/2020 0747 Gross per 24 hour  Intake 1510.19 ml  Output 1700 ml  Net -189.81 ml   Filed Weights   03/13/20 1422  Weight: 59 kg    Examination:  General exam: Appears calm and comfortable  Respiratory system: Bilateral decreased breath sounds at bases Cardiovascular system: S1 & S2 heard, Rate controlled Gastrointestinal system: Abdomen is nondistended, soft and nontender. Normal bowel sounds heard. Extremities: No cyanosis, clubbing; trace edema Central nervous system: Alert and oriented. No focal neurological deficits. Moving  extremities Skin: No rashes, lesions or ulcers Psychiatry: Flat affect.    Data Reviewed: I have personally reviewed following labs and imaging studies  CBC: Recent Labs  Lab 03/13/20 0822 03/13/20 0834 03/13/20 0939 03/13/20 1054 03/13/20 1946 03/14/20 0258 03/14/20 0829  WBC 6.4  --  8.7 9.2  --  10.1  --   NEUTROABS 3.3  --   --   --   --   --   --   HGB 9.6*   < > 4.8* 5.3* 9.9* 7.7* 8.2*  HCT 30.3*   < > 14.9* 16.6* 30.1* 23.8* 24.3*  MCV 92.4  --  93.1 94.9  --  89.1  --   PLT 197  --  196 190  --  125*  --    < > = values in this interval not displayed.   Basic Metabolic Panel: Recent Labs  Lab 03/13/20 0822 03/13/20 0834 03/13/20 1946 03/14/20 0258  NA 139 137  --  138  K 5.7* 5.6* 5.8* 5.8*  CL 107 109  --  111  CO2 16*  --   --  19*  GLUCOSE 198* 187*  --  114*  BUN 97* 99*  --  73*  CREATININE 2.68* 2.90*  --  2.21*  CALCIUM 9.8  --   --  8.3*   GFR: Estimated Creatinine Clearance: 18.5 mL/min (A) (by C-G formula based on SCr of 2.21 mg/dL (H)). Liver Function Tests: Recent Labs  Lab 03/13/20 0822  AST 35  ALT 25  ALKPHOS 54  BILITOT 0.5  PROT 7.0  ALBUMIN 3.3*   No results for input(s): LIPASE, AMYLASE in the last 168 hours. No results for input(s): AMMONIA in the last 168 hours. Coagulation Profile: Recent Labs  Lab 03/13/20 [phone]  INR 1.2   Cardiac Enzymes: No results for input(s): CKTOTAL, CKMB, CKMBINDEX, TROPONINI in the last 168 hours. BNP (last 3 results) No results for input(s): PROBNP in the last 8760 hours. HbA1C: Recent Labs    03/13/20 0822  HGBA1C 4.3*   CBG: Recent Labs  Lab 03/13/20 1922 03/13/20 2305 03/14/20 0312 03/14/20 0737  GLUCAP 179* 98 111* 80   Lipid Profile: No results for input(s): CHOL, HDL, LDLCALC, TRIG, CHOLHDL, LDLDIRECT in the last 72 hours. Thyroid Function Tests: No results for input(s): TSH, T4TOTAL, FREET4, T3FREE, THYROIDAB in the last 72 hours. Anemia Panel: No results for  input(s): VITAMINB12, FOLATE, FERRITIN, TIBC, IRON, RETICCTPCT in the last 72 hours. Sepsis Labs: Recent Labs  Lab 03/13/20 0939 03/13/20 1054  LATICACIDVEN 3.1* 3.9*    Recent Results (from the past 240 hour(s))  Respiratory Panel by RT PCR (Flu A&B, Covid) - Nasopharyngeal Swab     Status: None   Collection Time: 03/13/20 12:03 PM   Specimen: Nasopharyngeal Swab  Result Value Ref Range Status   SARS Coronavirus 2 by RT PCR NEGATIVE NEGATIVE Final    Comment: (NOTE) SARS-CoV-2 target nucleic acids are NOT DETECTED. The SARS-CoV-2 RNA is generally detectable in upper respiratoy specimens during the acute phase of infection. The lowest concentration of SARS-CoV-2 viral copies this assay can detect is 131 copies/mL. A negative result does not preclude SARS-Cov-2 infection and should not be used as the sole basis for treatment or other patient management decisions. A negative result may occur with  improper specimen collection/handling, submission of specimen other than nasopharyngeal swab, presence of viral mutation(s) within the areas targeted by this assay, and inadequate number of viral copies (<131 copies/mL). A negative result must be combined with clinical observations, patient history, and epidemiological information. The expected result is Negative. Fact Sheet for Patients:  PinkCheek.be Fact Sheet for Healthcare Providers:  GravelBags.it This test is not yet ap proved or cleared by the [place] FDA and  has been authorized for detection and/or diagnosis of SARS-CoV-2 by FDA under an Emergency Use Authorization (EUA). This EUA will remain  in effect (meaning this test can be used) for the duration of the COVID-19 declaration under Section 564(b)(1) of the Act, 21 U.S.C. section 360bbb-3(b)(1), unless the authorization is terminated or revoked sooner.    Influenza A by PCR NEGATIVE NEGATIVE Final   Influenza  B by PCR NEGATIVE NEGATIVE Final    Comment: (NOTE) The Xpert Xpress SARS-CoV-2/FLU/RSV assay is intended as an aid in  the diagnosis of influenza from Nasopharyngeal swab specimens and  should not be used as a sole basis for treatment. Nasal washings and  aspirates are unacceptable for Xpert Xpress SARS-CoV-2/FLU/RSV  testing. Fact Sheet for Patients: PinkCheek.be Fact Sheet for Healthcare Providers: GravelBags.it This test is not yet approved or cleared by the [place] FDA and  has been authorized for detection and/or diagnosis of SARS-CoV-2 by  FDA under an Emergency Use Authorization (EUA). This EUA will remain  in effect (meaning this test can be used) for the duration of the  Covid-19 declaration under Section 564(b)(1) of the Act, 21  U.S.C. section 360bbb-3(b)(1), unless the authorization is  terminated or revoked. Performed at [facility], [place] [address], [place], [place] [address]   MRSA PCR Screening     Status: None   Collection Time: 03/13/20  6:45 PM   Specimen: Nasal Mucosa; Nasopharyngeal  Result Value Ref Range Status   MRSA by PCR NEGATIVE NEGATIVE  Final    Comment:        The GeneXpert MRSA Assay (FDA approved for NASAL specimens only), is one component of a comprehensive MRSA colonization surveillance program. It is not intended to diagnose MRSA infection nor to guide or monitor treatment for MRSA infections. Performed at [facility], [place] [address], [place], [place] [address]          Radiology Studies: CT ABDOMEN PELVIS WO CONTRAST  Result Date: 03/13/2020 CLINICAL DATA:  Nausea vomiting. Vomiting blood. EXAM: CT ABDOMEN AND PELVIS WITHOUT CONTRAST TECHNIQUE: Multidetector CT imaging of the abdomen and pelvis was performed following the standard protocol without IV contrast. COMPARISON:  None FINDINGS: Lower chest: No consolidation or pleural  effusion. Signs of mitral annular calcification and right coronary artery calcification. Heart is incompletely imaged. Hepatobiliary: Liver without focal lesion. Sludge in the gallbladder lumen. Pancreas: 7 mm area of low attenuation in the pancreatic head best seen on coronal image 38 of series 4, not clearly cystic. No ductal dilation or peripancreatic inflammation. Spleen: Spleen is normal size without focal lesion. Adrenals/Urinary Tract: Left adrenal lesion 2.4 x 1.9 cm. Thickening of the right adrenal. The areas show very low density as low as -19 Hounsfield units. Hemorrhagic cysts in the left kidney. Renal cortical scarring bilaterally. Low-attenuation lesion in the interpolar right kidney approximately 1 cm likely a cyst. Small calculus in the upper pole the right kidney 2-3 mm. No hydronephrosis. Urinary bladder is under distended limiting assessment. Stomach/Bowel: Gastrointestinal tract without signs of acute abnormality. Normal appendix. Vascular/Lymphatic: Calcified atheromatous plaque throughout the abdominal aorta. Maximum caliber in the infrarenal segment approximately 2.2 cm. No adenopathy in the retroperitoneum. No pelvic lymphadenopathy. Reproductive: Post hysterectomy. Subtle area of added density along the anterior margin of the psoas appears to be contiguous with the ovarian vein on the right. Margins are slightly ill-defined. Other: There free air. No pneumatosis. Musculoskeletal: Spinal degenerative changes without acute or destructive bone process. IMPRESSION: No acute finding in the abdomen or pelvis. Bilateral adrenal thickening with more focal appearance on the left favored to represent asymmetric adrenal hyperplasia. Adrenal adenoma with lipid rich characteristics also considered. Biochemical correlation may be helpful. Low attenuation in the pancreatic head not meeting criteria for cyst, follow-up pancreatic protocol is suggested; there are no acute findings related to the pancreas.  Postoperative changes versus small ovarian remnant on the right anterior to the right psoas muscle. Correlate with surgical history. Follow-up imaging could be performed in 3-6 months as warranted. These results were called by telephone at the time of interpretation on 03/13/2020 at 9:49 am to provider Dr. Roslynn Amble, who verbally acknowledged these results. Aortic Atherosclerosis (ICD10-I70.0). Electronically Signed   By: Zetta Bills M.D.   On: 03/13/2020 09:50        Scheduled Meds: . atorvastatin  20 mg Oral QHS  . chlorhexidine  15 mL Mouth Rinse BID  . Chlorhexidine Gluconate Cloth  6 each Topical Daily  . cholecalciferol  1,000 Units Oral Daily  . insulin aspart  0-9 Units Subcutaneous Q4H  . mouth rinse  15 mL Mouth Rinse BID   Continuous Infusions: . sodium chloride    . lactated ringers 75 mL/hr at 03/14/20 0747  . pantoprozole (PROTONIX) infusion 8 mg/hr (03/14/20 0747)          Aline August, MD [facility] 03/14/2020, 9:28 AM

## 2020-03-14 NOTE — Progress Notes (Signed)
Occupational Therapy Evaluation  Clinical Impression PTA pt lives in an apartment alone with 1 step to enter. Pt reports performing all self-care tasks and functional mobility with Independence at PLOF. Overall pt performs self-care tasks with Modified Independence to Minimal assist and functional mobility with Minimal assist using RW. Pt demonstrates decreased dynamci standing balance deficits resulting in steadying assist during self-care tasks. Pt will benefit from continued skilled acute OT services.    03/14/20 1525  OT Visit Information  Last OT Received On 03/14/20  Assistance Needed +2  PT/OT/SLP Co-Evaluation/Treatment Yes  Reason for Co-Treatment For patient/therapist safety;To address functional/ADL transfers  PT goals addressed during session Mobility/safety with mobility;Balance;Proper use of DME  OT goals addressed during session ADL's and self-care  History of Present Illness Pt admitted with hematemesis and dx with massive upper GIB - clipped 03/13/20.  Pt with hx of CKD and cervical spondylosis.  Precautions  Precautions Fall  Restrictions  Weight Bearing Restrictions No  Home Living  Family/patient expects to be discharged to: Private residence  [place];Alone  Available Help at Discharge Family  Type of [place] to enter  Entrance Stairs-Number of Steps 1  [place] One level  Bathroom Biomedical scientist Yes  How Accessible Accessible via walker  [place] - 2 wheels  Prior Function  Level of Independence Independent  Communication  Communication No difficulties  Pain Assessment  Pain Assessment No/denies pain  Pain Score 0  Cognition  Arousal/Alertness Awake/alert  Behavior During Therapy WFL for tasks assessed/performed  Overall Cognitive Status Within Functional Limits for tasks assessed  Upper Extremity Assessment  Upper Extremity  Assessment Generalized weakness  Lower Extremity Assessment  Lower Extremity Assessment Generalized weakness  ADL  Overall ADL's  Needs assistance/impaired  Eating/Feeding Modified independent  Grooming Set up  Upper Body Bathing Set up  Lower Body Bathing Min guard  Upper Body Dressing  Set up  Lower Body Dressing Min guard  Toilet Transfer Minimal assistance  Toileting- Clothing Manipulation and Hygiene Minimal assistance  Tub/ Shower Transfer Minimal assistance  Functional mobility during ADLs Minimal assistance;Rolling walker  Vision- History  Baseline Vision/History Wears glasses  Wears Glasses At all times  Bed Mobility  Overal bed mobility Needs Assistance  Bed Mobility Supine to Sit  Supine to sit Supervision  Transfers  Overall transfer level Needs assistance  Equipment used Rolling walker (2 wheeled)  Transfers Sit to/from Stand  Sit to Stand Min assist  Balance  Overall balance assessment Needs assistance  Sitting-balance support Single extremity supported;No upper extremity supported  Sitting balance-Leahy Scale Good  Standing balance support Bilateral upper extremity supported  Standing balance-Leahy Scale Poor  OT - End of Session  Equipment Utilized During Treatment Rolling walker  Activity Tolerance Patient tolerated treatment well  Patient left in chair;with call bell/phone within reach;with chair alarm set;with family/visitor present  Nurse Communication Mobility status  OT Assessment  OT Recommendation/Assessment Patient needs continued OT Services  OT Visit Diagnosis Unsteadiness on feet (R26.81)  OT Problem List Decreased strength;Decreased activity tolerance;Impaired balance (sitting and/or standing);Decreased safety awareness;Decreased knowledge of use of DME or AE  OT Plan  OT Frequency (ACUTE ONLY) Min 2X/week  OT Treatment/Interventions (ACUTE ONLY) Self-care/ADL training;Therapeutic exercise;Energy conservation;DME and/or AE  instruction;Patient/family education;Balance training;Therapeutic activities  AM-PAC OT "6 Clicks" Daily Activity Outcome Measure (Version 2)  Help from another person eating meals? 4  Help from another person taking care  of personal grooming? 3  Help from another person toileting, which includes using toliet, bedpan, or urinal? 3  Help from another person bathing (including washing, rinsing, drying)? 3  Help from another person to put on and taking off regular upper body clothing? 3  Help from another person to put on and taking off regular lower body clothing? 3  6 Click Score 19  OT Recommendation  Follow Up Recommendations No OT follow up  OT Equipment 3 in 1 bedside commode  Individuals Consulted  Consulted and Agree with Results and Recommendations Patient  Acute Rehab OT Goals  Patient Stated Goal HOME   OT Goal Formulation With patient  Time For Goal Achievement 03/28/20  Potential to Achieve Goals Good  OT Time Calculation  OT Start Time (ACUTE ONLY) 1337  OT Stop Time (ACUTE ONLY) 1402  OT Time Calculation (min) 25 min  OT General Charges  $OT Visit 1 Visit  OT Evaluation  $OT Eval Moderate Complexity 1 Mod   Jazper Nikolai OTR/L

## 2020-03-14 NOTE — Progress Notes (Addendum)
[facility] Progress Note  CC:  Hematemesis   Subjective: No further hematemesis. No N/V or abdominal pain. She is tolerating a clear liquid diet. No BM. Daughter is at bed side. She wants to go home.    Objective:   S/P EGD 03/13/2020: 1. Massive upper GI bleed secondary to dieulafoy lesion 2. Status post successful endoscopic hemostatic therapy 3. EGD otherwise remarkable for large caliber esophageal ring and hiatal hernia.  Vital signs in last 24 hours: Temp:  [96.2 F (35.7 C)-99.3 F (37.4 C)] 98 F (36.7 C) (03/26 0735) Pulse Rate:  [83-96] 88 (03/26 1000) Resp:  [13-26] 19 (03/26 1000) BP: (50-181)/(22-100) 132/62 (03/26 1000) SpO2:  [93 %-100 %] 96 % (03/26 1000) Weight:  [59 kg] 59 kg (03/25 1422)   General:   Alert 74 year old female in NAD. Heart: RRR, 2/6 systolic murmur.  Pulm:  Few crackles in the bases.  Abdomen: Soft, nondistended. Nontender. Hypoactive BS x 4 quads.  Extremities:  Without edema. Neurologic:  Alert and  oriented x4;  grossly normal neurologically. Psych:  Alert and cooperative. Normal mood and affect.  Intake/Output from previous day: 03/25 0701 - 03/26 0700 In: 1398.3 [I.V.:768.3; Blood:630] Out: 1700 [Urine:1700] Intake/Output this shift: Total I/O In: 300.2 [I.V.:300.2] Out: -   Lab Results: Recent Labs    03/13/20 0939 03/13/20 0939 03/13/20 1054 03/13/20 1054 03/13/20 1946 03/14/20 0258 03/14/20 0829  WBC 8.7  --  9.2  --   --  10.1  --   HGB 4.8*   < > 5.3*   < > 9.9* 7.7* 8.2*  HCT 14.9*   < > 16.6*   < > 30.1* 23.8* 24.3*  PLT 196  --  190  --   --  125*  --    < > = values in this interval not displayed.   BMET Recent Labs    03/13/20 0822 03/13/20 0822 03/13/20 0834 03/13/20 1946 03/14/20 0258  NA 139  --  137  --  138  K 5.7*   < > 5.6* 5.8* 5.8*  CL 107  --  109  --  111  CO2 16*  --   --   --  19*  GLUCOSE 198*  --  187*  --  114*  BUN 97*  --  99*  --  73*  CREATININE 2.68*  --   2.90*  --  2.21*  CALCIUM 9.8  --   --   --  8.3*   < > = values in this interval not displayed.   LFT Recent Labs    03/13/20 0822  PROT 7.0  ALBUMIN 3.3*  AST 35  ALT 25  ALKPHOS 54  BILITOT 0.5   PT/INR Recent Labs    03/13/20 0822  LABPROT 14.7  INR 1.2   Hepatitis Panel No results for input(s): HEPBSAG, HCVAB, HEPAIGM, HEPBIGM in the last 72 hours.  CT ABDOMEN PELVIS WO CONTRAST  Result Date: 03/13/2020 CLINICAL DATA:  Nausea vomiting. Vomiting blood. EXAM: CT ABDOMEN AND PELVIS WITHOUT CONTRAST TECHNIQUE: Multidetector CT imaging of the abdomen and pelvis was performed following the standard protocol without IV contrast. COMPARISON:  None FINDINGS: Lower chest: No consolidation or pleural effusion. Signs of mitral annular calcification and right coronary artery calcification. Heart is incompletely imaged. Hepatobiliary: Liver without focal lesion. Sludge in the gallbladder lumen. Pancreas: 7 mm area of low attenuation in the pancreatic head best seen on coronal image 38 of series 4, not  clearly cystic. No ductal dilation or peripancreatic inflammation. Spleen: Spleen is normal size without focal lesion. Adrenals/Urinary Tract: Left adrenal lesion 2.4 x 1.9 cm. Thickening of the right adrenal. The areas show very low density as low as -19 Hounsfield units. Hemorrhagic cysts in the left kidney. Renal cortical scarring bilaterally. Low-attenuation lesion in the interpolar right kidney approximately 1 cm likely a cyst. Small calculus in the upper pole the right kidney 2-3 mm. No hydronephrosis. Urinary bladder is under distended limiting assessment. Stomach/Bowel: Gastrointestinal tract without signs of acute abnormality. Normal appendix. Vascular/Lymphatic: Calcified atheromatous plaque throughout the abdominal aorta. Maximum caliber in the infrarenal segment approximately 2.2 cm. No adenopathy in the retroperitoneum. No pelvic lymphadenopathy. Reproductive: Post hysterectomy. Subtle  area of added density along the anterior margin of the psoas appears to be contiguous with the ovarian vein on the right. Margins are slightly ill-defined. Other: There free air. No pneumatosis. Musculoskeletal: Spinal degenerative changes without acute or destructive bone process. IMPRESSION: No acute finding in the abdomen or pelvis. Bilateral adrenal thickening with more focal appearance on the left favored to represent asymmetric adrenal hyperplasia. Adrenal adenoma with lipid rich characteristics also considered. Biochemical correlation may be helpful. Low attenuation in the pancreatic head not meeting criteria for cyst, follow-up pancreatic protocol is suggested; there are no acute findings related to the pancreas. Postoperative changes versus small ovarian remnant on the right anterior to the right psoas muscle. Correlate with surgical history. Follow-up imaging could be performed in 3-6 months as warranted. These results were called by telephone at the time of interpretation on 03/13/2020 at 9:49 am to provider Dr. Roslynn Amble, who verbally acknowledged these results. Aortic Atherosclerosis (ICD10-I70.0). Electronically Signed   By: Zetta Bills M.D.   On: 03/13/2020 09:50    Assessment / Plan:  18. 74 year old female admitted with upper GI bleed with gross hematemesis 3/25.  Hemoglobin 9.6 down to 4.8.  She was transfused with 2 units of packed red blood cells.  Posttransfusion H&H 9.9.  She underwent an urgent EGD 3/25 by Dr. Henrene Pastor which identified a massive upper GI bleed secondary to a dieulafoy lesion which was successfully clipped.  No further evidence of active GI bleeding overnight.  Hemoglobin is stable at 8.2. -Clear liquid diet -Continue to monitor H&H bid for next 24 hrs -If active GI bleeding recurs she would require embolization by IR -Continue Pantoprazole 8 mg/hour infusion -IVF LR @ 75 cc/hr  2. AKI. Cr. 2.21  3. Hyperkalemia. K 5.8. Received Lokelma.   4. GERD -See plan in #  1  5. CTAP showed area of low attenuation to the head of the pancreas -follow up as outpatient   6. History of colon polyps. Due for a colonoscopy.  -Follow up with Dr. Carlean Purl as outpatient   Further recommendations per Dr. Henrene Pastor   Active Problems:   Hematemesis   Dieulafoy lesion of small intestine    LOS: 1 day   Noralyn Pick  03/14/2020, 11:12 AM  GI ATTENDING  Interval history data reviewed.  Agree with interval progress note as outlined above.  Thankfully the patient is stable post upper endoscopy for massive GI bleed secondary to duodenal Foy ulcer.  This point continue PPI, advance the patient's diet.  Let her ambulate.  I do think that she needs to stay in the hospital for another 48 hours (at least until Sunday) for observation for the possibility of early recurrent bleeding.  If she were to rebleed, the next step would  be embolization therapy with interventional radiology.  Docia Chuck. Geri [place]in[place]., M.D. [facility] Division of Gastroenterology

## 2020-03-14 NOTE — Evaluation (Signed)
Physical Therapy Evaluation Patient Details Name: Anita [name] MRN: [id number] DOB: 1946-09-24 Today's Date: 03/14/2020   History of Present Illness  Pt admitted with hematemesis and dx with massive upper GIB - clipped 03/13/20.  Pt with hx of CKD and cervical spondylosis.  Clinical Impression  Pt admitted as above and presenting with functional mobility limitations 2* generalized weakness and ambulatory balance deficits.  Pt should progress to dc home with family assist.  Pt states either her dtr will stay with her or she will stay with her dtr.    Follow Up Recommendations No PT follow up    Equipment Recommendations  None recommended by PT    Recommendations for Other Services       Precautions / Restrictions Precautions Precautions: Fall Restrictions Weight Bearing Restrictions: No      Mobility  Bed Mobility Overal bed mobility: Needs Assistance Bed Mobility: Supine to Sit     Supine to sit: Supervision        Transfers Overall transfer level: Needs assistance Equipment used: Rolling walker (2 wheeled) Transfers: Sit to/from Stand Sit to Stand: Min assist         General transfer comment: min assist to bring wt up and fwd and to balance in initial standing  Ambulation/Gait Ambulation/Gait assistance: Min assist Gait Distance (Feet): 95 Feet Assistive device: Rolling walker (2 wheeled) Gait Pattern/deviations: Step-to pattern;Step-through pattern;Decreased step length - right;Decreased step length - left;Shuffle;Trunk flexed Gait velocity: decr   General Gait Details: cues for posture and position from [employer]            Wheelchair Mobility    Modified Rankin (Stroke Patients Only)       Balance Overall balance assessment: Needs assistance Sitting-balance support: Single extremity supported;No upper extremity supported Sitting balance-Leahy Scale: Good     Standing balance support: Bilateral upper extremity supported Standing  balance-Leahy Scale: Poor                               Pertinent Vitals/Pain Pain Assessment: No/denies pain Pain Score: 0-No pain    Home Living Family/patient expects to be discharged to:: Private residence Living Arrangements: Children;Alone Available Help at Discharge: Family Type of Home: Apartment Home Access: Stairs to enter   Technical brewer of Steps: 1 Home Layout: One level Home Equipment: Environmental consultant - 2 wheels      Prior Function Level of Independence: Independent               Hand Dominance        Extremity/Trunk Assessment   Upper Extremity Assessment Upper Extremity Assessment: Generalized weakness    Lower Extremity Assessment Lower Extremity Assessment: Generalized weakness       Communication   Communication: No difficulties  Cognition Arousal/Alertness: Awake/alert Behavior During Therapy: WFL for tasks assessed/performed Overall Cognitive Status: Within Functional Limits for tasks assessed                                        General Comments      Exercises     Assessment/Plan    PT Assessment Patient needs continued PT services  PT Problem List Decreased strength;Decreased range of motion;Decreased activity tolerance;Decreased balance;Decreased mobility;Decreased knowledge of use of DME       PT Treatment Interventions DME instruction;Gait training;Stair training;Functional mobility training;Therapeutic exercise;Therapeutic activities;Balance training;Patient/family  education    PT Goals (Current goals can be found in the Care Plan section)  Acute Rehab PT Goals Patient Stated Goal: HOME  PT Goal Formulation: With patient Time For Goal Achievement: 03/28/20 Potential to Achieve Goals: Good    Frequency Min 3X/week   Barriers to discharge        Co-evaluation               AM-PAC PT "6 Clicks" Mobility  Outcome Measure Help needed turning from your back to your side while in  a flat bed without using bedrails?: None Help needed moving from lying on your back to sitting on the side of a flat bed without using bedrails?: A Little Help needed moving to and from a bed to a chair (including a wheelchair)?: A Little Help needed standing up from a chair using your arms (e.g., wheelchair or bedside chair)?: A Little Help needed to walk in hospital room?: A Little Help needed climbing 3-5 steps with a railing? : A Lot 6 Click Score: 18    End of Session Equipment Utilized During Treatment: Gait belt Activity Tolerance: Patient tolerated treatment well Patient left: in chair;with call bell/phone within reach;with chair alarm set Nurse Communication: Mobility status PT Visit Diagnosis: Difficulty in walking, not elsewhere classified (R26.2);Muscle weakness (generalized) (M62.81)    Time: [id number] PT Time Calculation (min) (ACUTE ONLY): 23 min   Charges:   PT Evaluation $PT Eval Low Complexity: 1 Low          [place] Pager [phone] Office [phone]   Jayci Ellefson 03/14/2020, 2:36 PM

## 2020-03-15 LAB — CBC
HCT: 22.6 % — ABNORMAL LOW (ref 36.0–46.0)
Hemoglobin: 7.5 g/dL — ABNORMAL LOW (ref 12.0–15.0)
MCH: 29.6 pg (ref 26.0–34.0)
MCHC: 33.2 g/dL (ref 30.0–36.0)
MCV: 89.3 fL (ref 80.0–100.0)
Platelets: 111 10*3/uL — ABNORMAL LOW (ref 150–400)
RBC: 2.53 MIL/uL — ABNORMAL LOW (ref 3.87–5.11)
RDW: 17.2 % — ABNORMAL HIGH (ref 11.5–15.5)
WBC: 11.3 10*3/uL — ABNORMAL HIGH (ref 4.0–10.5)
nRBC: 0.6 % — ABNORMAL HIGH (ref 0.0–0.2)

## 2020-03-15 LAB — GLUCOSE, CAPILLARY
Glucose-Capillary: 107 mg/dL — ABNORMAL HIGH (ref 70–99)
Glucose-Capillary: 93 mg/dL (ref 70–99)
Glucose-Capillary: 92 mg/dL (ref 70–99)
Glucose-Capillary: 111 mg/dL — ABNORMAL HIGH (ref 70–99)
Glucose-Capillary: 126 mg/dL — ABNORMAL HIGH (ref 70–99)

## 2020-03-15 LAB — MAGNESIUM: Magnesium: 1.6 mg/dL — ABNORMAL LOW (ref 1.7–2.4)

## 2020-03-15 LAB — BASIC METABOLIC PANEL
Anion gap: 7 (ref 5–15)
BUN: 41 mg/dL — ABNORMAL HIGH (ref 8–23)
CO2: 22 mmol/L (ref 22–32)
Calcium: 8.8 mg/dL — ABNORMAL LOW (ref 8.9–10.3)
Chloride: 112 mmol/L — ABNORMAL HIGH (ref 98–111)
Creatinine, Ser: 1.67 mg/dL — ABNORMAL HIGH (ref 0.44–1.00)
GFR calc Af Amer: 35 mL/min — ABNORMAL LOW (ref >60)
GFR calc non Af Amer: 30 mL/min — ABNORMAL LOW (ref >60)
Glucose, Bld: 99 mg/dL (ref 70–99)
Potassium: 4.4 mmol/L (ref 3.5–5.1)
Sodium: 141 mmol/L (ref 135–145)

## 2020-03-15 MED ORDER — MAGNESIUM SULFATE 2 GM/50ML IV SOLN
2.0000 g | Freq: Once | INTRAVENOUS | Status: AC
  Administered 2020-03-15: 2 g via INTRAVENOUS
  Filled 2020-03-15: qty 50

## 2020-03-15 MED ORDER — PANTOPRAZOLE SODIUM 40 MG IV SOLR
40.0000 mg | Freq: Two times a day (BID) | INTRAVENOUS | Status: DC
  Administered 2020-03-15 (×2): 40 mg via INTRAVENOUS
  Filled 2020-03-15 (×2): qty 40

## 2020-03-15 MED ORDER — INSULIN ASPART 100 UNIT/ML ~~LOC~~ SOLN: 0.0000 [IU] | Freq: Three times a day (TID) | SUBCUTANEOUS | Status: DC

## 2020-03-15 NOTE — Progress Notes (Addendum)
[facility] Progress Note  CC:  Hematemesis  Subjective: She did not sleep well last night otherwise no complaints. No N/V or abdominal pain. No hematemesis. No BM. She is tolerating a normal diet.   Objective:   S/P EGD 03/13/2020: 1. Massive upper GI bleed secondary to dieulafoy lesion 2. Status post successful endoscopic hemostatic therapy 3. EGD otherwise remarkable for large caliber esophageal ring and hiatal hernia  Vital signs in last 24 hours: Temp:  [97.9 F (36.6 C)-100.9 F (38.3 C)] 98.3 F (36.8 C) (03/27 0335) Pulse Rate:  [84-108] 87 (03/27 0400) Resp:  [15-26] 19 (03/27 0400) BP: (108-157)/(38-110) 150/55 (03/27 0400) SpO2:  [92 %-100 %] 95 % (03/27 0400) Last BM Date: 03/13/20 General:   Alert in NAD. Heart: RRR, systolic murmur.  Pulm: Breath sounds clear throughout.  Abdomen: Soft, nondistended. Nontender. + BS x 4 quads. Lower midline scar intact.  Extremities:  Without edema. Neurologic:  Alert and  oriented x4;  grossly normal neurologically. Psych:  Alert and cooperative. Normal mood and affect.  Intake/Output from previous day: 03/26 0701 - 03/27 0700 In: 1805.6 [I.V.:1805.6] Out: 2600 [Urine:2600] Intake/Output this shift: Total I/O In: 788.9 [I.V.:788.9] Out: 1700 [Urine:1700]  Lab Results: Recent Labs    03/13/20 1054 03/13/20 1946 03/14/20 0258 03/14/20 0258 03/14/20 0829 03/14/20 1844 03/15/20 0238  WBC 9.2  --  10.1  --   --   --  11.3*  HGB 5.3*   < > 7.7*   < > 8.2* 7.5* 7.5*  HCT 16.6*   < > 23.8*   < > 24.3* 22.5* 22.6*  PLT 190  --  125*  --   --   --  111*   < > = values in this interval not displayed.   BMET Recent Labs    03/13/20 0822 03/13/20 0822 03/13/20 0834 03/13/20 0834 03/13/20 1946 03/14/20 0258 03/15/20 0238  NA 139   < > 137  --   --  138 141  K 5.7*   < > 5.6*   < > 5.8* 5.8* 4.4  CL 107   < > 109  --   --  111 112*  CO2 16*  --   --   --   --  19* 22  GLUCOSE 198*   < > 187*   --   --  114* 99  BUN 97*   < > 99*  --   --  73* 41*  CREATININE 2.68*   < > 2.90*  --   --  2.21* 1.67*  CALCIUM 9.8  --   --   --   --  8.3* 8.8*   < > = values in this interval not displayed.   LFT Recent Labs    03/13/20 0822  PROT 7.0  ALBUMIN 3.3*  AST 35  ALT 25  ALKPHOS 54  BILITOT 0.5   PT/INR Recent Labs    03/13/20 0822  LABPROT 14.7  INR 1.2   Hepatitis Panel No results for input(s): HEPBSAG, HCVAB, HEPAIGM, HEPBIGM in the last 72 hours.  CT ABDOMEN PELVIS WO CONTRAST  Result Date: 03/13/2020 CLINICAL DATA:  Nausea vomiting. Vomiting blood. EXAM: CT ABDOMEN AND PELVIS WITHOUT CONTRAST TECHNIQUE: Multidetector CT imaging of the abdomen and pelvis was performed following the standard protocol without IV contrast. COMPARISON:  None FINDINGS: Lower chest: No consolidation or pleural effusion. Signs of mitral annular calcification and right coronary artery calcification. Heart is incompletely imaged. Hepatobiliary: Liver without  focal lesion. Sludge in the gallbladder lumen. Pancreas: 7 mm area of low attenuation in the pancreatic head best seen on coronal image 38 of series 4, not clearly cystic. No ductal dilation or peripancreatic inflammation. Spleen: Spleen is normal size without focal lesion. Adrenals/Urinary Tract: Left adrenal lesion 2.4 x 1.9 cm. Thickening of the right adrenal. The areas show very low density as low as -19 Hounsfield units. Hemorrhagic cysts in the left kidney. Renal cortical scarring bilaterally. Low-attenuation lesion in the interpolar right kidney approximately 1 cm likely a cyst. Small calculus in the upper pole the right kidney 2-3 mm. No hydronephrosis. Urinary bladder is under distended limiting assessment. Stomach/Bowel: Gastrointestinal tract without signs of acute abnormality. Normal appendix. Vascular/Lymphatic: Calcified atheromatous plaque throughout the abdominal aorta. Maximum caliber in the infrarenal segment approximately 2.2 cm. No  adenopathy in the retroperitoneum. No pelvic lymphadenopathy. Reproductive: Post hysterectomy. Subtle area of added density along the anterior margin of the psoas appears to be contiguous with the ovarian vein on the right. Margins are slightly ill-defined. Other: There free air. No pneumatosis. Musculoskeletal: Spinal degenerative changes without acute or destructive bone process. IMPRESSION: No acute finding in the abdomen or pelvis. Bilateral adrenal thickening with more focal appearance on the left favored to represent asymmetric adrenal hyperplasia. Adrenal adenoma with lipid rich characteristics also considered. Biochemical correlation may be helpful. Low attenuation in the pancreatic head not meeting criteria for cyst, follow-up pancreatic protocol is suggested; there are no acute findings related to the pancreas. Postoperative changes versus small ovarian remnant on the right anterior to the right psoas muscle. Correlate with surgical history. Follow-up imaging could be performed in 3-6 months as warranted. These results were called by telephone at the time of interpretation on 03/13/2020 at 9:49 am to provider Dr. Roslynn Amble, who verbally acknowledged these results. Aortic Atherosclerosis (ICD10-I70.0). Electronically Signed   By: Zetta Bills M.D.   On: 03/13/2020 09:50    Assessment / Plan:  15. 74 year old female admitted with upper GI bleed with gross hematemesis 3/25.  Admission Hemoglobin 9.6 -> 4.8.  She was transfused with 2 units of packed red blood cells 3/25. Posttransfusion H&H 9.9.  She underwent an urgent EGD 3/25 which identified a massive upper GI bleed secondary to a dieulafoy lesion which was successfully clipped. On Pantoprazole gtt. No further hematemesis. Today Hg 7.5. BUN 41. Patient to transfer out of the ICU to medical floor today.  -CBC in am -If she were to rebleed -> IR for embolization  -Ok to change Pantoprazole 40mg  IV bid -Most likely discharge home Sunday 3/28  2. AKI  improving. Cr. 2.21 -> 1.67  3. Hyperkalemia resolved. Today K+ 4.4.   4. GERD -See plan in # 1  5. CTAP showed area of low attenuation to the head of the pancreas -consider pancreas MRI, follow up as outpatient   6. History of colon polyps. Due for a colonoscopy.  -Follow up with Dr. Carlean Purl as outpatient surveillance program   Further recommendations per Dr. Henrene Pastor   Active Problems:   Hematemesis   Dieulafoy lesion of small intestine   Dieulafoy lesion of stomach   Acute blood loss anemia     LOS: 2 days   Anita [name]  03/15/2020, 5:02 AM  GI ATTENDING  Interval history and data reviewed.  Patient seen and examined.  Agree with interval progress note as outlined above.  Fortunately the patient has remained stable since her therapeutic endoscopy Thursday evening.  Tolerating diet well.  No bowel movements.  Stable hemoglobin.  Please observe the patient for 1 additional day in the hospital setting to be assured no early recurrent bleeding.  If she is doing well in the morning with no evidence of bleeding, she may be discharged home.  Outpatient follow-up with Dr. Carlean Purl or his advanced practitioner will be arranged.  Please call for questions or problems.  We will sign off.  Thank you.  Docia Chuck. Geri Seminole., M.D. [facility] Division of Gastroenterology

## 2020-03-15 NOTE — Progress Notes (Signed)
Patient ID: Anita [name], female   DOB: 1946/01/28, 74 y.o.   MRN: [id number]  PROGRESS NOTE    TRUMAN WAGLE  [id number] DOB: 05-26-1946 DOA: 03/13/2020 PCP: [name], MD   Brief Narrative:  74 year old female with history of cervical dysplasia status post hysterectomy on 02/19/2020, essential hypertension, hyperlipidemia presented with hematemesis and weakness.  She was found to have hemoglobin of 4.8; 2 units packed red cells were transfused.  CT of the abdomen without contrast showed no acute findings in the abdomen or pelvis.  GI was consulted.  She underwent EGD on 03/13/2020.  Assessment & Plan:   Upper GI bleeding presenting with hematemesis most likely secondary to dieulafoy lesion Acute blood loss anemia -Presented with hemoglobin of 4.8 and was transfused 2 unit packed red cells -Status post EGD by GI on 03/13/2020 which showed dieulafoy lesion which was treated with endoscopy hemostatic therapy -Hemoglobin 7.5 this morning.  Continue H&H monitoring -Continue IV PPI, will switch to twice a day IV.  Follow further GI recommendations.  Advance diet as tolerated. -Transfer to MedSurg  History of recent hysterectomy -No recent vaginal bleed.  Outpatient follow-up with GYN  Acute kidney injury on chronic kidney disease IIIa -Baseline creatinine around 1.5.  Presented with creatinine of 2.9.  Improving.  Creatinine 1.67 today.  Decrease Ringer's lactate to 50 cc an hour.  Hyperkalemia -Likely from above.  Potassium 4.4 today.  Patient received 2 doses of Lokelma so far during the hospitalization.  Repeat a.m. labs.  QTC prolongation -Presented with QTC of 525.  Avoid QTC prolonging agents.  Improved to 469 on 03/14/2020  Essential hypertension -Blood pressure stable for now.  Norvasc on hold.  Hyperlipidemia - continue Lipitor  Generalized weakness/physical debility -PT/OT eval.   DVT prophylaxis: SCDs Code Status: Full Family Communication: Spoke to  patient at bedside Disposition Plan: Home in 1 to 2 days once cleared by GI and once hemoglobin stabilizes and patient tolerates PT.  Consultants: GI  Procedures:  EGD on 03/13/2020 Impression:               1. Massive upper GI bleed secondary to dieulafoy                            lesion                           2. Status post successful endoscopic hemostatic                            therapy                           3. EGD otherwise remarkable for large caliber                            esophageal ring and hiatal hernia. Moderate Sedation:      none Recommendation:           1. Okay to continue PPI                           2. Transfuse 2 desired hemoglobin  3. If patient were to rebleed she should go                            directly to interventional radiology for                            embolization. The area of concern should be easy to                            find with the placement of hemostatic clips.                           4. On-call GI Dr. Hilarie Fredrickson aware for our team.  Antimicrobials:  None  Subjective: Patient seen and examined at bedside.  Denies any overnight black or bloody stools.  No overnight fever, vomiting or worsening shortness of breath. Objective: Vitals:   03/15/20 0400 03/15/20 0600 03/15/20 0642 03/15/20 0700  BP: (!) 150/55 (!) 137/47  (!) 124/44  Pulse: 87 80  88  Resp: 19 15  15   Temp:   99.7 F (37.6 C)   TempSrc:   Axillary   SpO2: 95% 93%  95%  Weight:      Height:        Intake/Output Summary (Last 24 hours) at 03/15/2020 0727 Last data filed at 03/15/2020 0700 Gross per 24 hour  Intake 1993.92 ml  Output 2600 ml  Net -606.08 ml   Filed Weights   03/13/20 1422  Weight: 59 kg    Examination:  General exam: No acute distress. Respiratory system: Bilateral decreased breath sounds at bases with some scattered crackles Cardiovascular system: Rate controlled, S1-S2 heard Gastrointestinal system:  Abdomen is nondistended, soft and nontender.  Bowel sounds are heard  extremities: Trace lower extremity edema present; no cyanosis  Central nervous system: Awake and alert.  Moving extremities.   Skin: No ulcers, lesions Psychiatry: Has extremely flat affect   Data Reviewed: I have personally reviewed following labs and imaging studies  CBC: Recent Labs  Lab 03/13/20 0822 03/13/20 0834 03/13/20 0939 03/13/20 0939 03/13/20 1054 03/13/20 1054 03/13/20 1946 03/14/20 0258 03/14/20 0829 03/14/20 1844 03/15/20 0238  WBC 6.4  --  8.7  --  9.2  --   --  10.1  --   --  11.3*  NEUTROABS 3.3  --   --   --   --   --   --   --   --   --   --   HGB 9.6*   < > 4.8*   < > 5.3*   < > 9.9* 7.7* 8.2* 7.5* 7.5*  HCT 30.3*   < > 14.9*   < > 16.6*   < > 30.1* 23.8* 24.3* 22.5* 22.6*  MCV 92.4  --  93.1  --  94.9  --   --  89.1  --   --  89.3  PLT 197  --  196  --  190  --   --  125*  --   --  111*   < > = values in this interval not displayed.   Basic Metabolic Panel: Recent Labs  Lab 03/13/20 0822 03/13/20 0834 03/13/20 1946 03/14/20 0258 03/15/20 0238  NA 139 137  --  138 141  K 5.7* 5.6* 5.8* 5.8* 4.4  CL  107 109  --  111 112*  CO2 16*  --   --  19* 22  GLUCOSE 198* 187*  --  114* 99  BUN 97* 99*  --  73* 41*  CREATININE 2.68* 2.90*  --  2.21* 1.67*  CALCIUM 9.8  --   --  8.3* 8.8*  MG  --   --   --   --  1.6*   GFR: Estimated Creatinine Clearance: 24.4 mL/min (A) (by C-G formula based on SCr of 1.67 mg/dL (H)). Liver Function Tests: Recent Labs  Lab 03/13/20 0822  AST 35  ALT 25  ALKPHOS 54  BILITOT 0.5  PROT 7.0  ALBUMIN 3.3*   No results for input(s): LIPASE, AMYLASE in the last 168 hours. No results for input(s): AMMONIA in the last 168 hours. Coagulation Profile: Recent Labs  Lab 03/13/20 0822  INR 1.2   Cardiac Enzymes: No results for input(s): CKTOTAL, CKMB, CKMBINDEX, TROPONINI in the last 168 hours. BNP (last 3 results) No results for input(s): PROBNP  in the last 8760 hours. HbA1C: Recent Labs    03/13/20 0822  HGBA1C 4.3*   CBG: Recent Labs  Lab 03/14/20 1134 03/14/20 1532 03/14/20 1924 03/14/20 2254 03/15/20 0317  GLUCAP 75 130* 124* 119* 107*   Lipid Profile: No results for input(s): CHOL, HDL, LDLCALC, TRIG, CHOLHDL, LDLDIRECT in the last 72 hours. Thyroid Function Tests: No results for input(s): TSH, T4TOTAL, FREET4, T3FREE, THYROIDAB in the last 72 hours. Anemia Panel: No results for input(s): VITAMINB12, FOLATE, FERRITIN, TIBC, IRON, RETICCTPCT in the last 72 hours. Sepsis Labs: Recent Labs  Lab 03/13/20 0939 03/13/20 1054  LATICACIDVEN 3.1* 3.9*    Recent Results (from the past 240 hour(s))  Respiratory Panel by RT PCR (Flu A&B, Covid) - Nasopharyngeal Swab     Status: None   Collection Time: 03/13/20 12:03 PM   Specimen: Nasopharyngeal Swab  Result Value Ref Range Status   SARS Coronavirus 2 by RT PCR NEGATIVE NEGATIVE Final    Comment: (NOTE) SARS-CoV-2 target nucleic acids are NOT DETECTED. The SARS-CoV-2 RNA is generally detectable in upper respiratoy specimens during the acute phase of infection. The lowest concentration of SARS-CoV-2 viral copies this assay can detect is 131 copies/mL. A negative result does not preclude SARS-Cov-2 infection and should not be used as the sole basis for treatment or other patient management decisions. A negative result may occur with  improper specimen collection/handling, submission of specimen other than nasopharyngeal swab, presence of viral mutation(s) within the areas targeted by this assay, and inadequate number of viral copies (<131 copies/mL). A negative result must be combined with clinical observations, patient history, and epidemiological information. The expected result is Negative. Fact Sheet for Patients:  PinkCheek.be Fact Sheet for Healthcare Providers:  GravelBags.it This test is not yet ap  proved or cleared by the [place] FDA and  has been authorized for detection and/or diagnosis of SARS-CoV-2 by FDA under an Emergency Use Authorization (EUA). This EUA will remain  in effect (meaning this test can be used) for the duration of the COVID-19 declaration under Section 564(b)(1) of the Act, 21 U.S.C. section 360bbb-3(b)(1), unless the authorization is terminated or revoked sooner.    Influenza A by PCR NEGATIVE NEGATIVE Final   Influenza B by PCR NEGATIVE NEGATIVE Final    Comment: (NOTE) The Xpert Xpress SARS-CoV-2/FLU/RSV assay is intended as an aid in  the diagnosis of influenza from Nasopharyngeal swab specimens and  should not be used as a  sole basis for treatment. Nasal washings and  aspirates are unacceptable for Xpert Xpress SARS-CoV-2/FLU/RSV  testing. Fact Sheet for Patients: PinkCheek.be Fact Sheet for Healthcare Providers: GravelBags.it This test is not yet approved or cleared by the [place] FDA and  has been authorized for detection and/or diagnosis of SARS-CoV-2 by  FDA under an Emergency Use Authorization (EUA). This EUA will remain  in effect (meaning this test can be used) for the duration of the  Covid-19 declaration under Section 564(b)(1) of the Act, 21  U.S.C. section 360bbb-3(b)(1), unless the authorization is  terminated or revoked. Performed at [facility], [place] [address] [place], [place] [address]   MRSA PCR Screening     Status: None   Collection Time: 03/13/20  6:45 PM   Specimen: Nasal Mucosa; Nasopharyngeal  Result Value Ref Range Status   MRSA by PCR NEGATIVE NEGATIVE Final    Comment:        The GeneXpert MRSA Assay (FDA approved for NASAL specimens only), is one component of a comprehensive MRSA colonization surveillance program. It is not intended to diagnose MRSA infection nor to guide or monitor treatment for MRSA infections. Performed at  [facility], [place] [address] [place], [place] [address]          Radiology Studies: CT ABDOMEN PELVIS WO CONTRAST  Result Date: 03/13/2020 CLINICAL DATA:  Nausea vomiting. Vomiting blood. EXAM: CT ABDOMEN AND PELVIS WITHOUT CONTRAST TECHNIQUE: Multidetector CT imaging of the abdomen and pelvis was performed following the standard protocol without IV contrast. COMPARISON:  None FINDINGS: Lower chest: No consolidation or pleural effusion. Signs of mitral annular calcification and right coronary artery calcification. Heart is incompletely imaged. Hepatobiliary: Liver without focal lesion. Sludge in the gallbladder lumen. Pancreas: 7 mm area of low attenuation in the pancreatic head best seen on coronal image 38 of series 4, not clearly cystic. No ductal dilation or peripancreatic inflammation. Spleen: Spleen is normal size without focal lesion. Adrenals/Urinary Tract: Left adrenal lesion 2.4 x 1.9 cm. Thickening of the right adrenal. The areas show very low density as low as -19 Hounsfield units. Hemorrhagic cysts in the left kidney. Renal cortical scarring bilaterally. Low-attenuation lesion in the interpolar right kidney approximately 1 cm likely a cyst. Small calculus in the upper pole the right kidney 2-3 mm. No hydronephrosis. Urinary bladder is under distended limiting assessment. Stomach/Bowel: Gastrointestinal tract without signs of acute abnormality. Normal appendix. Vascular/Lymphatic: Calcified atheromatous plaque throughout the abdominal aorta. Maximum caliber in the infrarenal segment approximately 2.2 cm. No adenopathy in the retroperitoneum. No pelvic lymphadenopathy. Reproductive: Post hysterectomy. Subtle area of added density along the anterior margin of the psoas appears to be contiguous with the ovarian vein on the right. Margins are slightly ill-defined. Other: There free air. No pneumatosis. Musculoskeletal: Spinal degenerative changes without acute or destructive  bone process. IMPRESSION: No acute finding in the abdomen or pelvis. Bilateral adrenal thickening with more focal appearance on the left favored to represent asymmetric adrenal hyperplasia. Adrenal adenoma with lipid rich characteristics also considered. Biochemical correlation may be helpful. Low attenuation in the pancreatic head not meeting criteria for cyst, follow-up pancreatic protocol is suggested; there are no acute findings related to the pancreas. Postoperative changes versus small ovarian remnant on the right anterior to the right psoas muscle. Correlate with surgical history. Follow-up imaging could be performed in 3-6 months as warranted. These results were called by telephone at the time of interpretation on 03/13/2020 at 9:49 am to provider  Dr. Roslynn Amble, who verbally acknowledged these results. Aortic Atherosclerosis (ICD10-I70.0). Electronically Signed   By: Zetta Bills M.D.   On: 03/13/2020 09:50        Scheduled Meds: . atorvastatin  20 mg Oral QHS  . chlorhexidine  15 mL Mouth Rinse BID  . Chlorhexidine Gluconate Cloth  6 each Topical Daily  . cholecalciferol  1,000 Units Oral Daily  . insulin aspart  0-9 Units Subcutaneous Q4H  . mouth rinse  15 mL Mouth Rinse BID   Continuous Infusions: . sodium chloride    . lactated ringers 75 mL/hr at 03/15/20 0700  . pantoprozole (PROTONIX) infusion 8 mg/hr (03/15/20 0700)          Aline August, MD [facility] 03/15/2020, 7:27 AM

## 2020-03-16 LAB — CBC
HCT: 24 % — ABNORMAL LOW (ref 36.0–46.0)
Hemoglobin: 7.6 g/dL — ABNORMAL LOW (ref 12.0–15.0)
MCH: 29.1 pg (ref 26.0–34.0)
MCHC: 31.7 g/dL (ref 30.0–36.0)
MCV: 92 fL (ref 80.0–100.0)
Platelets: 116 10*3/uL — ABNORMAL LOW (ref 150–400)
RBC: 2.61 MIL/uL — ABNORMAL LOW (ref 3.87–5.11)
RDW: 17.4 % — ABNORMAL HIGH (ref 11.5–15.5)
WBC: 9.3 10*3/uL (ref 4.0–10.5)
nRBC: 0.5 % — ABNORMAL HIGH (ref 0.0–0.2)

## 2020-03-16 LAB — MAGNESIUM: Magnesium: 1.9 mg/dL (ref 1.7–2.4)

## 2020-03-16 LAB — GLUCOSE, CAPILLARY: Glucose-Capillary: 105 mg/dL — ABNORMAL HIGH (ref 70–99)

## 2020-03-16 LAB — BASIC METABOLIC PANEL
Anion gap: 10 (ref 5–15)
BUN: 28 mg/dL — ABNORMAL HIGH (ref 8–23)
CO2: 18 mmol/L — ABNORMAL LOW (ref 22–32)
Calcium: 8.8 mg/dL — ABNORMAL LOW (ref 8.9–10.3)
Chloride: 107 mmol/L (ref 98–111)
Creatinine, Ser: 1.62 mg/dL — ABNORMAL HIGH (ref 0.44–1.00)
GFR calc Af Amer: 37 mL/min — ABNORMAL LOW (ref >60)
GFR calc non Af Amer: 32 mL/min — ABNORMAL LOW (ref >60)
Glucose, Bld: 123 mg/dL — ABNORMAL HIGH (ref 70–99)
Potassium: 4 mmol/L (ref 3.5–5.1)
Sodium: 135 mmol/L (ref 135–145)

## 2020-03-16 MED ORDER — ONDANSETRON 4 MG PO TBDP: 4.0000 mg | ORAL_TABLET | Freq: Three times a day (TID) | ORAL | 0 refills | Status: DC | PRN

## 2020-03-16 MED ORDER — ATORVASTATIN CALCIUM 20 MG PO TABS: 20.0000 mg | ORAL_TABLET | Freq: Every day | ORAL | Status: DC

## 2020-03-16 MED ORDER — PANTOPRAZOLE SODIUM 40 MG PO TBEC: 40.0000 mg | DELAYED_RELEASE_TABLET | Freq: Two times a day (BID) | ORAL | 0 refills | Status: DC

## 2020-03-16 NOTE — Progress Notes (Signed)
Discharge instructions discussed with patient, verbalized agreement and understanding 

## 2020-03-16 NOTE — Discharge Summary (Signed)
Physician Discharge Summary  Anita [name] [id number] DOB: 1946-01-25 DOA: 03/13/2020  PCP: [name], MD  Admit date: 03/13/2020 Discharge date: 03/16/2020  Admitted From: Home Disposition: Home  Recommendations for Outpatient Follow-up:  1. Follow up with PCP in 1 week with repeat CBC/BMP 2. Outpatient follow-up with GI 3. Follow up in ED if symptoms worsen or new appear   Home Health: No Equipment/Devices: None  Discharge Condition: Stable CODE STATUS: Full Diet recommendation: Heart healthy  Brief/Interim Summary: 74 year old female with history of cervical dysplasia status post hysterectomy on 02/19/2020, essential hypertension, hyperlipidemia presented with hematemesis and weakness.  She was found to have hemoglobin of 4.8; 2 units packed red cells were transfused.  CT of the abdomen without contrast showed no acute findings in the abdomen or pelvis.  GI was consulted.  She underwent EGD on 03/13/2020.  Postprocedure, she was continued on IV Protonix which was subsequently switched to twice a day IV Protonix on 03/15/2020 as per GI recommendations.  GI recommended to monitor the patient in the hospital till 03/16/2020 morning.  Hemoglobin has remained stable.  She is tolerating diet.  She wants to go home.  She will be discharged home with outpatient follow-up with GI on oral Protonix.  Discharge Diagnoses:   Upper GI bleeding presenting with hematemesis most likely secondary to dieulafoy lesion Acute blood loss anemia -Presented with hemoglobin of 4.8 and was transfused 2 unit packed red cells -Status post EGD by GI on 03/13/2020 which showed dieulafoy lesion which was treated with endoscopy hemostatic therapy -Hemoglobin 7.6 this morning.    No further signs of overt GI bleeding. -Treated with Protonix drip which were switched to IV Protonix twice daily on 03/15/2020 as per GI recommendations.  GI has signed off recommending outpatient follow-up. -Tolerating diet.   Will discharge patient home on Protonix twice a day with outpatient follow-up with PCP and GI.  Patient will need repeat CBC within a week.  Hold aspirin on discharge.  History of recent hysterectomy -No recent vaginal bleed.  Outpatient follow-up with GYN  Acute kidney injury on chronic kidney disease IIIa -Baseline creatinine around 1.5.  Presented with creatinine of 2.9.  Improving.    Treated with IV fluids.  Creatinine level pending this morning.  Outpatient follow-up.  Hyperkalemia -Likely from above.  Treated with 2 doses of Lokelma during the hospitalization.  Resolved.  Outpatient follow-up.  QTC prolongation -Presented with QTC of 525.  Avoid QTC prolonging agents.  Has already improved.  Essential hypertension -Blood pressure stable for now.    Resume home regimen.  Hyperlipidemia - continue Lipitor  Generalized weakness/physical debility -Tolerated PT/OT eval.   Discharge Instructions  Discharge Instructions    Ambulatory referral to Gastroenterology   Complete by: As directed    Diet - low sodium heart healthy   Complete by: As directed    Increase activity slowly   Complete by: As directed      Allergies as of 03/16/2020   No Known Allergies     Medication List    STOP taking these medications   aspirin EC 81 MG tablet   oxyCODONE 5 MG immediate release tablet Commonly known as: Oxy IR/ROXICODONE   senna-docusate 8.6-50 MG tablet Commonly known as: Senokot-S     TAKE these medications   amLODipine 10 MG tablet Commonly known as: NORVASC Take 1 tablet (10 mg total) by mouth daily.   atorvastatin 20 MG tablet Commonly known as: LIPITOR Take 1 tablet (20 mg total)  by mouth at bedtime.   cholecalciferol 25 MCG (1000 UNIT) tablet Commonly known as: VITAMIN D3 Take 1,000 Units by mouth daily.   ondansetron 4 MG disintegrating tablet Commonly known as: Zofran ODT Take 1 tablet (4 mg total) by mouth every 8 (eight) hours as needed for  nausea or vomiting.   pantoprazole 40 MG tablet Commonly known as: Protonix Take 1 tablet (40 mg total) by mouth 2 (two) times daily before a meal.   polyvinyl alcohol 1.4 % ophthalmic solution Commonly known as: LIQUIFILM TEARS Place 1 drop into both eyes as needed for dry eyes.   vitamin E 180 MG (400 UNITS) capsule Take 400 Units by mouth daily.      Follow-up Information    [name], MD. Schedule an appointment as soon as possible for a visit in 1 week(s).   Specialty: Internal Medicine Why: cbc/bmp  Contact information: [place] [address] [phone]        [name], MD .   Specialties: Cardiology, Radiology Contact information: [address] [place] [address] [phone]        Irene Shipper, MD. Schedule an appointment as soon as possible for a visit in 1 week(s).   Specialty: Gastroenterology Contact information: [address] [place] [address] [phone]          No Known Allergies  Consultations:  GI   Procedures/Studies: CT ABDOMEN PELVIS WO CONTRAST  Result Date: 03/13/2020 CLINICAL DATA:  Nausea vomiting. Vomiting blood. EXAM: CT ABDOMEN AND PELVIS WITHOUT CONTRAST TECHNIQUE: Multidetector CT imaging of the abdomen and pelvis was performed following the standard protocol without IV contrast. COMPARISON:  None FINDINGS: Lower chest: No consolidation or pleural effusion. Signs of mitral annular calcification and right coronary artery calcification. Heart is incompletely imaged. Hepatobiliary: Liver without focal lesion. Sludge in the gallbladder lumen. Pancreas: 7 mm area of low attenuation in the pancreatic head best seen on coronal image 38 of series 4, not clearly cystic. No ductal dilation or peripancreatic inflammation. Spleen: Spleen is normal size without focal lesion. Adrenals/Urinary Tract: Left adrenal lesion 2.4 x 1.9 cm. Thickening of the right adrenal. The areas  show very low density as low as -19 Hounsfield units. Hemorrhagic cysts in the left kidney. Renal cortical scarring bilaterally. Low-attenuation lesion in the interpolar right kidney approximately 1 cm likely a cyst. Small calculus in the upper pole the right kidney 2-3 mm. No hydronephrosis. Urinary bladder is under distended limiting assessment. Stomach/Bowel: Gastrointestinal tract without signs of acute abnormality. Normal appendix. Vascular/Lymphatic: Calcified atheromatous plaque throughout the abdominal aorta. Maximum caliber in the infrarenal segment approximately 2.2 cm. No adenopathy in the retroperitoneum. No pelvic lymphadenopathy. Reproductive: Post hysterectomy. Subtle area of added density along the anterior margin of the psoas appears to be contiguous with the ovarian vein on the right. Margins are slightly ill-defined. Other: There free air. No pneumatosis. Musculoskeletal: Spinal degenerative changes without acute or destructive bone process. IMPRESSION: No acute finding in the abdomen or pelvis. Bilateral adrenal thickening with more focal appearance on the left favored to represent asymmetric adrenal hyperplasia. Adrenal adenoma with lipid rich characteristics also considered. Biochemical correlation may be helpful. Low attenuation in the pancreatic head not meeting criteria for cyst, follow-up pancreatic protocol is suggested; there are no acute findings related to the pancreas. Postoperative changes versus small ovarian remnant on the right anterior to the right psoas muscle. Correlate with surgical history. Follow-up imaging could be performed in  3-6 months as warranted. These results were called by telephone at the time of interpretation on 03/13/2020 at 9:49 am to provider Dr. Roslynn Amble, who verbally acknowledged these results. Aortic Atherosclerosis (ICD10-I70.0). Electronically Signed   By: Zetta Bills M.D.   On: 03/13/2020 09:50       Subjective: Patient seen and examined at  bedside.  He was doing better denies any vomiting, black or bloody stools.  No overnight fever, chest pain or worsening shortness of breath.  Discharge Exam: Vitals:   03/15/20 2250 03/16/20 0629  BP: (!) 159/78 (!) 147/79  Pulse: 79 70  Resp: 18 16  Temp: 98 F (36.7 C) 97.9 F (36.6 C)  SpO2: 100% 100%    General: Pt is alert, awake, not in acute distress Cardiovascular: rate controlled, S1/S2 + Respiratory: bilateral decreased breath sounds at bases Abdominal: Soft, NT, ND, bowel sounds + Extremities: no edema, no cyanosis    The results of significant diagnostics from this hospitalization (including imaging, microbiology, ancillary and laboratory) are listed below for reference.     Microbiology: Recent Results (from the past 240 hour(s))  Respiratory Panel by RT PCR (Flu A&B, Covid) - Nasopharyngeal Swab     Status: None   Collection Time: 03/13/20 12:03 PM   Specimen: Nasopharyngeal Swab  Result Value Ref Range Status   SARS Coronavirus 2 by RT PCR NEGATIVE NEGATIVE Final    Comment: (NOTE) SARS-CoV-2 target nucleic acids are NOT DETECTED. The SARS-CoV-2 RNA is generally detectable in upper respiratoy specimens during the acute phase of infection. The lowest concentration of SARS-CoV-2 viral copies this assay can detect is 131 copies/mL. A negative result does not preclude SARS-Cov-2 infection and should not be used as the sole basis for treatment or other patient management decisions. A negative result may occur with  improper specimen collection/handling, submission of specimen other than nasopharyngeal swab, presence of viral mutation(s) within the areas targeted by this assay, and inadequate number of viral copies (<131 copies/mL). A negative result must be combined with clinical observations, patient history, and epidemiological information. The expected result is Negative. Fact Sheet for Patients:  PinkCheek.be Fact Sheet for  Healthcare Providers:  GravelBags.it This test is not yet ap proved or cleared by the [place] FDA and  has been authorized for detection and/or diagnosis of SARS-CoV-2 by FDA under an Emergency Use Authorization (EUA). This EUA will remain  in effect (meaning this test can be used) for the duration of the COVID-19 declaration under Section 564(b)(1) of the Act, 21 U.S.C. section 360bbb-3(b)(1), unless the authorization is terminated or revoked sooner.    Influenza A by PCR NEGATIVE NEGATIVE Final   Influenza B by PCR NEGATIVE NEGATIVE Final    Comment: (NOTE) The Xpert Xpress SARS-CoV-2/FLU/RSV assay is intended as an aid in  the diagnosis of influenza from Nasopharyngeal swab specimens and  should not be used as a sole basis for treatment. Nasal washings and  aspirates are unacceptable for Xpert Xpress SARS-CoV-2/FLU/RSV  testing. Fact Sheet for Patients: PinkCheek.be Fact Sheet for Healthcare Providers: GravelBags.it This test is not yet approved or cleared by the [place] FDA and  has been authorized for detection and/or diagnosis of SARS-CoV-2 by  FDA under an Emergency Use Authorization (EUA). This EUA will remain  in effect (meaning this test can be used) for the duration of the  Covid-19 declaration under Section 564(b)(1) of the Act, 21  U.S.C. section 360bbb-3(b)(1), unless the authorization is  terminated or revoked. Performed at [facility]  [place] [address] [place], [place] [address]   MRSA PCR Screening     Status: None   Collection Time: 03/13/20  6:45 PM   Specimen: Nasal Mucosa; Nasopharyngeal  Result Value Ref Range Status   MRSA by PCR NEGATIVE NEGATIVE Final    Comment:        The GeneXpert MRSA Assay (FDA approved for NASAL specimens only), is one component of a comprehensive MRSA colonization surveillance program. It is not intended to  diagnose MRSA infection nor to guide or monitor treatment for MRSA infections. Performed at [facility], [place] [address], [place], [place] [address]      Labs: BNP (last 3 results) No results for input(s): BNP in the last 8760 hours. Basic Metabolic Panel: Recent Labs  Lab 03/13/20 0822 03/13/20 0834 03/13/20 1946 03/14/20 0258 03/15/20 0238  NA 139 137  --  138 141  K 5.7* 5.6* 5.8* 5.8* 4.4  CL 107 109  --  111 112*  CO2 16*  --   --  19* 22  GLUCOSE 198* 187*  --  114* 99  BUN 97* 99*  --  73* 41*  CREATININE 2.68* 2.90*  --  2.21* 1.67*  CALCIUM 9.8  --   --  8.3* 8.8*  MG  --   --   --   --  1.6*   Liver Function Tests: Recent Labs  Lab 03/13/20 0822  AST 35  ALT 25  ALKPHOS 54  BILITOT 0.5  PROT 7.0  ALBUMIN 3.3*   No results for input(s): LIPASE, AMYLASE in the last 168 hours. No results for input(s): AMMONIA in the last 168 hours. CBC: Recent Labs  Lab 03/13/20 0822 03/13/20 0834 03/13/20 0939 03/13/20 0939 03/13/20 1054 03/13/20 1946 03/14/20 0258 03/14/20 0829 03/14/20 1844 03/15/20 0238 03/16/20 0446  WBC 6.4   < > 8.7  --  9.2  --  10.1  --   --  11.3* 9.3  NEUTROABS 3.3  --   --   --   --   --   --   --   --   --   --   HGB 9.6*   < > 4.8*   < > 5.3*   < > 7.7* 8.2* 7.5* 7.5* 7.6*  HCT 30.3*   < > 14.9*   < > 16.6*   < > 23.8* 24.3* 22.5* 22.6* 24.0*  MCV 92.4   < > 93.1  --  94.9  --  89.1  --   --  89.3 92.0  PLT 197   < > 196  --  190  --  125*  --   --  111* 116*   < > = values in this interval not displayed.   Cardiac Enzymes: No results for input(s): CKTOTAL, CKMB, CKMBINDEX, TROPONINI in the last 168 hours. BNP: Invalid input(s): POCBNP CBG: Recent Labs  Lab 03/15/20 0751 03/15/20 1203 03/15/20 1627 03/15/20 2137 03/16/20 0728  GLUCAP 93 92 111* 126* 105*   D-Dimer No results for input(s): DDIMER in the last 72 hours. Hgb A1c No results for input(s): HGBA1C in the last 72 hours. Lipid Profile No  results for input(s): CHOL, HDL, LDLCALC, TRIG, CHOLHDL, LDLDIRECT in the last 72 hours. Thyroid function studies No results for input(s): TSH, T4TOTAL, T3FREE, THYROIDAB in the last 72 hours.  Invalid input(s): FREET3 Anemia work up No results for input(s): VITAMINB12, FOLATE, FERRITIN, TIBC, IRON, RETICCTPCT in the last 72 hours.  Urinalysis    Component Value Date/Time   COLORURINE YELLOW 02/15/2020 0908   APPEARANCEUR CLEAR 02/15/2020 0908   LABSPEC 1.016 02/15/2020 0908   PHURINE 5.0 02/15/2020 0908   GLUCOSEU NEGATIVE 02/15/2020 0908   HGBUR SMALL (A) 02/15/2020 0908   BILIRUBINUR NEGATIVE 02/15/2020 0908   KETONESUR NEGATIVE 02/15/2020 0908   PROTEINUR >=300 (A) 02/15/2020 0908   NITRITE NEGATIVE 02/15/2020 0908   LEUKOCYTESUR SMALL (A) 02/15/2020 0908   Sepsis Labs Invalid input(s): PROCALCITONIN,  WBC,  LACTICIDVEN Microbiology Recent Results (from the past 240 hour(s))  Respiratory Panel by RT PCR (Flu A&B, Covid) - Nasopharyngeal Swab     Status: None   Collection Time: 03/13/20 12:03 PM   Specimen: Nasopharyngeal Swab  Result Value Ref Range Status   SARS Coronavirus 2 by RT PCR NEGATIVE NEGATIVE Final    Comment: (NOTE) SARS-CoV-2 target nucleic acids are NOT DETECTED. The SARS-CoV-2 RNA is generally detectable in upper respiratoy specimens during the acute phase of infection. The lowest concentration of SARS-CoV-2 viral copies this assay can detect is 131 copies/mL. A negative result does not preclude SARS-Cov-2 infection and should not be used as the sole basis for treatment or other patient management decisions. A negative result may occur with  improper specimen collection/handling, submission of specimen other than nasopharyngeal swab, presence of viral mutation(s) within the areas targeted by this assay, and inadequate number of viral copies (<131 copies/mL). A negative result must be combined with clinical observations, patient history, and  epidemiological information. The expected result is Negative. Fact Sheet for Patients:  PinkCheek.be Fact Sheet for Healthcare Providers:  GravelBags.it This test is not yet ap proved or cleared by the [place] FDA and  has been authorized for detection and/or diagnosis of SARS-CoV-2 by FDA under an Emergency Use Authorization (EUA). This EUA will remain  in effect (meaning this test can be used) for the duration of the COVID-19 declaration under Section 564(b)(1) of the Act, 21 U.S.C. section 360bbb-3(b)(1), unless the authorization is terminated or revoked sooner.    Influenza A by PCR NEGATIVE NEGATIVE Final   Influenza B by PCR NEGATIVE NEGATIVE Final    Comment: (NOTE) The Xpert Xpress SARS-CoV-2/FLU/RSV assay is intended as an aid in  the diagnosis of influenza from Nasopharyngeal swab specimens and  should not be used as a sole basis for treatment. Nasal washings and  aspirates are unacceptable for Xpert Xpress SARS-CoV-2/FLU/RSV  testing. Fact Sheet for Patients: PinkCheek.be Fact Sheet for Healthcare Providers: GravelBags.it This test is not yet approved or cleared by the [place] FDA and  has been authorized for detection and/or diagnosis of SARS-CoV-2 by  FDA under an Emergency Use Authorization (EUA). This EUA will remain  in effect (meaning this test can be used) for the duration of the  Covid-19 declaration under Section 564(b)(1) of the Act, 21  U.S.C. section 360bbb-3(b)(1), unless the authorization is  terminated or revoked. Performed at [facility], [place] [address] [place], [place] [address]   MRSA PCR Screening     Status: None   Collection Time: 03/13/20  6:45 PM   Specimen: Nasal Mucosa; Nasopharyngeal  Result Value Ref Range Status   MRSA by PCR NEGATIVE NEGATIVE Final    Comment:        The GeneXpert MRSA Assay  (FDA approved for NASAL specimens only), is one component of a comprehensive MRSA colonization surveillance program. It is not intended to diagnose MRSA infection nor to guide or monitor treatment for  MRSA infections. Performed at [facility], [place] [address] [place], [place] [address]      Time coordinating discharge: 35 minutes  SIGNED:   Aline August, MD  [facility] 03/16/2020, 9:20 AM

## 2020-03-17 ENCOUNTER — Encounter: Payer: Self-pay | Admitting: *Deleted

## 2020-03-17 ENCOUNTER — Other Ambulatory Visit: Payer: Self-pay

## 2020-03-17 LAB — BPAM RBC
Blood Product Expiration Date: 202104212359
Blood Product Expiration Date: 202104252359
Blood Product Expiration Date: 202104272359
ISSUE DATE / TIME: 202103251440
ISSUE DATE / TIME: 202103251440
Unit Type and Rh: 5100
Unit Type and Rh: 5100
Unit Type and Rh: 5100

## 2020-03-17 LAB — TYPE AND SCREEN
ABO/RH(D): O POS
Antibody Screen: NEGATIVE
Unit division: 0
Unit division: 0
Unit division: 0

## 2020-03-17 NOTE — Patient Outreach (Signed)
[place] [facility]) Care Management  03/17/2020  LOUVA CHACHERE 07-05-1946 [id number]     Transition of Care Referral  Referral Date: 03/17/2020 Referral Source: [facility] Discharge Report Date of Discharge: 03/16/2020 Facility: [place]   Referral received. Transition of care calls being completed via EMMI-automated calls. RN CM will outreach patient for any red flags received.      Plan: RN CM will close case at this time.    Enzo Montgomery, RN,BSN,CCM [place] [facility] Telephonic Care Management Coordinator Direct Phone: [phone] Toll Free: [phone] Fax: [phone]

## 2020-03-18 ENCOUNTER — Telehealth: Payer: Self-pay

## 2020-03-18 NOTE — Telephone Encounter (Signed)
I contacted the patient to schedule a follow up post hospital. Patient declines to schedule.  I stressed the importance of follow up in approx 2 weeks.  She again declines.  She states she will call me back if she is interested in scheduling an appt.

## 2020-03-18 NOTE — Telephone Encounter (Signed)
-----   Message from Irene Shipper, MD sent at 03/15/2020 10:42 AM EDT ----- Regarding: Follow-up Anita [name],I anticipate this patient to be discharged this weekend.  Please arrange follow-up with one of our advanced practitioners in about 2 weeks.  Diagnosis "bleeding Dieulafoy's lesion" primary patient of Dr. Carlean Purl.  ThanksJP

## 2020-03-24 ENCOUNTER — Other Ambulatory Visit: Payer: Self-pay

## 2020-03-24 NOTE — Patient Outreach (Signed)
[facility] [facility]) Care Management  03/24/2020  Anita [name] 10-16-1946 [id number]   EMMI-General Discharge RED ON EMMI ALERT Day # 4 Date: 03/21/2020 Red Alert Reason: "Scheduled follow-up? No Wounds healing well? No"   Incoming call from patient returning RN CM call. Spoke with patient. She denies any acute issues or concerns at present. RN CM reviewed and addressed red alerts. Patient reports she is aware of the MDs that she needs to follow up with. She has not had a chance yet to make appt but states she will do so soon. She denies any issues with wounds not healing. She reports she just has some bruises to her arm from IV and blood sticks. She confirms she has all her meds and no issues regarding them. Patient has completed automated EMMI post discharge calls. She voices no further RN CM needs or concerns at this time.     Plan: RN CM will close case at this time.   Enzo Montgomery, RN,BSN,CCM [place] Management Telephonic Care Management Coordinator Direct Phone: [phone] Toll Free: [phone] Fax: [phone]

## 2020-03-24 NOTE — Patient Outreach (Signed)
[facility] [facility]) Care Management  03/24/2020  Anita [name] 10-29-1946 [id number]    EMMI-General Discharge RED ON EMMI ALERT Day # 4 Date: 03/21/2020 Red Alert Reason: "Scheduled follow-up? No Wounds healing well? No"   Outreach attempt #1 to patient. No answer. RN CM left HIPAA compliant voicemail message along with contact info.     Plan: RN CM will make outreach attempt to patient within 3-4 business days. RN CM will send unsuccessful outreach letter to patient.  Enzo Montgomery, RN,BSN,CCM [place] Management Telephonic Care Management Coordinator Direct Phone: [phone] Toll Free: [phone] Fax: [phone] '

## 2020-03-26 ENCOUNTER — Telehealth: Payer: Self-pay | Admitting: *Deleted

## 2020-03-26 NOTE — Telephone Encounter (Signed)
Patient called and rescheduled her post op appt from tomorrow to next week. Patient stated she is having no issues
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
## 2020-03-27 ENCOUNTER — Inpatient Hospital Stay: Payer: Medicare HMO | Admitting: Gynecologic Oncology

## 2020-04-02 ENCOUNTER — Ambulatory Visit: Payer: Medicare HMO | Admitting: Internal Medicine

## 2020-04-03 ENCOUNTER — Other Ambulatory Visit: Payer: Self-pay

## 2020-04-03 ENCOUNTER — Inpatient Hospital Stay: Payer: Medicare HMO | Attending: Gynecologic Oncology | Admitting: Gynecologic Oncology

## 2020-04-03 ENCOUNTER — Encounter: Payer: Self-pay | Admitting: Gynecologic Oncology

## 2020-04-03 VITALS — BP 136/69 | HR 87 | Temp 98.2°F | Resp 16 | Ht 62.5 in | Wt 131.4 lb

## 2020-04-03 DIAGNOSIS — Z90722 Acquired absence of ovaries, bilateral: Secondary | ICD-10-CM | POA: Insufficient documentation

## 2020-04-03 DIAGNOSIS — Z90710 Acquired absence of both cervix and uterus: Secondary | ICD-10-CM | POA: Insufficient documentation

## 2020-04-03 DIAGNOSIS — R6889 Other general symptoms and signs: Secondary | ICD-10-CM | POA: Diagnosis not present

## 2020-04-03 DIAGNOSIS — N879 Dysplasia of cervix uteri, unspecified: Secondary | ICD-10-CM | POA: Insufficient documentation

## 2020-04-03 NOTE — Patient Instructions (Signed)
Great to see you today. You are healing well from surgery. As we talked about, I recommend that you have yearly follow-up with your gynecologist because precancer can return at the top of the vagina. I would encourage you to decrease your tobacco use or quit if you are able.
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
## 2020-04-07 ENCOUNTER — Other Ambulatory Visit: Payer: Self-pay

## 2020-04-07 ENCOUNTER — Encounter: Payer: Self-pay | Admitting: Internal Medicine

## 2020-04-07 ENCOUNTER — Ambulatory Visit (INDEPENDENT_AMBULATORY_CARE_PROVIDER_SITE_OTHER): Payer: Medicare HMO | Admitting: Internal Medicine

## 2020-04-07 VITALS — BP 144/82 | HR 80 | Temp 98.1°F | Ht 62.5 in | Wt 133.4 lb

## 2020-04-07 DIAGNOSIS — R6889 Other general symptoms and signs: Secondary | ICD-10-CM | POA: Diagnosis not present

## 2020-04-07 DIAGNOSIS — D62 Acute posthemorrhagic anemia: Secondary | ICD-10-CM | POA: Diagnosis not present

## 2020-04-07 DIAGNOSIS — K922 Gastrointestinal hemorrhage, unspecified: Secondary | ICD-10-CM | POA: Diagnosis not present

## 2020-04-07 LAB — VITAMIN B12: Vitamin B-12: 322 pg/mL (ref 211–911)

## 2020-04-07 LAB — CBC
HCT: 30.4 % — ABNORMAL LOW (ref 36.0–46.0)
Hemoglobin: 10.2 g/dL — ABNORMAL LOW (ref 12.0–15.0)
MCHC: 33.4 g/dL (ref 30.0–36.0)
MCV: 84.3 fl (ref 78.0–100.0)
Platelets: 241 10*3/uL (ref 150.0–400.0)
RBC: 3.61 Mil/uL — ABNORMAL LOW (ref 3.87–5.11)
RDW: 19.3 % — ABNORMAL HIGH (ref 11.5–15.5)
WBC: 8.5 10*3/uL (ref 4.0–10.5)

## 2020-04-07 LAB — COMPREHENSIVE METABOLIC PANEL
ALT: 18 U/L (ref 0–35)
AST: 22 U/L (ref 0–37)
Albumin: 4.2 g/dL (ref 3.5–5.2)
Alkaline Phosphatase: 93 U/L (ref 39–117)
BUN: 25 mg/dL — ABNORMAL HIGH (ref 6–23)
CO2: 24 mEq/L (ref 19–32)
Calcium: 10 mg/dL (ref 8.4–10.5)
Chloride: 107 mEq/L (ref 96–112)
Creatinine, Ser: 1.4 mg/dL — ABNORMAL HIGH (ref 0.40–1.20)
GFR: 44.77 mL/min — ABNORMAL LOW (ref >60.00)
Glucose, Bld: 71 mg/dL (ref 70–99)
Potassium: 5.2 mEq/L — ABNORMAL HIGH (ref 3.5–5.1)
Sodium: 137 mEq/L (ref 135–145)
Total Bilirubin: 0.3 mg/dL (ref 0.2–1.2)
Total Protein: 8.1 g/dL (ref 6.0–8.3)

## 2020-04-07 LAB — VITAMIN D 25 HYDROXY (VIT D DEFICIENCY, FRACTURES): VITD: 29.52 ng/mL — ABNORMAL LOW (ref 30.00–100.00)

## 2020-04-07 LAB — FERRITIN: Ferritin: 22.3 ng/mL (ref 10.0–291.0)

## 2020-04-07 NOTE — Assessment & Plan Note (Signed)
Advised to continue protonix BID until GI follow up tomorrow. Continue iron. Checking CBC, ferritin, B12, vitamin D today and replete as needed. No signs of continued GI bleeding. Informed that GI will determine if she needs repeat EGD, ongoing protonix therapy.

## 2020-04-07 NOTE — Patient Instructions (Signed)
We will check the labs today and you are seeing the GI doctor tomorrow.

## 2020-04-07 NOTE — Progress Notes (Signed)
   Subjective:   Patient ID: Anita [name], female    DOB: 03/01/1946, 74 y.o.   MRN: [id number]  HPI The patient is a 74 YO female coming in for hospital follow up (in for upper GI bleeding with EGD done found bleeding Dieulafoy's lesion). She has been contacted by GI to schedule follow up and is seeing them tomorrow. She has been doing well since leaving hospital. She was advised to see [place] about 1 week after discharge and left hospital approx 3 weeks ago. She had 2 units PRBC in hospital. She does have polyps in the past and is due for colonoscopy with GI for follow up. She is taking protonix BID and iron. Denies constipation. Denies blood in stool. Denies nausea or vomiting. Denies vomiting blood. Denies abdominal pain.   PMH, [facility], social history reviewed and updated.   Review of Systems  Constitutional: Negative.   HENT: Negative.   Eyes: Negative.   Respiratory: Negative for cough, chest tightness and shortness of breath.   Cardiovascular: Negative for chest pain, palpitations and leg swelling.  Gastrointestinal: Negative for abdominal distention, abdominal pain, constipation, diarrhea, nausea and vomiting.  Musculoskeletal: Negative.   Skin: Negative.   Neurological: Negative.   Psychiatric/Behavioral: Negative.     Objective:  Physical Exam Constitutional:      Appearance: She is well-developed.  HENT:     Head: Normocephalic and atraumatic.  Cardiovascular:     Rate and Rhythm: Normal rate and regular rhythm.  Pulmonary:     Effort: Pulmonary effort is normal. No respiratory distress.     Breath sounds: Normal breath sounds. No wheezing or rales.  Abdominal:     General: Bowel sounds are normal. There is no distension.     Palpations: Abdomen is soft.     Tenderness: There is no abdominal tenderness. There is no rebound.  Musculoskeletal:     Cervical back: Normal range of motion.  Skin:    General: Skin is warm and dry.  Neurological:     Mental Status: She is  alert and oriented to person, place, and time.     Coordination: Coordination normal.     Vitals:   04/07/20 1046  BP: (!) 144/82  Pulse: 80  Temp: 98.1 F (36.7 C)  SpO2: 98%  Weight: 133 lb 6.4 oz (60.5 kg)  Height: 5' 2.5" (1.588 m)    This visit occurred during the SARS-CoV-2 public health emergency.  Safety protocols were in place, including screening questions prior to the visit, additional usage of staff PPE, and extensive cleaning of exam room while observing appropriate contact time as indicated for disinfecting solutions.   Assessment & Plan:

## 2020-04-07 NOTE — Assessment & Plan Note (Signed)
Checking CBC and ferritin. Taking iron currently. Adjust as needed.

## 2020-04-08 ENCOUNTER — Encounter: Payer: Self-pay | Admitting: Nurse Practitioner

## 2020-04-08 ENCOUNTER — Ambulatory Visit: Payer: Medicare HMO | Admitting: Nurse Practitioner

## 2020-04-08 VITALS — BP 138/68 | HR 75 | Temp 97.8°F | Ht 62.5 in | Wt 132.0 lb

## 2020-04-08 DIAGNOSIS — D62 Acute posthemorrhagic anemia: Secondary | ICD-10-CM | POA: Diagnosis not present

## 2020-04-08 DIAGNOSIS — K922 Gastrointestinal hemorrhage, unspecified: Secondary | ICD-10-CM | POA: Diagnosis not present

## 2020-04-08 DIAGNOSIS — R6889 Other general symptoms and signs: Secondary | ICD-10-CM | POA: Diagnosis not present

## 2020-04-08 NOTE — Patient Instructions (Addendum)
If you are age 72 or older, your body mass index should be between 23-30. Your Body mass index is 23.76 kg/m. If this is out of the aforementioned range listed, please consider follow up with your Primary Care Provider.  If you are age 64 or younger, your body mass index should be between 19-25. Your Body mass index is 23.76 kg/m. If this is out of the aformentioned range listed, please consider follow up with your Primary Care Provider.   Your provider has requested that you go to the basement level for lab work before leaving today. Press "B" on the elevator. The lab is located at the first door on the left as you exit the elevator.  1. Follow up with Dr Carlean Purl in 3 months- this will be July 2021. Please call [phone] to schedule this 2. Continue taking pantoprazole 40 mg tablets twice a day. 3.Continue taking iron-Ferrous Sulfate 325 mg daily 4. Please contact our office if you have Nausea and Vomiting, abdominal pain and black stool.    Due to recent changes in healthcare laws, you may see the results of your imaging and laboratory studies on MyChart before your provider has had a chance to review them.  We understand that in some cases there may be results that are confusing or concerning to you. Not all laboratory results come back in the same time frame and the provider may be waiting for multiple results in order to interpret others.  Please give [place] 48 hours in order for your provider to thoroughly review all the results before contacting the office for clarification of your results.    Thank you for choosing [facility] Noralyn Pick, CRNP

## 2020-04-08 NOTE — Progress Notes (Signed)
04/08/2020 Anita [name] [id number] 1946/11/15   Chief Complaint: Hospital follow up for GI bleed, anemia   History of Present Illness: Anita [name] is a 74 y.o. female with a past medical history of arthritis, hypertension, hyperlipidemia, colon polyps, cervical cancer status post hysterectomy 02/19/2020 and CKD.  She was admitted to [facility] on 03/13/2020 with hematemesis. Labs in the ED show profound anemia. Hg 9.6 down to 5.3. BUN 97. Cr. 2.9. She received 2 units of PRBCs and her Hg increased to 9.9. She underwent an EGD by Dr. Henrene Pastor 3/25 which showed a massive UGI bleed secondary to a dieulafoy lesion which was clipped which resulted in aggressive bleeding. She became hypoxic and the procedure was briefly halted, she was intubated and the bleeding area was successfully injected with epi for hemostasis. She was placed on PPI infusion. No further bleeding post EGD. She had hyperkalemia which resolved after receiving 2 doses of Lokelma. Her Hg stabilized at 7.6 and she was discharged home on 03/16/2020. She was prescribed Pantoprazole 40mg  po bid. She presents today for further follow up. Repeat CBC 4/19 showed Hg 10.2 (which is back to her baseline). She reports feeling fatigued. No chest pain or SOB. No heartburn or upper abdominal pain. No N/V. No hematemesis. She is having occasional cramping to her right and left sides and back, no severe pain. No dysuria or hematuria. She is passing a normal formed brown BM daily. No rectal bleeding or black stools. Appetite is fair. No weight loss. No other complaints today.    CBC Latest Ref Rng & Units 04/07/2020 03/16/2020 03/15/2020  WBC 4.0 - 10.5 K/uL 8.5 9.3 11.3(H)  Hemoglobin 12.0 - 15.0 g/dL 10.2(L) 7.6(L) 7.5(L)  Hematocrit 36.0 - 46.0 % 30.4(L) 24.0(L) 22.6(L)  Platelets 150.0 - 400.0 K/uL 241.0 116(L) 111(L)   CMP Latest Ref Rng & Units 04/07/2020 03/16/2020 03/15/2020  Glucose 70 - 99 mg/dL 71 123(H) 99  BUN 6 - 23 mg/dL 25(H) 28(H) 41(H)    Creatinine 0.40 - 1.20 mg/dL 1.40(H) 1.62(H) 1.67(H)  Sodium 135 - 145 mEq/L 137 135 141  Potassium 3.5 - 5.1 mEq/L 5.2 No hemolysis seen(H) 4.0 4.4  Chloride 96 - 112 mEq/L 107 107 112(H)  CO2 19 - 32 mEq/L 24 18(L) 22  Calcium 8.4 - 10.5 mg/dL 10.0 8.8(L) 8.8(L)  Total Protein 6.0 - 8.3 g/dL 8.1 - -  Total Bilirubin 0.2 - 1.2 mg/dL 0.3 - -  Alkaline Phos 39 - 117 U/L 93 - -  AST 0 - 37 U/L 22 - -  ALT 0 - 35 U/L 18 - -    Abdominal/pelvic CT wo contrast 03/13/2020:  No acute finding in the abdomen or pelvis. Bilateral adrenal thickening with more focal appearance on the left favored to represent asymmetric adrenal hyperplasia. Adrenal adenoma with lipid rich characteristics also considered. Biochemical correlation may be helpful. Low attenuation in the pancreatic head not meeting criteria for cyst, follow-up pancreatic protocol is suggested; there are no acute findings related to the pancreas. Postoperative changes versus small ovarian remnant on the right anterior to the right psoas muscle. Correlate with surgical history. Aortic Atherosclerosis    EGD 03/13/2020 by Dr. Henrene Pastor due to UGI bleed/hematemsis: The esophagus revealed a distal esophageal ring. No Mallory-Weiss tear. There was a large volume of fresh blood and dense clots in the proximal stomach. The distal stomach was cleared of any lesions. With painstaking effort and time the clots and blood were eventually cleared. There was  one area that look like a flat AVM which was injected with epinephrine and treated with bipolar probe. However, adjacent to this was a subtle but classic Dieulafoy lesion which was nonbleeding. This was clipped and aggressive bleeding ensued. Patient had transient hypoxia and the procedure was temporarily halted. She was intubated. She was rescoped.. Area was successfully injected with 8 mL of a 1:10,000 solution of epinephrine for hemostasis. For hemostasis, three hemostatic clips  were successfully placed. There was no bleeding at the end of the procedure. Stomach was otherwise normal. Small hiatal hernia The examined duodenum was normal. The cardia and gastric fundus were normal on retroflexion.  Colonoscopy 02/27/2015 by Dr. Carlean Purl: 1. Six sessile (ademoa and hyperplastic) polyps ranging from 2 to 82mm in size were found in the rectum, sigmoid colon, and descending colon; polypectomies were performed with a cold snare 1 rectal and the sigmoid polyp not recovered. 2. The examination was otherwise normal Recall colonoscopy 02/2020  . Current Outpatient Medications on File Prior to Visit  Medication Sig Dispense Refill  . amLODipine (NORVASC) 10 MG tablet Take 1 tablet (10 mg total) by mouth daily. 90 tablet 3  . atorvastatin (LIPITOR) 20 MG tablet Take 1 tablet (20 mg total) by mouth at bedtime.    . calcium-vitamin D (OSCAL WITH D) 250-125 MG-UNIT tablet Take 1 tablet by mouth daily.    . cholecalciferol (VITAMIN D3) 25 MCG (1000 UNIT) tablet Take 1,000 Units by mouth daily.    . ferrous sulfate 325 (65 FE) MG tablet Take 325 mg by mouth daily with breakfast.    . pantoprazole (PROTONIX) 40 MG tablet Take 1 tablet (40 mg total) by mouth 2 (two) times daily before a meal. 60 tablet 0  . vitamin E 180 MG (400 UNITS) capsule Take 400 Units by mouth daily.     No current facility-administered medications on file prior to visit.        Current Medications, Allergies, Past Medical History, Past Surgical History, Family History and Social History were reviewed in [employer] record.   Physical Exam: BP 138/68   Pulse 75   Temp 97.8 F (36.6 C)   Ht 5' 2.5" (1.588 m)   Wt 132 lb (59.9 kg)   BMI 23.76 kg/m  General: Well developed 74 year female in no acute distress. Head: Normocephalic and atraumatic. Eyes: No scleral icterus. Conjunctiva pink . Ears: Normal auditory acuity. Mouth: Absent dentition. No ulcers or lesions.  Lungs:  Clear throughout to auscultation. Heart: Regular rate and rhythm. 2/6 systolic murmur.  Abdomen: Soft, nontender and nondistended. No masses or hepatomegaly. Normal bowel sounds x 4 quadrants. Upper abdominal scars intact.  Rectal: Deferred.  Musculoskeletal: Symmetrical with no gross deformities. Extremities: No edema. Neurological: Alert oriented x 4. No focal deficits.  Psychological: Alert and cooperative. Normal mood and affect  Assessment and Recommendations:   41. 74 year old female with UGI bleed/hematemesis due to a dieulafoy lesion s/p EGD 3/25 -Repeat CBC, iron panel, BMP in 4 weeks  -Continue Pantoprazole 40mg  bid for now -Patient to call our office if she develops any N/V, hematemesis or worsening abdominal/side pain.   2. History of colon polyps  -Recall colonoscopy was due 02/2020, to further discuss scheduling a colonoscopy at the time of her follow up appointment with  Dr. Carlean Purl in 3 months  3. CTAP showed low attenuation to the head of the pancreas. Patient does not wish to have a MRI.  -Discuss further with Dr. Carlean Purl  4. CKD stage III. Cr. 1.40.   5. Chronic normocytic anemia secondary to ? CKD.

## 2020-04-15 ENCOUNTER — Telehealth: Payer: Self-pay | Admitting: Internal Medicine

## 2020-04-15 MED ORDER — AMLODIPINE BESYLATE 10 MG PO TABS: 10.0000 mg | ORAL_TABLET | Freq: Every day | ORAL | 3 refills | Status: DC

## 2020-04-15 MED ORDER — ATORVASTATIN CALCIUM 20 MG PO TABS: 20.0000 mg | ORAL_TABLET | Freq: Every day | ORAL | 2 refills | Status: DC

## 2020-04-15 NOTE — Telephone Encounter (Signed)
  Patient is requesting Dr Sharlet Salina write order for Lipitor  1.Medication Requested: atorvastatin (LIPITOR) 20 MG tablet  2. Pharmacy (Name, Street, [place]): [facility]  3. On Med List: yes  4. Last Visit with PCP: 04/07/20  5. Next visit date with PCP:   Agent: Please be advised that RX refills may take up to 3 business days. We ask that you follow-up with your pharmacy.

## 2020-04-16 ENCOUNTER — Other Ambulatory Visit: Payer: Self-pay

## 2020-04-16 MED ORDER — ATORVASTATIN CALCIUM 20 MG PO TABS: 20.0000 mg | ORAL_TABLET | Freq: Every day | ORAL | 3 refills | Status: DC

## 2020-04-17 NOTE — Telephone Encounter (Signed)
Requested refill sent to [facility]

## 2020-04-18 ENCOUNTER — Other Ambulatory Visit: Payer: Self-pay

## 2020-04-22 ENCOUNTER — Ambulatory Visit: Payer: Medicare HMO | Admitting: Cardiovascular Disease

## 2020-04-22 ENCOUNTER — Encounter: Payer: Self-pay | Admitting: Cardiovascular Disease

## 2020-04-22 ENCOUNTER — Other Ambulatory Visit: Payer: Self-pay

## 2020-04-22 ENCOUNTER — Other Ambulatory Visit: Payer: Self-pay | Admitting: Cardiovascular Disease

## 2020-04-22 VITALS — BP 140/74 | HR 82 | Ht 62.5 in | Wt 131.4 lb

## 2020-04-22 DIAGNOSIS — I739 Peripheral vascular disease, unspecified: Secondary | ICD-10-CM

## 2020-04-22 DIAGNOSIS — I779 Disorder of arteries and arterioles, unspecified: Secondary | ICD-10-CM | POA: Insufficient documentation

## 2020-04-22 DIAGNOSIS — I10 Essential (primary) hypertension: Secondary | ICD-10-CM | POA: Diagnosis not present

## 2020-04-22 DIAGNOSIS — E782 Mixed hyperlipidemia: Secondary | ICD-10-CM

## 2020-04-22 DIAGNOSIS — I6522 Occlusion and stenosis of left carotid artery: Secondary | ICD-10-CM

## 2020-04-22 DIAGNOSIS — I359 Nonrheumatic aortic valve disorder, unspecified: Secondary | ICD-10-CM

## 2020-04-22 DIAGNOSIS — E7800 Pure hypercholesterolemia, unspecified: Secondary | ICD-10-CM

## 2020-04-22 DIAGNOSIS — Z720 Tobacco use: Secondary | ICD-10-CM

## 2020-04-22 DIAGNOSIS — E785 Hyperlipidemia, unspecified: Secondary | ICD-10-CM | POA: Insufficient documentation

## 2020-04-22 DIAGNOSIS — R011 Cardiac murmur, unspecified: Secondary | ICD-10-CM | POA: Diagnosis not present

## 2020-04-22 DIAGNOSIS — R6889 Other general symptoms and signs: Secondary | ICD-10-CM | POA: Diagnosis not present

## 2020-04-22 MED ORDER — ATORVASTATIN CALCIUM 40 MG PO TABS: 40.0000 mg | ORAL_TABLET | Freq: Every day | ORAL | 3 refills | Status: DC

## 2020-04-22 NOTE — Assessment & Plan Note (Signed)
Patient hyperlipidemia on atorvastatin 20 mg a day with lipid profile performed 11/28/2019 Total cholesterol 198, LDL 102 and HDL 72.  I am going to increase her atorvastatin from 20 to 40 mg a day and we will recheck a lipid liver profile in 2 months.

## 2020-04-22 NOTE — Patient Instructions (Signed)
Medication Instructions:  INCREASE ATORVASTATIN TO 40 MG ONCE DAILY= 2 OF THE 20 MG ONCE DAILY  *If you need a refill on your cardiac medications before your next appointment, please call your pharmacy*   Lab Work: Your physician recommends that you return for lab work in:2 [place]  If you have labs (blood work) drawn today and your tests are completely normal, you will receive your results only by: Marland Kitchen MyChart Message (if you have MyChart) OR . A paper copy in the mail If you have any lab test that is abnormal or we need to change your treatment, we will call you to review the results.   Testing/Procedures: Your physician has requested that you have a carotid duplex. This test is an ultrasound of the carotid arteries in your neck. It looks at blood flow through these arteries that supply the brain with blood. Allow one hour for this exam. There are no restrictions or special instructions.SCHEDULE IN January 2022  Your physician has requested that you have an echocardiogram. Echocardiography is a painless test that uses sound waves to create images of your heart. It provides your doctor with information about the size and shape of your heart and how well your heart's chambers and valves are working. This procedure takes approximately one hour. There are no restrictions for this procedure.[place] January 2022    Follow-Up: At [facility], you and your health needs are our priority.  As part of our continuing mission to provide you with exceptional heart care, we have created designated Provider Care Teams.  These Care Teams include your primary Cardiologist (physician) and Advanced Practice Providers (APPs -  Physician Assistants and Nurse Practitioners) who all work together to provide you with the care you need, when you need it.  We recommend signing up for the patient portal called "MyChart".  Sign up information is provided on this After Visit  Summary.  MyChart is used to connect with patients for Virtual Visits (Telemedicine).  Patients are able to view lab/test results, encounter notes, upcoming appointments, etc.  Non-urgent messages can be sent to your provider as well.   To learn more about what you can do with MyChart, go to [email or web address].    Your next appointment:    Your physician wants you to follow-up in: [place] will receive a reminder letter in the mail two months in advance. If you don't receive a letter, please call our office to schedule the follow-up appointment.   Your physician wants you to follow-up in: [place] will receive a reminder letter in the mail two months in advance. If you don't receive a letter, please call our office to schedule the follow-up appointment.

## 2020-04-22 NOTE — Assessment & Plan Note (Signed)
Patient ongoing tobacco abuse of 5 cigarettes a day recalcitrant to risk factor modification

## 2020-04-22 NOTE — Progress Notes (Signed)
04/22/2020 Anita [name]   05/21/1946  [id number]  Primary Physician [name], MD Primary Cardiologist: [name] MD Anita [name], [place]  HPI:  Anita [name] is a 74 y.o.  mildly overweight divorced African-American female mother of 2 children, grandmother of 3 grandchildren referred by Dr. Sharlet Salina, her PCP, for cardiovascular valuation because of risk factors.  I last saw her in the office 12/18/2019. She is retired from working in accounts payable.  Risk factors include tobacco abuse having smoked 50 pack years currently smoking 1/2 pack/day recalcitrant to respect modification.  She does have treated hypertension as well as family history with a mother who had a stent.  She is never had a heart attack or stroke.  She denies chest pain or shortness of breath.  She does have bilateral calf claudication with abnormal Doppler studies performed 07/11/2018, followed by Dr. Trula Slade.  Her most recent lipid profile performed 11/28/2019 revealed total cholesterol 198, LDL 102 and HDL 72.  Since I saw her she is done well.  She denies chest pain, shortness of breath or claudication.  She did have a 2D echocardiogram performed 12/26/2019 that showed aortic sclerosis without stenosis and carotid Doppler studies performed the same time that showed moderate left ICA stenosis.   Current Meds  Medication Sig  . amLODipine (NORVASC) 10 MG tablet Take 1 tablet (10 mg total) by mouth daily.  Marland Kitchen atorvastatin (LIPITOR) 40 MG tablet Take 1 tablet (40 mg total) by mouth at bedtime.  . calcium-vitamin D (OSCAL WITH D) 250-125 MG-UNIT tablet Take 1 tablet by mouth daily.  . cholecalciferol (VITAMIN D3) 25 MCG (1000 UNIT) tablet Take 1,000 Units by mouth daily.  . ferrous sulfate 325 (65 FE) MG tablet Take 325 mg by mouth daily with breakfast.  . pantoprazole (PROTONIX) 40 MG tablet Take 1 tablet (40 mg total) by mouth 2 (two) times daily before a meal.  . vitamin E 180 MG (400 UNITS)  capsule Take 400 Units by mouth daily.  . [DISCONTINUED] atorvastatin (LIPITOR) 20 MG tablet Take 1 tablet (20 mg total) by mouth at bedtime.     No Known Allergies  Social History   Socioeconomic History  . Marital status: Single    Spouse name: Not on file  . Number of children: 2  . Years of education: 12+  . Highest education level: Not on file  Occupational History  . Occupation: Retired  Tobacco Use  . Smoking status: Current Every Day Smoker    Packs/day: 0.50    Years: 59.00    Pack years: 29.50    Types: Cigarettes  . Smokeless tobacco: Never Used  . Tobacco comment: since age 15  Substance and Sexual Activity  . Alcohol use: Not Currently    Alcohol/week: 0.0 standard drinks    Comment: socially  . Drug use: Never  . Sexual activity: Not Currently    Birth control/protection: Post-menopausal, Surgical  Other Topics Concern  . Not on file  Social History Narrative   Lives at home with her mother.   Right-handed.   Several sodas per day.   Social Determinants of Health   Financial Resource Strain:   . Difficulty of Paying Living Expenses:   Food Insecurity:   . Worried About Charity fundraiser in the Last Year:   . Arboriculturist in the Last Year:   Transportation Needs:   . Film/video editor (Medical):   Marland Kitchen Lack of Transportation (  Non-Medical):   Physical Activity:   . Days of Exercise per Week:   . Minutes of Exercise per Session:   Stress:   . Feeling of Stress :   Social Connections:   . Frequency of Communication with Friends and Family:   . Frequency of Social Gatherings with Friends and Family:   . Attends Religious Services:   . Active Member of Clubs or Organizations:   . Attends Archivist Meetings:   Marland Kitchen Marital Status:   Intimate Partner Violence:   . Fear of Current or Ex-Partner:   . Emotionally Abused:   Marland Kitchen Physically Abused:   . Sexually Abused:      Review of Systems: General: negative for chills, fever, night  sweats or weight changes.  Cardiovascular: negative for chest pain, dyspnea on exertion, edema, orthopnea, palpitations, paroxysmal nocturnal dyspnea or shortness of breath Dermatological: negative for rash Respiratory: negative for cough or wheezing Urologic: negative for hematuria Abdominal: negative for nausea, vomiting, diarrhea, bright red blood per rectum, melena, or hematemesis Neurologic: negative for visual changes, syncope, or dizziness All other systems reviewed and are otherwise negative except as noted above.    Blood pressure 140/74, pulse 82, height 5' 2.5" (1.588 m), weight 131 lb 6.4 oz (59.6 kg), SpO2 97 %.  General appearance: alert and no distress Neck: no adenopathy, no JVD, supple, symmetrical, trachea midline, thyroid not enlarged, symmetric, no tenderness/mass/nodules and Bilateral carotid bruits Lungs: clear to auscultation bilaterally Heart: To her sick systolic ejection murmur at the base consistent with aortic stenosis and/or sclerosis. Extremities: extremities normal, atraumatic, no cyanosis or edema Pulses: 2+ and symmetric Skin: Skin color, texture, turgor normal. No rashes or lesions Neurologic: Alert and oriented X 3, normal strength and tone. Normal symmetric reflexes. Normal coordination and gait  EKG not performed today  ASSESSMENT AND PLAN:   Claudication [facility]) History of PAD with Dopplers performed 2019 that were abnormal followed by Dr. Trula Slade  Hypertension History of essential hypertension blood pressure measured today 140/74.  She is on amlodipine.  Tobacco abuse Patient ongoing tobacco abuse of 5 cigarettes a day recalcitrant to risk factor modification  Hyperlipidemia Patient hyperlipidemia on atorvastatin 20 mg a day with lipid profile performed 11/28/2019 Total cholesterol 198, LDL 102 and HDL 72.  I am going to increase her atorvastatin from 20 to 40 mg a day and we will recheck a lipid liver profile in 2 months.  Carotid artery  disease (HCC) Moderate left ICA stenosis by duplex ultrasound 12/26/2019.  We will repeat carotid Dopplers in January next year.  Cardiac murmur 2/6 systolic ejection murmur at the base consistent with an outflow tract murmur.  She did have 2D echo performed 12/26/2019 that showed aortic sclerosis without stenosis.  Get a repeat 2D echo in 1 year.      [name] MD FACP,FACC,FAHA, [facility] 04/22/2020 4:16 PM

## 2020-04-22 NOTE — Assessment & Plan Note (Signed)
History of essential hypertension blood pressure measured today 140/74.  She is on amlodipine.

## 2020-04-22 NOTE — Assessment & Plan Note (Signed)
History of PAD with Dopplers performed 2019 that were abnormal followed by Dr. Trula Slade

## 2020-04-22 NOTE — Telephone Encounter (Signed)
*  STAT* If patient is at the pharmacy, call can be transferred to refill team.   1. Which medications need to be refilled? (please list name of each medication and dose if known) atorvastatin (LIPITOR) 20 MG tablet  2. Which pharmacy/location (including street and city if local pharmacy) is medication to be sent to? [place], [place]  3. Do they need a 30 day or 90 day supply? 90 Day

## 2020-04-22 NOTE — Assessment & Plan Note (Signed)
2/6 systolic ejection murmur at the base consistent with an outflow tract murmur.  She did have 2D echo performed 12/26/2019 that showed aortic sclerosis without stenosis.  Get a repeat 2D echo in 1 year.

## 2020-04-22 NOTE — Assessment & Plan Note (Signed)
Moderate left ICA stenosis by duplex ultrasound 12/26/2019.  We will repeat carotid Dopplers in January next year.

## 2020-04-23 ENCOUNTER — Other Ambulatory Visit: Payer: Self-pay

## 2020-04-28 ENCOUNTER — Other Ambulatory Visit: Payer: Self-pay

## 2020-04-28 MED ORDER — PANTOPRAZOLE SODIUM 40 MG PO TBEC: 40.0000 mg | DELAYED_RELEASE_TABLET | Freq: Two times a day (BID) | ORAL | 2 refills | Status: DC

## 2020-04-28 NOTE — Telephone Encounter (Signed)
Pantoprazole refilled. 

## 2020-05-05 ENCOUNTER — Telehealth: Payer: Self-pay | Admitting: Nurse Practitioner

## 2020-05-05 ENCOUNTER — Telehealth: Payer: Self-pay | Admitting: Cardiovascular Disease

## 2020-05-05 ENCOUNTER — Other Ambulatory Visit: Payer: Self-pay

## 2020-05-05 MED ORDER — PANTOPRAZOLE SODIUM 40 MG PO TBEC: 40.0000 mg | DELAYED_RELEASE_TABLET | Freq: Two times a day (BID) | ORAL | 0 refills | Status: DC

## 2020-05-05 NOTE — Telephone Encounter (Signed)
Pantoprazole refilled as [facility] requested.

## 2020-05-05 NOTE — Telephone Encounter (Signed)
  *  STAT* If patient is at the pharmacy, call can be transferred to refill team.   1. Which medications need to be refilled? (please list name of each medication and dose if known)   atorvastatin (LIPITOR) 40 MG tablet    2. Which pharmacy/location (including street and city if local pharmacy) is medication to be sent to? [place], [place]  3. Do they need a 30 day or 90 day supply? 90 days  Pt provided [place] fax# [phone] and phone # [phone]

## 2020-05-05 NOTE — Telephone Encounter (Signed)
Spoke with the patient. She was hoping to not have to have labs in 2 different places. The cardiologist is checking lipids. We are checking iron panel and metabolic panel. Explained this to the patient this means she will need to have  Labs done for both doctors as scheduled.

## 2020-05-08 ENCOUNTER — Other Ambulatory Visit (INDEPENDENT_AMBULATORY_CARE_PROVIDER_SITE_OTHER): Payer: Medicare HMO

## 2020-05-08 DIAGNOSIS — K922 Gastrointestinal hemorrhage, unspecified: Secondary | ICD-10-CM

## 2020-05-08 DIAGNOSIS — D62 Acute posthemorrhagic anemia: Secondary | ICD-10-CM | POA: Diagnosis not present

## 2020-05-08 DIAGNOSIS — R6889 Other general symptoms and signs: Secondary | ICD-10-CM | POA: Diagnosis not present

## 2020-05-08 LAB — BASIC METABOLIC PANEL
BUN: 48 mg/dL — ABNORMAL HIGH (ref 6–23)
CO2: 20 mEq/L (ref 19–32)
Calcium: 10.1 mg/dL (ref 8.4–10.5)
Chloride: 113 mEq/L — ABNORMAL HIGH (ref 96–112)
Creatinine, Ser: 1.57 mg/dL — ABNORMAL HIGH (ref 0.40–1.20)
GFR: 39.22 mL/min — ABNORMAL LOW (ref >60.00)
Glucose, Bld: 145 mg/dL — ABNORMAL HIGH (ref 70–99)
Potassium: 5.1 mEq/L (ref 3.5–5.1)
Sodium: 137 mEq/L (ref 135–145)

## 2020-05-08 LAB — IBC + FERRITIN
Ferritin: 43.1 ng/mL (ref 10.0–291.0)
Iron: 53 ug/dL (ref 42–145)
Saturation Ratios: 10.5 % — ABNORMAL LOW (ref 20.0–50.0)
Transferrin: 361 mg/dL — ABNORMAL HIGH (ref 212.0–360.0)

## 2020-05-08 LAB — CBC
HCT: 34.7 % — ABNORMAL LOW (ref 36.0–46.0)
Hemoglobin: 11.5 g/dL — ABNORMAL LOW (ref 12.0–15.0)
MCHC: 33 g/dL (ref 30.0–36.0)
MCV: 85.6 fl (ref 78.0–100.0)
Platelets: 191 10*3/uL (ref 150.0–400.0)
RBC: 4.06 Mil/uL (ref 3.87–5.11)
RDW: 19.8 % — ABNORMAL HIGH (ref 11.5–15.5)
WBC: 7.5 10*3/uL (ref 4.0–10.5)

## 2020-05-20 DIAGNOSIS — R6889 Other general symptoms and signs: Secondary | ICD-10-CM | POA: Diagnosis not present

## 2020-05-20 DIAGNOSIS — E7800 Pure hypercholesterolemia, unspecified: Secondary | ICD-10-CM | POA: Diagnosis not present

## 2020-05-21 LAB — HEPATIC FUNCTION PANEL
ALT: 16 IU/L (ref 0–32)
AST: 21 IU/L (ref 0–40)
Albumin: 4.5 g/dL (ref 3.7–4.7)
Alkaline Phosphatase: 106 IU/L (ref 48–121)
Bilirubin Total: 0.2 mg/dL (ref 0.0–1.2)
Bilirubin, Direct: 0.07 mg/dL (ref 0.00–0.40)
Total Protein: 8.1 g/dL (ref 6.0–8.5)

## 2020-05-21 LAB — LIPID PANEL
Chol/HDL Ratio: 2 ratio (ref 0.0–4.4)
Cholesterol, Total: 138 mg/dL (ref 100–199)
HDL: 68 mg/dL (ref >39)
LDL Chol Calc (NIH): 57 mg/dL (ref 0–99)
Triglycerides: 60 mg/dL (ref 0–149)
VLDL Cholesterol Cal: 13 mg/dL (ref 5–40)

## 2020-06-12 ENCOUNTER — Encounter: Payer: Self-pay | Admitting: Internal Medicine

## 2020-06-12 ENCOUNTER — Ambulatory Visit (INDEPENDENT_AMBULATORY_CARE_PROVIDER_SITE_OTHER): Payer: Medicare HMO | Admitting: Internal Medicine

## 2020-06-12 ENCOUNTER — Other Ambulatory Visit: Payer: Self-pay

## 2020-06-12 VITALS — BP 138/88 | HR 92 | Temp 98.3°F | Ht 62.5 in | Wt 135.0 lb

## 2020-06-12 DIAGNOSIS — L729 Follicular cyst of the skin and subcutaneous tissue, unspecified: Secondary | ICD-10-CM

## 2020-06-12 DIAGNOSIS — R6889 Other general symptoms and signs: Secondary | ICD-10-CM | POA: Diagnosis not present

## 2020-06-12 MED ORDER — SULFAMETHOXAZOLE-TRIMETHOPRIM 800-160 MG PO TABS
1.0000 | ORAL_TABLET | Freq: Two times a day (BID) | ORAL | 0 refills | Status: DC
Start: 2020-06-12 — End: 2020-07-23

## 2020-06-12 MED ORDER — SULFAMETHOXAZOLE-TRIMETHOPRIM 800-160 MG PO TABS: 1.0000 | ORAL_TABLET | Freq: Two times a day (BID) | ORAL | 0 refills | Status: DC

## 2020-06-12 NOTE — Assessment & Plan Note (Signed)
On left back. Purulent drainage expressible. Referral to dermatology done for removal so this does not get inflamed again. Rx bactrim 5 day course for infection today.

## 2020-06-12 NOTE — Patient Instructions (Signed)
We will get you in with the dermatologist to get that cyst removed.   We have drained it today and sent in bactrim to take 1 pill twice a day for 5 days to clear it up.   You can use ice on this today to help with any pain.

## 2020-06-12 NOTE — Progress Notes (Signed)
   Subjective:   Patient ID: Anita [name], female    DOB: 05/20/1946, 74 y.o.   MRN: [id number]  HPI The patient is a 74 YO female coming in for cyst on her back. This has not been draining but she thinks it is causing her back pain. She denies fevers or chills. Cannot see if there is a rash. Previously last fall it was causing her problems. Started Sunday. Overall worsening pain since that time and she feels swollen.   Review of Systems  Constitutional: Negative.   HENT: Negative.   Eyes: Negative.   Respiratory: Negative for cough, chest tightness and shortness of breath.   Cardiovascular: Negative for chest pain, palpitations and leg swelling.  Gastrointestinal: Negative for abdominal distention, abdominal pain, constipation, diarrhea, nausea and vomiting.  Musculoskeletal: Negative.   Skin: Negative.        cyst  Neurological: Negative.   Psychiatric/Behavioral: Negative.     Objective:  Physical Exam Constitutional:      Appearance: She is well-developed.  HENT:     Head: Normocephalic and atraumatic.  Cardiovascular:     Rate and Rhythm: Normal rate and regular rhythm.  Pulmonary:     Effort: Pulmonary effort is normal. No respiratory distress.     Breath sounds: Normal breath sounds. No wheezing or rales.  Abdominal:     General: Bowel sounds are normal. There is no distension.     Palpations: Abdomen is soft.     Tenderness: There is no abdominal tenderness. There is no rebound.  Musculoskeletal:     Cervical back: Normal range of motion.     Comments: Cyst left mid back with black circular head with purulent drainage expressible. No rash on skin or signs of cellulitis  Skin:    General: Skin is warm and dry.  Neurological:     Mental Status: She is alert and oriented to person, place, and time.     Coordination: Coordination normal.     Vitals:   06/12/20 0842  BP: 138/88  Pulse: 92  Temp: 98.3 F (36.8 C)  TempSrc: Oral  SpO2: 98%  Weight: 135 lb  (61.2 kg)  Height: 5' 2.5" (1.588 m)    This visit occurred during the SARS-CoV-2 public health emergency.  Safety protocols were in place, including screening questions prior to the visit, additional usage of staff PPE, and extensive cleaning of exam room while observing appropriate contact time as indicated for disinfecting solutions.   Assessment & Plan:

## 2020-07-08 ENCOUNTER — Encounter: Payer: Self-pay | Admitting: Internal Medicine

## 2020-07-09 ENCOUNTER — Ambulatory Visit: Payer: Medicare HMO | Admitting: Internal Medicine

## 2020-07-18 NOTE — Telephone Encounter (Signed)
Called pt and LVM to call office back to schedule CPE appt after 11/27/2020. Thanks.

## 2020-07-23 ENCOUNTER — Ambulatory Visit: Payer: Medicare HMO | Admitting: Internal Medicine

## 2020-07-23 ENCOUNTER — Other Ambulatory Visit (INDEPENDENT_AMBULATORY_CARE_PROVIDER_SITE_OTHER): Payer: Medicare HMO

## 2020-07-23 ENCOUNTER — Encounter: Payer: Self-pay | Admitting: Internal Medicine

## 2020-07-23 VITALS — BP 130/66 | HR 70 | Ht 62.5 in | Wt 133.0 lb

## 2020-07-23 DIAGNOSIS — R933 Abnormal findings on diagnostic imaging of other parts of digestive tract: Secondary | ICD-10-CM | POA: Diagnosis not present

## 2020-07-23 DIAGNOSIS — R6889 Other general symptoms and signs: Secondary | ICD-10-CM | POA: Diagnosis not present

## 2020-07-23 DIAGNOSIS — D62 Acute posthemorrhagic anemia: Secondary | ICD-10-CM

## 2020-07-23 DIAGNOSIS — N183 Chronic kidney disease, stage 3 unspecified: Secondary | ICD-10-CM

## 2020-07-23 DIAGNOSIS — F40240 Claustrophobia: Secondary | ICD-10-CM | POA: Diagnosis not present

## 2020-07-23 DIAGNOSIS — K3182 Dieulafoy lesion (hemorrhagic) of stomach and duodenum: Secondary | ICD-10-CM

## 2020-07-23 DIAGNOSIS — Z86010 Personal history of colonic polyps: Secondary | ICD-10-CM

## 2020-07-23 LAB — BASIC METABOLIC PANEL
BUN: 51 mg/dL — ABNORMAL HIGH (ref 6–23)
CO2: 22 mEq/L (ref 19–32)
Calcium: 10.8 mg/dL — ABNORMAL HIGH (ref 8.4–10.5)
Chloride: 108 mEq/L (ref 96–112)
Creatinine, Ser: 1.64 mg/dL — ABNORMAL HIGH (ref 0.40–1.20)
GFR: 37.27 mL/min — ABNORMAL LOW (ref >60.00)
Glucose, Bld: 73 mg/dL (ref 70–99)
Potassium: 4.5 mEq/L (ref 3.5–5.1)
Sodium: 138 mEq/L (ref 135–145)

## 2020-07-23 LAB — CBC WITH DIFFERENTIAL/PLATELET
Basophils Absolute: 0 10*3/uL (ref 0.0–0.1)
Basophils Relative: 0.3 % (ref 0.0–3.0)
Eosinophils Absolute: 0.1 10*3/uL (ref 0.0–0.7)
Eosinophils Relative: 1.4 % (ref 0.0–5.0)
HCT: 35 % — ABNORMAL LOW (ref 36.0–46.0)
Hemoglobin: 11.8 g/dL — ABNORMAL LOW (ref 12.0–15.0)
Lymphocytes Relative: 46.7 % — ABNORMAL HIGH (ref 12.0–46.0)
Lymphs Abs: 3.5 10*3/uL (ref 0.7–4.0)
MCHC: 33.7 g/dL (ref 30.0–36.0)
MCV: 85.7 fl (ref 78.0–100.0)
Monocytes Absolute: 0.9 10*3/uL (ref 0.1–1.0)
Monocytes Relative: 12.3 % — ABNORMAL HIGH (ref 3.0–12.0)
Neutro Abs: 3 10*3/uL (ref 1.4–7.7)
Neutrophils Relative %: 39.3 % — ABNORMAL LOW (ref 43.0–77.0)
Platelets: 209 10*3/uL (ref 150.0–400.0)
RBC: 4.09 Mil/uL (ref 3.87–5.11)
RDW: 19.5 % — ABNORMAL HIGH (ref 11.5–15.5)
WBC: 7.6 10*3/uL (ref 4.0–10.5)

## 2020-07-23 LAB — FERRITIN: Ferritin: 24.8 ng/mL (ref 10.0–291.0)

## 2020-07-23 MED ORDER — SUTAB 1479-225-188 MG PO TABS: 1.0000 | ORAL_TABLET | Freq: Once | ORAL | 0 refills | Status: AC
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
## 2020-07-23 NOTE — Patient Instructions (Addendum)
Normal BMI (Body Mass Index- based on height and weight) is between 23 and 30. Your BMI today is Body mass index is 23.94 kg/m. Marland Kitchen Please consider follow up  regarding your BMI with your Primary Care Provider.   Your provider has requested that you go to the basement level for lab work before leaving today. Press "B" on the elevator. The lab is located at the first door on the left as you exit the elevator.   You have been scheduled for a colonoscopy. Please follow written instructions given to you at your visit today.  Please pick up your prep supplies at the pharmacy within the next 1-3 days. If you use inhalers (even only as needed), please bring them with you on the day of your procedure.    I appreciate the opportunity to care for you. Ronney Lion, [facility]
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
## 2020-07-25 ENCOUNTER — Encounter: Payer: Self-pay | Admitting: Internal Medicine

## 2020-07-28 ENCOUNTER — Encounter: Payer: Self-pay | Admitting: Internal Medicine

## 2020-07-28 DIAGNOSIS — N1832 Chronic kidney disease, stage 3b: Secondary | ICD-10-CM | POA: Insufficient documentation

## 2020-07-29 ENCOUNTER — Other Ambulatory Visit: Payer: Self-pay

## 2020-07-29 DIAGNOSIS — R933 Abnormal findings on diagnostic imaging of other parts of digestive tract: Secondary | ICD-10-CM

## 2020-08-06 ENCOUNTER — Encounter: Payer: Medicare HMO | Admitting: Internal Medicine

## 2020-08-06 ENCOUNTER — Telehealth: Payer: Self-pay | Admitting: Internal Medicine

## 2020-08-06 NOTE — Telephone Encounter (Signed)
Pls call pt. She has some questions about her procedure on Friday.

## 2020-08-07 NOTE — Telephone Encounter (Signed)
Questions about prep answered.  She will call back for additional questions or concerns

## 2020-08-08 ENCOUNTER — Encounter: Payer: Self-pay | Admitting: Internal Medicine

## 2020-08-08 ENCOUNTER — Other Ambulatory Visit: Payer: Self-pay

## 2020-08-08 ENCOUNTER — Ambulatory Visit (AMBULATORY_SURGERY_CENTER): Payer: Medicare HMO | Admitting: Internal Medicine

## 2020-08-08 VITALS — BP 132/55 | HR 71 | Temp 95.9°F | Resp 16 | Ht 62.0 in | Wt 133.0 lb

## 2020-08-08 DIAGNOSIS — D125 Benign neoplasm of sigmoid colon: Secondary | ICD-10-CM

## 2020-08-08 DIAGNOSIS — D127 Benign neoplasm of rectosigmoid junction: Secondary | ICD-10-CM | POA: Diagnosis not present

## 2020-08-08 DIAGNOSIS — K621 Rectal polyp: Secondary | ICD-10-CM | POA: Diagnosis not present

## 2020-08-08 DIAGNOSIS — Z86010 Personal history of colonic polyps: Secondary | ICD-10-CM | POA: Diagnosis not present

## 2020-08-08 DIAGNOSIS — K635 Polyp of colon: Secondary | ICD-10-CM | POA: Diagnosis not present

## 2020-08-08 DIAGNOSIS — D128 Benign neoplasm of rectum: Secondary | ICD-10-CM

## 2020-08-08 DIAGNOSIS — Z1211 Encounter for screening for malignant neoplasm of colon: Secondary | ICD-10-CM | POA: Diagnosis not present

## 2020-08-08 MED ORDER — SODIUM CHLORIDE 0.9 % IV SOLN: 500.0000 mL | Freq: Once | INTRAVENOUS | Status: DC

## 2020-08-08 NOTE — Progress Notes (Signed)
To PACU, VSS. Report to [email or web address].tb 

## 2020-08-08 NOTE — Progress Notes (Signed)
Called to room to assist during endoscopic procedure.  Patient ID and intended procedure confirmed with present staff. Received instructions for my participation in the procedure from the performing physician. °

## 2020-08-08 NOTE — Progress Notes (Signed)
VS taken by C.W. 

## 2020-08-08 NOTE — Op Note (Signed)
[facility] Patient Name: Anita [name] Procedure Date: 08/08/2020 2:02 PM MRN: [id number] Endoscopist: Gatha Mayer , MD Age: 74 Referring MD:  Date of Birth: June 15, 1946 Gender: Female Account #: [id number] Procedure:                Colonoscopy Indications:              Surveillance: Personal history of adenomatous                            polyps on last colonoscopy 5 years ago Medicines:                Propofol per Anesthesia, Monitored Anesthesia Care Procedure:                Pre-Anesthesia Assessment:                           - Prior to the procedure, a History and Physical                            was performed, and patient medications and                            allergies were reviewed. The patient's tolerance of                            previous anesthesia was also reviewed. The risks                            and benefits of the procedure and the sedation                            options and risks were discussed with the patient.                            All questions were answered, and informed consent                            was obtained. Prior Anticoagulants: The patient has                            taken no previous anticoagulant or antiplatelet                            agents. ASA Grade Assessment: II - A patient with                            mild systemic disease. After reviewing the risks                            and benefits, the patient was deemed in                            satisfactory condition to undergo the procedure.  After obtaining informed consent, the colonoscope                            was passed under direct vision. Throughout the                            procedure, the patient's blood pressure, pulse, and                            oxygen saturations were monitored continuously. The                            Colonoscope was introduced through the anus and                             advanced to the the cecum, identified by                            appendiceal orifice and ileocecal valve. The                            colonoscopy was performed without difficulty. The                            patient tolerated the procedure well. The quality                            of the bowel preparation was excellent. The bowel                            preparation used was Sutab via split dose                            instruction. The ileocecal valve, appendiceal                            orifice, and rectum were photographed. Scope In: 2:59:16 PM Scope Out: 3:16:28 PM Scope Withdrawal Time: 0 hours 14 minutes 28 seconds  Total Procedure Duration: 0 hours 17 minutes 12 seconds  Findings:                 The perianal and digital rectal examinations were                            normal.                           Five sessile polyps were found in the rectum and                            sigmoid colon. The polyps were diminutive in size.                            These polyps were removed with a cold snare.  Resection and retrieval were complete. Verification                            of patient identification for the specimen was                            done. Estimated blood loss was minimal.                           The exam was otherwise without abnormality on                            direct and retroflexion views. Complications:            No immediate complications. Estimated Blood Loss:     Estimated blood loss was minimal. Impression:               - Five diminutive polyps in the rectum and in the                            sigmoid colon, removed with a cold snare. Resected                            and retrieved.                           - The examination was otherwise normal on direct                            and retroflexion views.                           - Personal history of colonic polyps. Recommendation:           -  Patient has a contact number available for                            emergencies. The signs and symptoms of potential                            delayed complications were discussed with the                            patient. Return to normal activities tomorrow.                            Written discharge instructions were provided to the                            patient.                           - Resume previous diet.                           - Continue present medications.                           -  Repeat colonoscopy is recommended. The                            colonoscopy date will be determined after pathology                            results from today's exam become available for                            review. Gatha Mayer, MD 08/08/2020 3:27:12 PM This report has been signed electronically.

## 2020-08-08 NOTE — Patient Instructions (Addendum)
I found and removed 5 tiny polyps today.  I will let you know pathology results and when to have another routine colonoscopy by mail and/or My Chart.  I appreciate the opportunity to care for you. Gatha Mayer, MD, FACG  YOU HAD AN ENDOSCOPIC PROCEDURE TODAY AT [facility]:   Refer to the procedure report that was given to you for any specific questions about what was found during the examination.  If the procedure report does not answer your questions, please call your gastroenterologist to clarify.  If you requested that your care partner not be given the details of your procedure findings, then the procedure report has been included in a sealed envelope for you to review at your convenience later.  YOU SHOULD EXPECT: Some feelings of bloating in the abdomen. Passage of more gas than usual.  Walking can help get rid of the air that was put into your GI tract during the procedure and reduce the bloating. If you had a lower endoscopy (such as a colonoscopy or flexible sigmoidoscopy) you may notice spotting of blood in your stool or on the toilet paper. If you underwent a bowel prep for your procedure, you may not have a normal bowel movement for a few days.  Please Note:  You might notice some irritation and congestion in your nose or some drainage.  This is from the oxygen used during your procedure.  There is no need for concern and it should clear up in a day or so.  SYMPTOMS TO REPORT IMMEDIATELY:   Following lower endoscopy (colonoscopy or flexible sigmoidoscopy):  Excessive amounts of blood in the stool  Significant tenderness or worsening of abdominal pains  Swelling of the abdomen that is new, acute  Fever of 100F or higher  For urgent or emergent issues, a gastroenterologist can be reached at any hour by calling [phone]. Do not use MyChart messaging for urgent concerns.    DIET:  We do recommend a small meal at first, but then you may proceed to your  regular diet.  Drink plenty of fluids but you should avoid alcoholic beverages for 24 hours.  ACTIVITY:  You should plan to take it easy for the rest of today and you should NOT DRIVE or use heavy machinery until tomorrow (because of the sedation medicines used during the test).    FOLLOW UP: Our staff will call the number listed on your records 48-72 hours following your procedure to check on you and address any questions or concerns that you may have regarding the information given to you following your procedure. If we do not reach you, we will leave a message.  We will attempt to reach you two times.  During this call, we will ask if you have developed any symptoms of COVID 19. If you develop any symptoms (ie: fever, flu-like symptoms, shortness of breath, cough etc.) before then, please call [phone].  If you test positive for Covid 19 in the 2 weeks post procedure, please call and report this information to [place].    If any biopsies were taken you will be contacted by phone or by letter within the next 1-3 weeks.  Please call us at [phone] if you have not heard about the biopsies in 3 weeks.    SIGNATURES/CONFIDENTIALITY: You and/or your care partner have signed paperwork which will be entered into your electronic medical record.  These signatures attest to the fact that that the information above on your After  Visit Summary has been reviewed and is understood.  Full responsibility of the confidentiality of this discharge information lies with you and/or your care-partner. 

## 2020-08-12 ENCOUNTER — Telehealth: Payer: Self-pay

## 2020-08-12 ENCOUNTER — Ambulatory Visit
Admission: RE | Admit: 2020-08-12 | Discharge: 2020-08-12 | Disposition: A | Discharge status: Home / Self Care | Payer: Medicare HMO | Source: Ambulatory Visit | Attending: Internal Medicine | Admitting: Internal Medicine

## 2020-08-12 ENCOUNTER — Telehealth: Payer: Self-pay | Admitting: *Deleted

## 2020-08-12 DIAGNOSIS — R933 Abnormal findings on diagnostic imaging of other parts of digestive tract: Secondary | ICD-10-CM

## 2020-08-12 DIAGNOSIS — D3502 Benign neoplasm of left adrenal gland: Secondary | ICD-10-CM | POA: Diagnosis not present

## 2020-08-12 MED ORDER — IOPAMIDOL (ISOVUE-300) INJECTION 61%
80.0000 mL | Freq: Once | INTRAVENOUS | Status: AC | PRN
  Administered 2020-08-12: 80 mL via INTRAVENOUS

## 2020-08-12 NOTE — Telephone Encounter (Signed)
Left message on f/u call 

## 2020-08-12 NOTE — Telephone Encounter (Signed)
LVM

## 2020-08-15 ENCOUNTER — Encounter: Payer: Self-pay | Admitting: Internal Medicine

## 2020-08-20 ENCOUNTER — Encounter: Payer: Self-pay | Admitting: Internal Medicine

## 2020-10-13 ENCOUNTER — Telehealth: Payer: Self-pay | Admitting: Cardiovascular Disease

## 2020-10-13 NOTE — Telephone Encounter (Signed)
*  STAT* If patient is at the pharmacy, call can be transferred to refill team.   1. Which medications need to be refilled? (please list name of each medication and dose if known)   atorvastatin (LIPITOR) 40 MG tablet   2. Which pharmacy/location (including street and city if local pharmacy) is medication to be sent to? [place], [place]  3. Do they need a 30 day or 90 day supply? [place]

## 2020-10-14 DIAGNOSIS — E7800 Pure hypercholesterolemia, unspecified: Secondary | ICD-10-CM

## 2020-10-14 MED ORDER — ATORVASTATIN CALCIUM 40 MG PO TABS: 40.0000 mg | ORAL_TABLET | Freq: Every day | ORAL | 3 refills | Status: DC

## 2020-10-15 ENCOUNTER — Other Ambulatory Visit: Payer: Self-pay

## 2020-10-15 ENCOUNTER — Ambulatory Visit (INDEPENDENT_AMBULATORY_CARE_PROVIDER_SITE_OTHER): Payer: Medicare HMO | Admitting: Dermatology

## 2020-10-15 DIAGNOSIS — L729 Follicular cyst of the skin and subcutaneous tissue, unspecified: Secondary | ICD-10-CM | POA: Diagnosis not present

## 2020-10-15 NOTE — Progress Notes (Signed)
Note to PCP- cyst removal wont take care of back pain

## 2020-11-04 ENCOUNTER — Other Ambulatory Visit: Payer: Self-pay | Admitting: Internal Medicine

## 2020-11-04 DIAGNOSIS — Z1231 Encounter for screening mammogram for malignant neoplasm of breast: Secondary | ICD-10-CM

## 2020-11-26 ENCOUNTER — Telehealth: Payer: Self-pay | Admitting: Internal Medicine

## 2020-11-26 NOTE — Telephone Encounter (Signed)
LVM for pt to rtn my call to schedule AWV with [facility]. Please schedule this appt if pt calls the office.  °

## 2020-12-10 ENCOUNTER — Telehealth: Payer: Self-pay | Admitting: Internal Medicine

## 2020-12-10 NOTE — Telephone Encounter (Signed)
Copied from [place] [id number]. Topic: Medicare AWV >> Dec 10, 2020 10:09 AM Cher Nakai R wrote: Reason for CRM:  Left message for patient to call back and schedule Medicare Annual Wellness Visit (AWV) with Nurse Health Advisor.  This can be scheduled IN OFFICE or VIRTUAL/TELEPHONE VISIT.    This should be a 45 minute visit    Last AWV 11/28/2019

## 2020-12-18 ENCOUNTER — Other Ambulatory Visit: Payer: Medicare HMO

## 2020-12-18 DIAGNOSIS — Z20822 Contact with and (suspected) exposure to covid-19: Secondary | ICD-10-CM | POA: Diagnosis not present

## 2020-12-21 LAB — NOVEL CORONAVIRUS, NAA: SARS-CoV-2, NAA: NOT DETECTED

## 2020-12-23 ENCOUNTER — Other Ambulatory Visit (HOSPITAL_COMMUNITY): Payer: Medicare HMO

## 2020-12-24 IMAGING — MR MRI THORACIC SPINE WITHOUT CONTRAST
4 of 6 series · 14 of 48 positions shown · non-contrast
Comparison: None.

CLINICAL DATA: Bilateral shoulder pain radiating to the mid lower
back.

EXAM:
MRI THORACIC SPINE WITHOUT CONTRAST
TECHNIQUE: Multiplanar, multisequence MR imaging of the thoracic spine was
performed. No intravenous contrast was administered.

[Series 4: T2 · sagittal · 4.0mm · 0.43mm/px · 5 of 14 slices shown (1 of 3)]
[im 1/14]
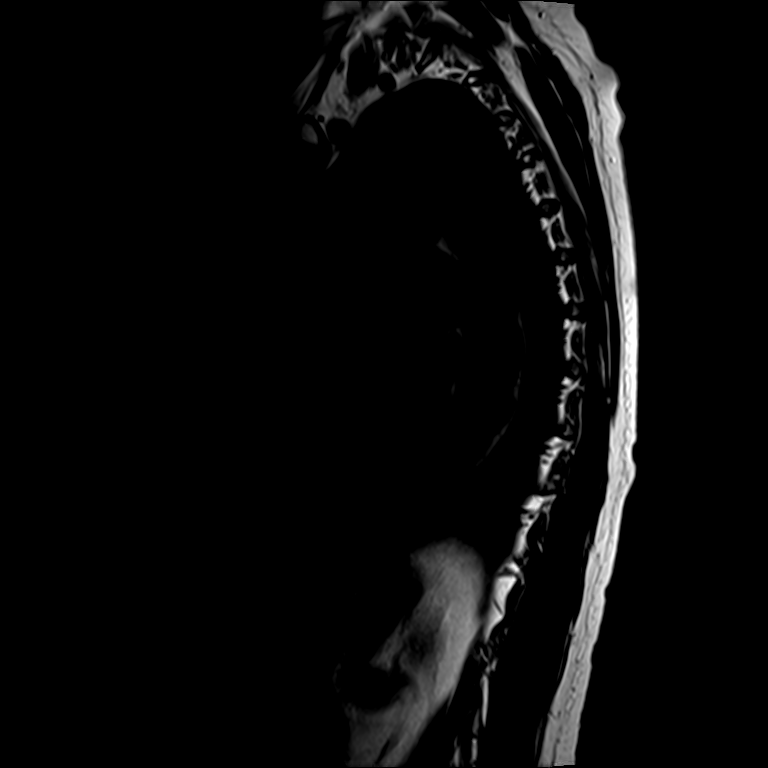
[im 3/14]
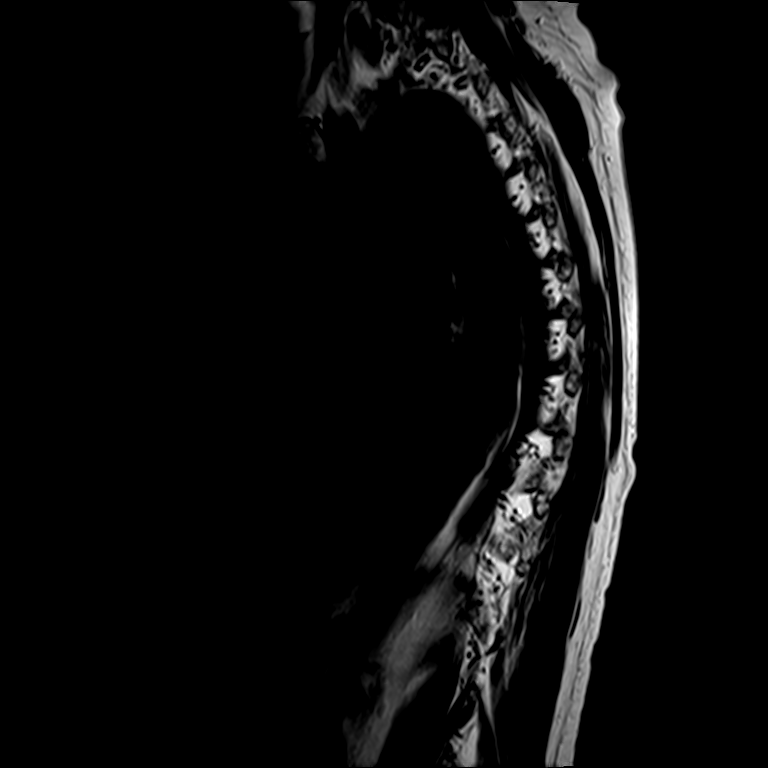
[im 6/14]
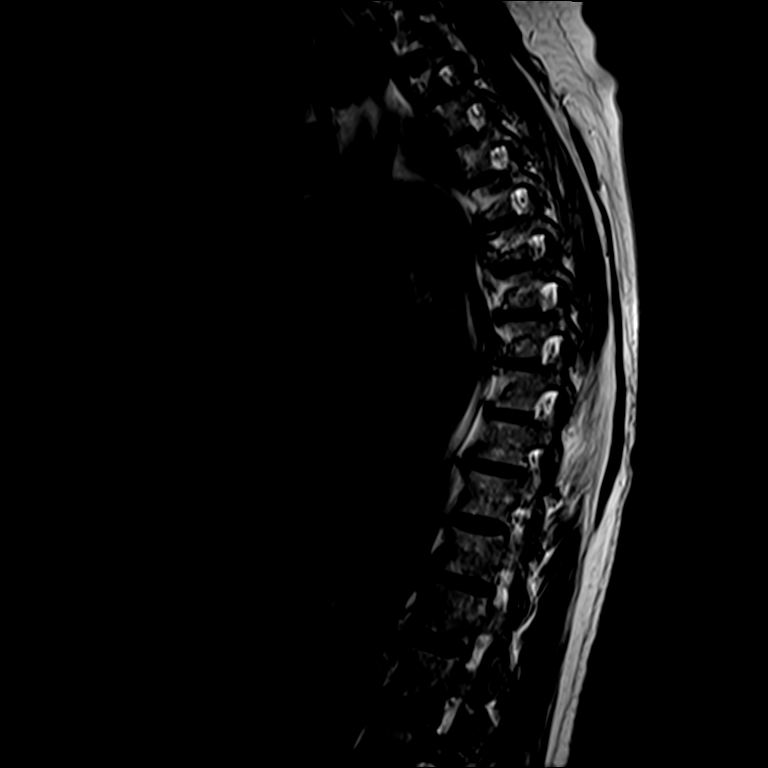
[im 8/14]
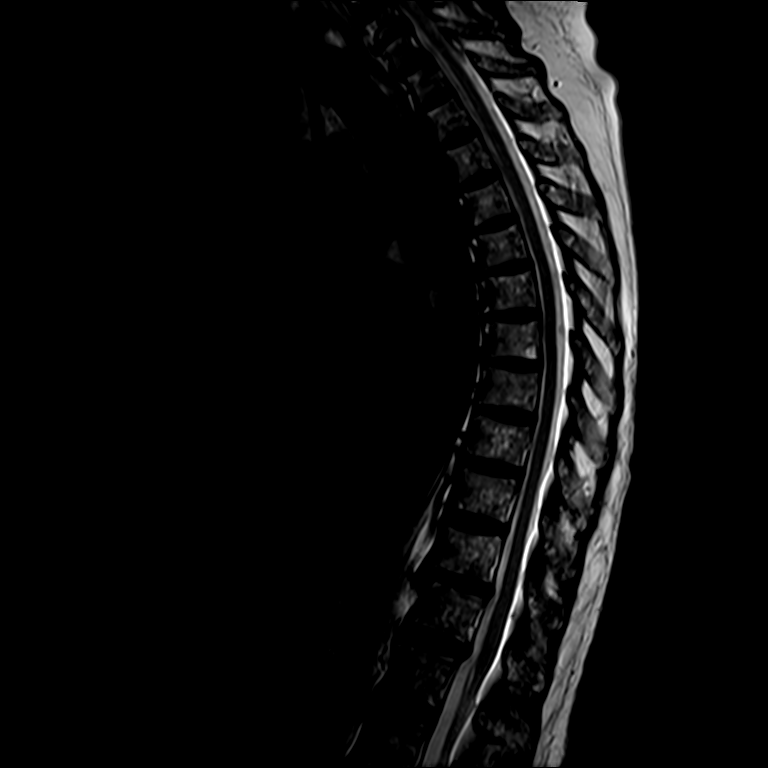
[im 14/14]
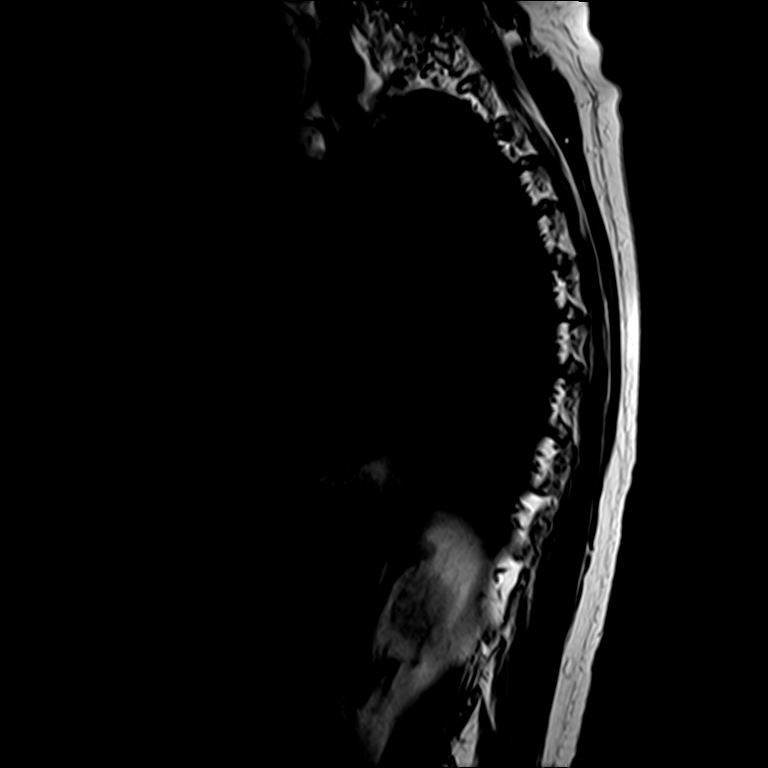

[Series 5: T1 · sagittal · 4.0mm · 0.86mm/px · 3 of 14 slices shown]
[im 3/14]
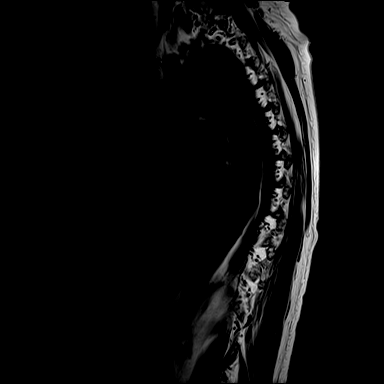
[im 8/14]
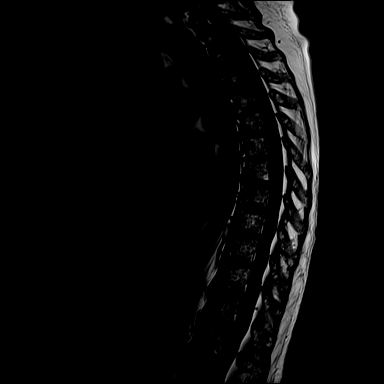
[im 14/14]
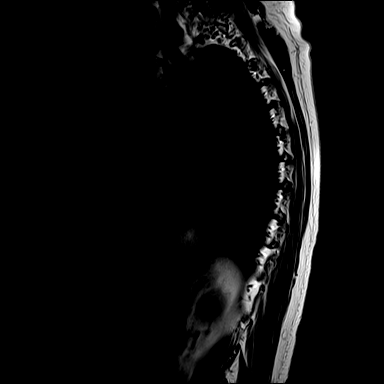

[Series 7: T2 · axial · 4.0mm · 0.39mm/px · z∈[-171,-84]mm · 3 of 30 slices shown (2 of 3)]
[im 5/30]
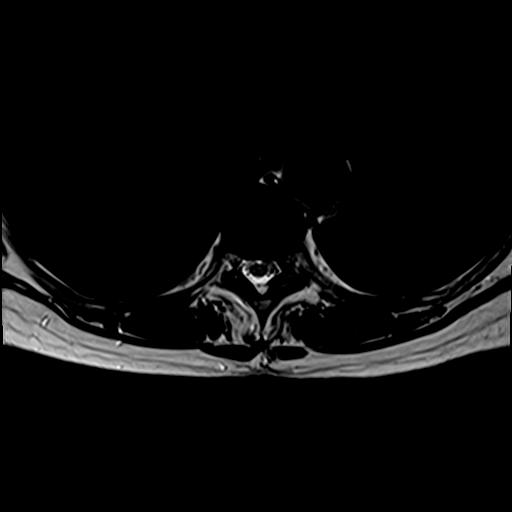
[im 15/30]
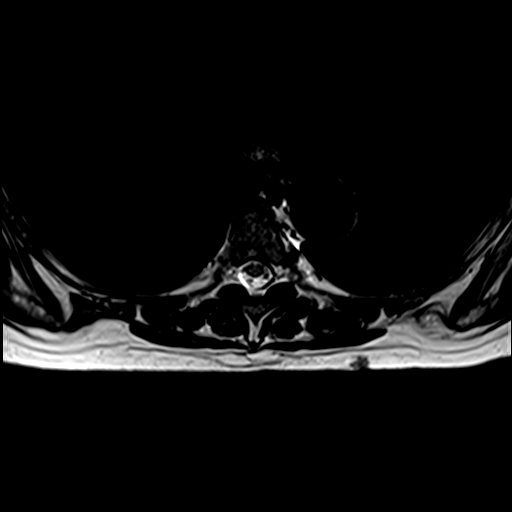
[im 25/30]
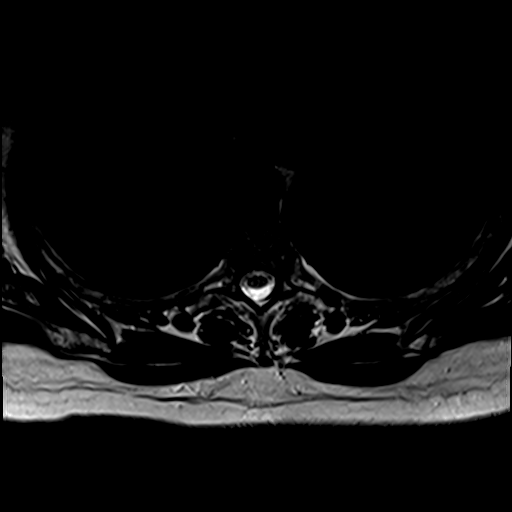

[Series 8: T2 · axial · 4.0mm · 0.39mm/px · z∈[-172,-81]mm · 3 of 30 slices shown (3 of 3)]
[im 5/30]
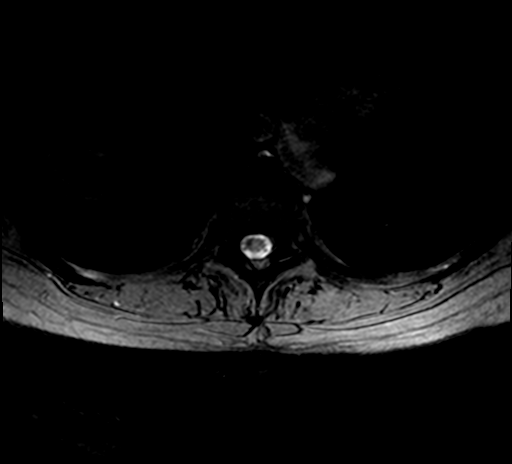
[im 15/30]
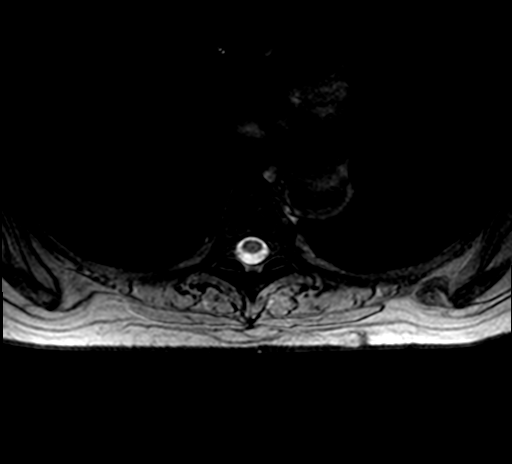
[im 25/30]
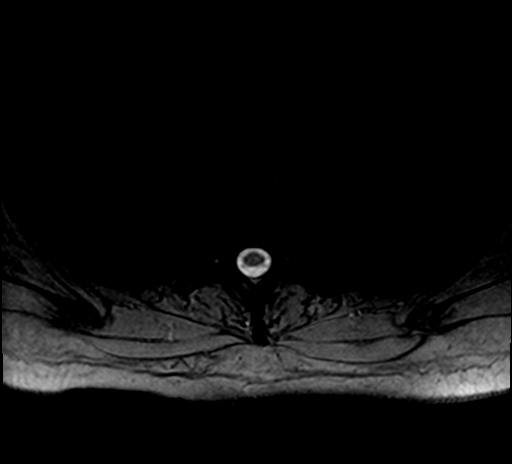

[14 of 48 positions shown; findings below may reference images not displayed]

FINDINGS: Alignment: Exaggerated thoracic kyphosis. Slight retrolisthesis at
T12-L1. C7-T1 mild anterolisthesis likely from facet degeneration.

Vertebrae: Heterogeneous marrow without focal lesion, discitis, or
fracture.

Cord: The cord at T7 is subtly but convincingly anteriorly
positioned relative to the remainder of the cord with slight
deformity of the dorsal cord. This is seen on both sagittal and
axial images. No alteration of flow voids to suggest a cyst or T2
hyperintense mass.

Paraspinal and other soft tissues: No evidence of mass or
inflammation.

Disc levels:

Mild spondylosis. Mild disc bulging at T11-12. Negative facets. No
impingement.
IMPRESSION: 1. Mild dorsal cord deformity and displacement at T7, suspect
underlying arachnoid web.
2. Mild degenerative changes without impingement.

## 2020-12-25 ENCOUNTER — Ambulatory Visit: Payer: Medicare HMO

## 2020-12-25 ENCOUNTER — Ambulatory Visit (HOSPITAL_COMMUNITY)
Admission: RE | Admit: 2020-12-25 | Payer: Medicare HMO | Source: Ambulatory Visit | Attending: Cardiovascular Disease | Admitting: Cardiovascular Disease

## 2020-12-29 ENCOUNTER — Other Ambulatory Visit: Payer: Self-pay

## 2020-12-29 ENCOUNTER — Ambulatory Visit (HOSPITAL_COMMUNITY)
Admission: RE | Admit: 2020-12-29 | Discharge: 2020-12-29 | Disposition: A | Discharge status: Home / Self Care | Payer: Medicare HMO | Source: Ambulatory Visit | Attending: Cardiology | Admitting: Cardiology

## 2020-12-29 ENCOUNTER — Other Ambulatory Visit: Payer: Self-pay | Admitting: Cardiovascular Disease

## 2020-12-29 DIAGNOSIS — I739 Peripheral vascular disease, unspecified: Secondary | ICD-10-CM | POA: Diagnosis not present

## 2020-12-29 DIAGNOSIS — I6522 Occlusion and stenosis of left carotid artery: Secondary | ICD-10-CM | POA: Insufficient documentation

## 2021-01-12 ENCOUNTER — Other Ambulatory Visit (HOSPITAL_COMMUNITY): Payer: Medicare HMO

## 2021-01-20 ENCOUNTER — Ambulatory Visit: Payer: Medicare HMO

## 2021-01-30 ENCOUNTER — Other Ambulatory Visit: Payer: Self-pay

## 2021-01-30 ENCOUNTER — Ambulatory Visit (HOSPITAL_COMMUNITY): Payer: Medicare HMO | Attending: Cardiology

## 2021-01-30 DIAGNOSIS — I359 Nonrheumatic aortic valve disorder, unspecified: Secondary | ICD-10-CM | POA: Insufficient documentation

## 2021-01-31 LAB — ECHOCARDIOGRAM COMPLETE
Area-P 1/2: 2.66 cm2
S' Lateral: 2.5 cm

## 2021-02-17 ENCOUNTER — Ambulatory Visit: Payer: Medicare HMO

## 2021-04-08 ENCOUNTER — Other Ambulatory Visit: Payer: Self-pay

## 2021-04-08 ENCOUNTER — Encounter: Payer: Self-pay | Admitting: Internal Medicine

## 2021-04-08 MED ORDER — AMLODIPINE BESYLATE 10 MG PO TABS: 10.0000 mg | ORAL_TABLET | Freq: Every day | ORAL | 0 refills | Status: DC

## 2021-05-13 ENCOUNTER — Encounter: Payer: Self-pay | Admitting: Internal Medicine

## 2021-05-13 ENCOUNTER — Other Ambulatory Visit: Payer: Self-pay

## 2021-05-13 ENCOUNTER — Ambulatory Visit (INDEPENDENT_AMBULATORY_CARE_PROVIDER_SITE_OTHER): Payer: Medicare HMO | Admitting: Internal Medicine

## 2021-05-13 VITALS — BP 130/82 | HR 75 | Temp 98.7°F | Resp 18 | Ht 62.5 in | Wt 136.4 lb

## 2021-05-13 DIAGNOSIS — E7800 Pure hypercholesterolemia, unspecified: Secondary | ICD-10-CM

## 2021-05-13 DIAGNOSIS — I10 Essential (primary) hypertension: Secondary | ICD-10-CM

## 2021-05-13 DIAGNOSIS — R7301 Impaired fasting glucose: Secondary | ICD-10-CM | POA: Diagnosis not present

## 2021-05-13 DIAGNOSIS — Z0000 Encounter for general adult medical examination without abnormal findings: Secondary | ICD-10-CM | POA: Diagnosis not present

## 2021-05-13 DIAGNOSIS — N1832 Chronic kidney disease, stage 3b: Secondary | ICD-10-CM

## 2021-05-13 DIAGNOSIS — G8929 Other chronic pain: Secondary | ICD-10-CM

## 2021-05-13 DIAGNOSIS — I700 Atherosclerosis of aorta: Secondary | ICD-10-CM | POA: Diagnosis not present

## 2021-05-13 DIAGNOSIS — R6889 Other general symptoms and signs: Secondary | ICD-10-CM | POA: Diagnosis not present

## 2021-05-13 DIAGNOSIS — J410 Simple chronic bronchitis: Secondary | ICD-10-CM | POA: Diagnosis not present

## 2021-05-13 DIAGNOSIS — M546 Pain in thoracic spine: Secondary | ICD-10-CM

## 2021-05-13 LAB — CBC
HCT: 33.7 % — ABNORMAL LOW (ref 36.0–46.0)
Hemoglobin: 11.5 g/dL — ABNORMAL LOW (ref 12.0–15.0)
MCHC: 34.3 g/dL (ref 30.0–36.0)
MCV: 88.2 fl (ref 78.0–100.0)
Platelets: 192 10*3/uL (ref 150.0–400.0)
RBC: 3.82 Mil/uL — ABNORMAL LOW (ref 3.87–5.11)
RDW: 18.1 % — ABNORMAL HIGH (ref 11.5–15.5)
WBC: 5.6 10*3/uL (ref 4.0–10.5)

## 2021-05-13 LAB — LIPID PANEL
Cholesterol: 150 mg/dL (ref 0–200)
HDL: 65.2 mg/dL (ref >39.00)
LDL Cholesterol: 60 mg/dL (ref 0–99)
NonHDL: 84.71
Total CHOL/HDL Ratio: 2
Triglycerides: 122 mg/dL (ref 0.0–149.0)
VLDL: 24.4 mg/dL (ref 0.0–40.0)

## 2021-05-13 LAB — COMPREHENSIVE METABOLIC PANEL
ALT: 29 U/L (ref 0–35)
AST: 25 U/L (ref 0–37)
Albumin: 4.4 g/dL (ref 3.5–5.2)
Alkaline Phosphatase: 69 U/L (ref 39–117)
BUN: 36 mg/dL — ABNORMAL HIGH (ref 6–23)
CO2: 21 mEq/L (ref 19–32)
Calcium: 10.4 mg/dL (ref 8.4–10.5)
Chloride: 108 mEq/L (ref 96–112)
Creatinine, Ser: 1.63 mg/dL — ABNORMAL HIGH (ref 0.40–1.20)
GFR: 31.27 mL/min — ABNORMAL LOW (ref >60.00)
Glucose, Bld: 84 mg/dL (ref 70–99)
Potassium: 5.3 mEq/L — ABNORMAL HIGH (ref 3.5–5.1)
Sodium: 136 mEq/L (ref 135–145)
Total Bilirubin: 0.4 mg/dL (ref 0.2–1.2)
Total Protein: 8.4 g/dL — ABNORMAL HIGH (ref 6.0–8.3)

## 2021-05-13 LAB — HEMOGLOBIN A1C: Hgb A1c MFr Bld: 5.1 % (ref 4.6–6.5)

## 2021-05-13 MED ORDER — AMLODIPINE BESYLATE 10 MG PO TABS: 10.0000 mg | ORAL_TABLET | Freq: Every day | ORAL | 3 refills | Status: DC

## 2021-05-13 MED ORDER — ATORVASTATIN CALCIUM 40 MG PO TABS: 40.0000 mg | ORAL_TABLET | Freq: Every day | ORAL | 3 refills | Status: DC

## 2021-05-13 NOTE — Assessment & Plan Note (Signed)
Taking lipitor 40 mg daily and checking lipid panel. Adjust as needed. Advised to stop smoking.

## 2021-05-13 NOTE — Patient Instructions (Addendum)
We are checking the labs today. Make sure to get the covid-19 booster shot.  Health Maintenance, Female Adopting a healthy lifestyle and getting preventive care are important in promoting health and wellness. Ask your health care provider about:  The right schedule for you to have regular tests and exams.  Things you can do on your own to prevent diseases and keep yourself healthy. What should I know about diet, weight, and exercise? Eat a healthy diet  Eat a diet that includes plenty of vegetables, fruits, low-fat dairy products, and lean protein.  Do not eat a lot of foods that are high in solid fats, added sugars, or sodium.   Maintain a healthy weight Body mass index (BMI) is used to identify weight problems. It estimates body fat based on height and weight. Your health care provider can help determine your BMI and help you achieve or maintain a healthy weight. Get regular exercise Get regular exercise. This is one of the most important things you can do for your health. Most adults should:  Exercise for at least 150 minutes each week. The exercise should increase your heart rate and make you sweat (moderate-intensity exercise).  Do strengthening exercises at least twice a week. This is in addition to the moderate-intensity exercise.  Spend less time sitting. Even light physical activity can be beneficial. Watch cholesterol and blood lipids Have your blood tested for lipids and cholesterol at 73 years of age, then have this test every 5 years. Have your cholesterol levels checked more often if:  Your lipid or cholesterol levels are high.  You are older than 73 years of age.  You are at high risk for heart disease. What should I know about cancer screening? Depending on your health history and family history, you may need to have cancer screening at various ages. This may include screening for:  Breast cancer.  Cervical cancer.  Colorectal cancer.  Skin cancer.  Lung  cancer. What should I know about heart disease, diabetes, and high blood pressure? Blood pressure and heart disease  High blood pressure causes heart disease and increases the risk of stroke. This is more likely to develop in people who have high blood pressure readings, are of African descent, or are overweight.  Have your blood pressure checked: ? Every 3-5 years if you are 73-15 years of age. ? Every year if you are 73 years old or older. Diabetes Have regular diabetes screenings. This checks your fasting blood sugar level. Have the screening done:  Once every three years after age 73 if you are at a normal weight and have a low risk for diabetes.  More often and at a younger age if you are overweight or have a high risk for diabetes. What should I know about preventing infection? Hepatitis B If you have a higher risk for hepatitis B, you should be screened for this virus. Talk with your health care provider to find out if you are at risk for hepatitis B infection. Hepatitis C Testing is recommended for:  Everyone born from [birth year] through 1963.  Anyone with known risk factors for hepatitis C. Sexually transmitted infections (STIs)  Get screened for STIs, including gonorrhea and chlamydia, if: ? You are sexually active and are younger than 73 years of age. ? You are older than 73 years of age and your health care provider tells you that you are at risk for this type of infection. ? Your sexual activity has changed since you were last screened,  and you are at increased risk for chlamydia or gonorrhea. Ask your health care provider if you are at risk.  Ask your health care provider about whether you are at high risk for HIV. Your health care provider may recommend a prescription medicine to help prevent HIV infection. If you choose to take medicine to prevent HIV, you should first get tested for HIV. You should then be tested every 3 months for as long as you are taking the  medicine. Pregnancy  If you are about to stop having your period (premenopausal) and you may become pregnant, seek counseling before you get pregnant.  Take 400 to 800 micrograms (mcg) of folic acid every day if you become pregnant.  Ask for birth control (contraception) if you want to prevent pregnancy. Osteoporosis and menopause Osteoporosis is a disease in which the bones lose minerals and strength with aging. This can result in bone fractures. If you are 73 years old or older, or if you are at risk for osteoporosis and fractures, ask your health care provider if you should:  Be screened for bone loss.  Take a calcium or vitamin D supplement to lower your risk of fractures.  Be given hormone replacement therapy (HRT) to treat symptoms of menopause. Follow these instructions at home: Lifestyle  Do not use any products that contain nicotine or tobacco, such as cigarettes, e-cigarettes, and chewing tobacco. If you need help quitting, ask your health care provider.  Do not use street drugs.  Do not share needles.  Ask your health care provider for help if you need support or information about quitting drugs. Alcohol use  Do not drink alcohol if: ? Your health care provider tells you not to drink. ? You are pregnant, may be pregnant, or are planning to become pregnant.  If you drink alcohol: ? Limit how much you use to 0-1 drink a day. ? Limit intake if you are breastfeeding.  Be aware of how much alcohol is in your drink. In the [place] one drink equals one 12 oz bottle of beer (355 mL), one 5 oz glass of wine (148 mL), or one 1 oz glass of hard liquor (44 mL). General instructions  Schedule regular health, dental, and eye exams.  Stay current with your vaccines.  Tell your health care provider if: ? You often feel depressed. ? You have ever been abused or do not feel safe at home. Summary  Adopting a healthy lifestyle and getting preventive care are important in  promoting health and wellness.  Follow your health care provider's instructions about healthy diet, exercising, and getting tested or screened for diseases.  Follow your health care provider's instructions on monitoring your cholesterol and blood pressure. This information is not intended to replace advice given to you by your health care provider. Make sure you discuss any questions you have with your health care provider. Document Revised: 11/29/2018 Document Reviewed: 11/29/2018 Elsevier Patient Education  2021 [employer].
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
## 2021-05-15 ENCOUNTER — Other Ambulatory Visit: Payer: Self-pay | Admitting: Internal Medicine

## 2021-05-15 DIAGNOSIS — E875 Hyperkalemia: Secondary | ICD-10-CM

## 2021-05-15 NOTE — Assessment & Plan Note (Signed)
Complicated by CKD stage 3b, BP at goal. Taking amlodipine 10 mg daily. Checking CMP and adjust as needed.

## 2021-05-15 NOTE — Assessment & Plan Note (Signed)
Advised to quit smoking and talked with her about CV risk and other negative health impacts from smoking. She is not sure about making an attempt right now.

## 2021-05-15 NOTE — Assessment & Plan Note (Signed)
Referral to nephrology. Checking CMP and HgA1c. BP at goal. Counseled about this today.

## 2021-05-15 NOTE — Assessment & Plan Note (Signed)
Flu shot yearly. Covid-19 counseled due for booster. Pneumonia complete. Shingrix counseled to get at pharmacy. Tetanus due 2027. Colonoscopy due 2026. Mammogram due ordered, pap smear up to date with gyn and dexa due 2025. Counseled about sun safety and mole surveillance. Counseled about the dangers of distracted driving. Given 10 year screening recommendations.

## 2021-05-15 NOTE — Assessment & Plan Note (Signed)
She asks for referral to neurology for this. This is placed although I have let her know that often neurology will not be most helpful for this and she understands.

## 2021-05-15 NOTE — Assessment & Plan Note (Signed)
Checking lipid panel and adjust lipitor 40 mg daily as needed. 

## 2021-07-20 ENCOUNTER — Encounter: Payer: Self-pay | Admitting: Dermatology

## 2021-07-20 ENCOUNTER — Encounter: Payer: Self-pay | Admitting: Internal Medicine

## 2021-07-20 NOTE — Progress Notes (Signed)
   Follow-Up Visit   Subjective  Anita [name] is a 75 y.o. female who presents for the following: Cyst (CYST ON BACK X YEARS, DR EXPRESSED IT X 2 TIMES).  Cyst on back, she wonders if this is the cause of her back pain. Location:  Duration:  Quality:  Associated Signs/Symptoms: Modifying Factors:  Severity:  Timing: Context:   Objective  Well appearing patient in no apparent distress; mood and affect are within normal limits. Mid Back Noninflamed but somewhat fibrotic 2 cm deep dermal nodule compatible with epidermoid cyst.  I suspect her back pain is not primarily due to to this lesion and that it will not be cured by removal of the cyst.      All skin waist up examined.   Assessment & Plan    Cyst of skin Mid Back  Patient may choose to schedule I hour surgery; encouraged to check with her insurance whether this will be a covered procedure.      I, Lavonna Monarch, MD, have reviewed all documentation for this visit.  The documentation on 07/20/21 for the exam, diagnosis, procedures, and orders are all accurate and complete.

## 2021-07-29 DIAGNOSIS — I739 Peripheral vascular disease, unspecified: Secondary | ICD-10-CM | POA: Diagnosis not present

## 2021-07-29 DIAGNOSIS — F17200 Nicotine dependence, unspecified, uncomplicated: Secondary | ICD-10-CM | POA: Diagnosis not present

## 2021-07-29 DIAGNOSIS — I129 Hypertensive chronic kidney disease with stage 1 through stage 4 chronic kidney disease, or unspecified chronic kidney disease: Secondary | ICD-10-CM | POA: Diagnosis not present

## 2021-07-29 DIAGNOSIS — K219 Gastro-esophageal reflux disease without esophagitis: Secondary | ICD-10-CM | POA: Diagnosis not present

## 2021-07-29 DIAGNOSIS — D509 Iron deficiency anemia, unspecified: Secondary | ICD-10-CM | POA: Diagnosis not present

## 2021-07-29 DIAGNOSIS — R809 Proteinuria, unspecified: Secondary | ICD-10-CM | POA: Diagnosis not present

## 2021-07-29 DIAGNOSIS — R011 Cardiac murmur, unspecified: Secondary | ICD-10-CM | POA: Diagnosis not present

## 2021-07-29 DIAGNOSIS — N1832 Chronic kidney disease, stage 3b: Secondary | ICD-10-CM | POA: Diagnosis not present

## 2021-08-05 IMAGING — CT CT ABD-PELV W/O CM
2 of 4 series · 15 of 46 positions shown, 17 images · non-contrast
Comparison: None

CLINICAL DATA: Nausea vomiting. Vomiting blood.

EXAM:
CT ABDOMEN AND PELVIS WITHOUT CONTRAST
TECHNIQUE: Multidetector CT imaging of the abdomen and pelvis was performed
following the standard protocol without IV contrast.

[Series 2: axial st · axial · 0.67mm/px · z∈[-426,-52]mm · 12 of 85 slices shown, 14 images]
[im 5/85  soft-tissue]
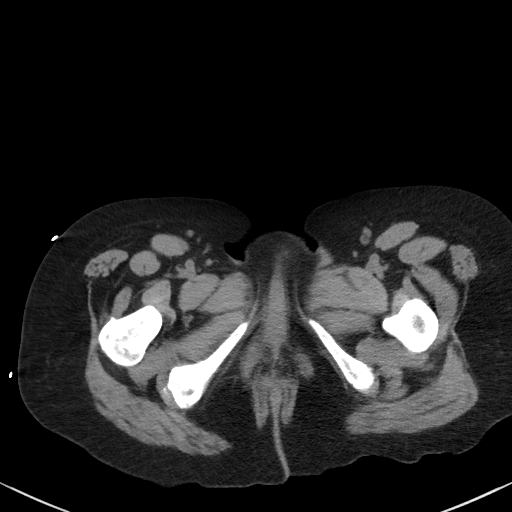
[im 5/85  bone]
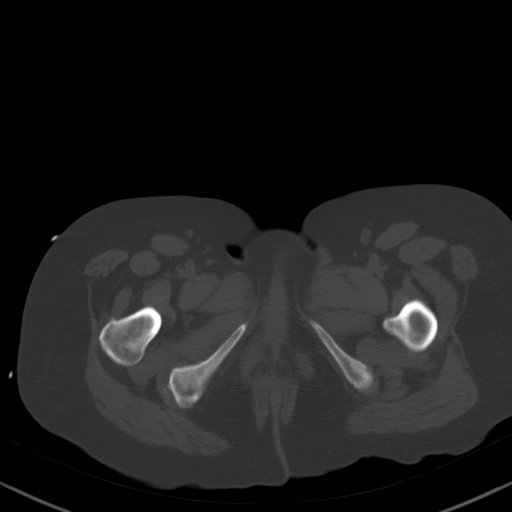
[im 15/85  soft-tissue]
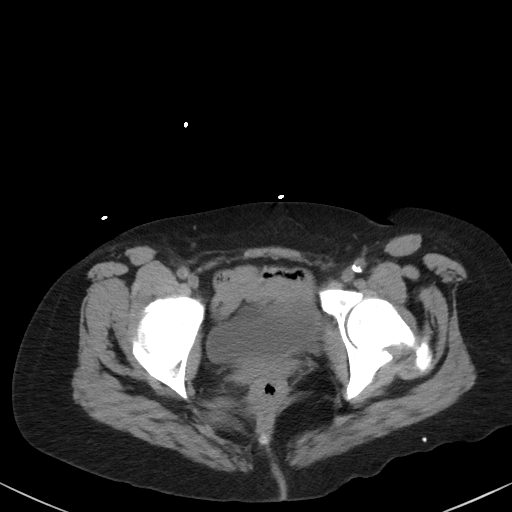
[im 19/85  soft-tissue]
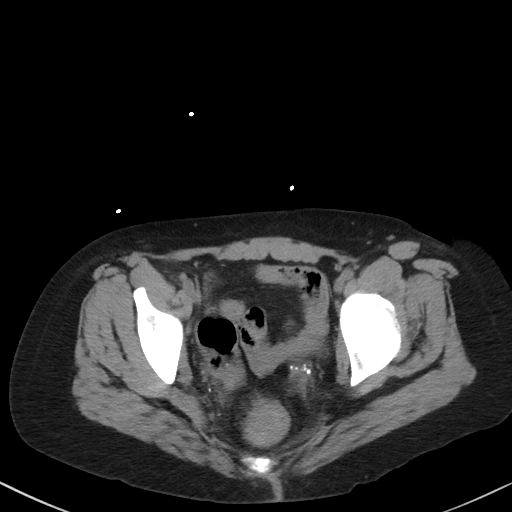
[im 24/85  soft-tissue]
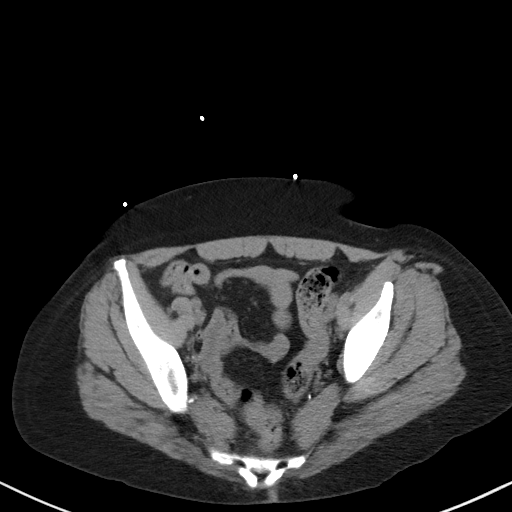
[im 33/85  soft-tissue]
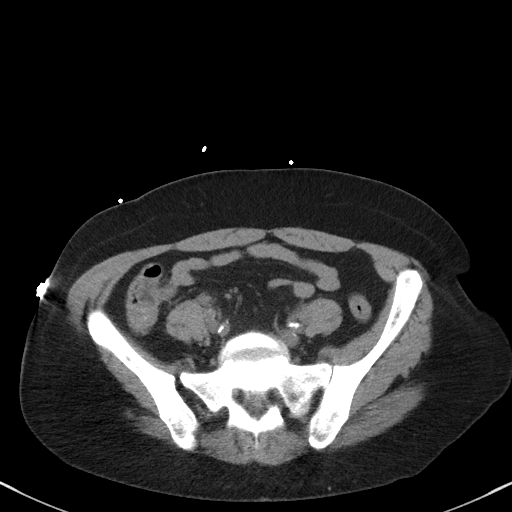
[im 38/85  soft-tissue]
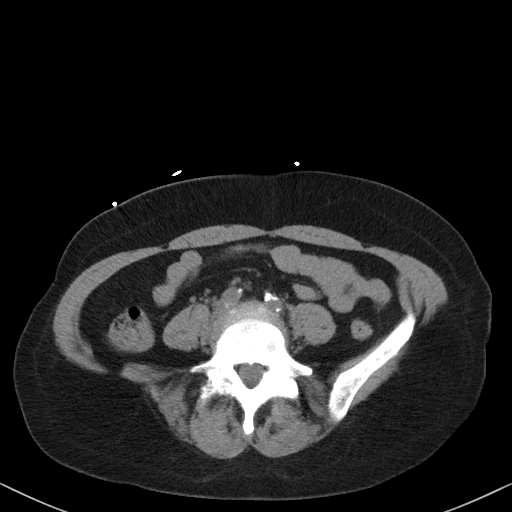
[im 47/85  soft-tissue]
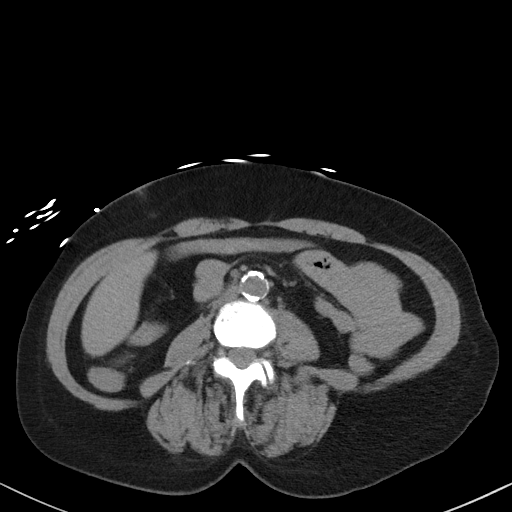
[im 52/85  soft-tissue]
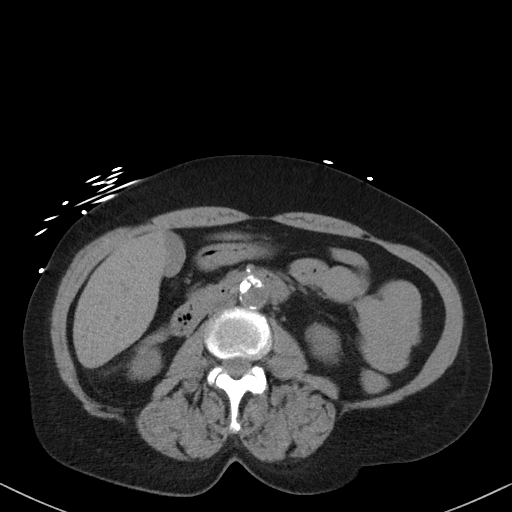
[im 61/85  soft-tissue]
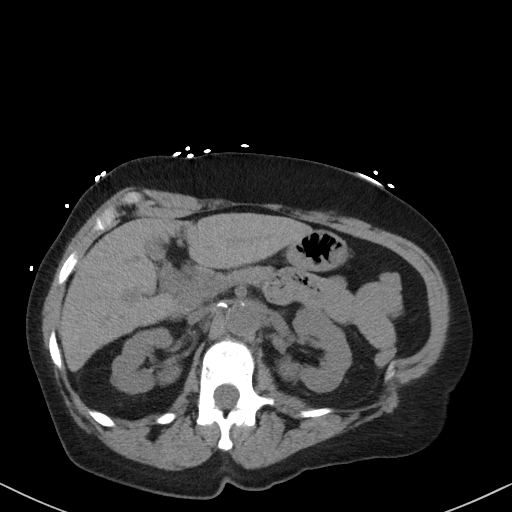
[im 61/85  bone]
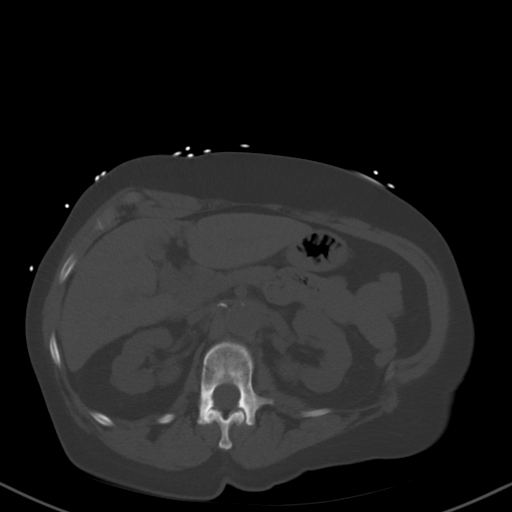
[im 66/85  soft-tissue]
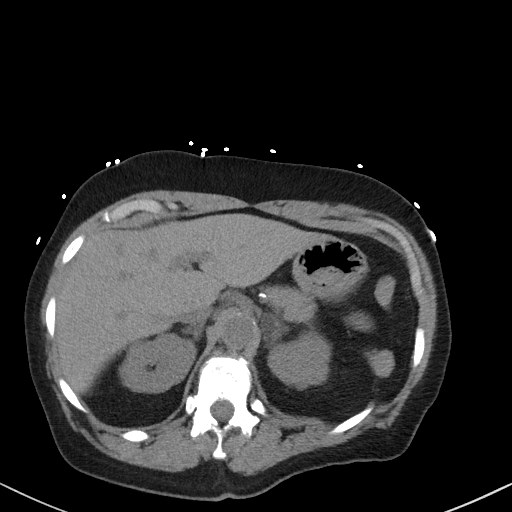
[im 71/85  soft-tissue]
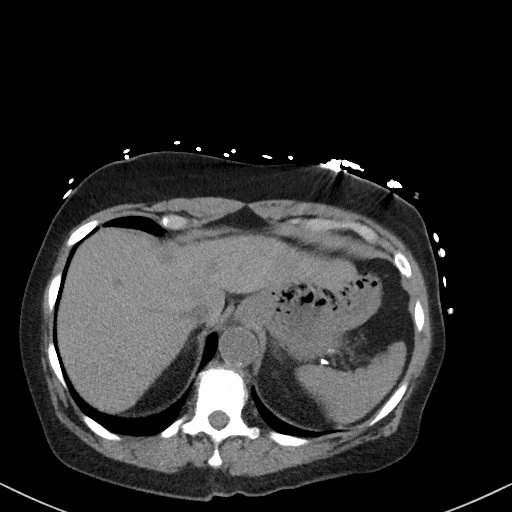
[im 80/85  soft-tissue]
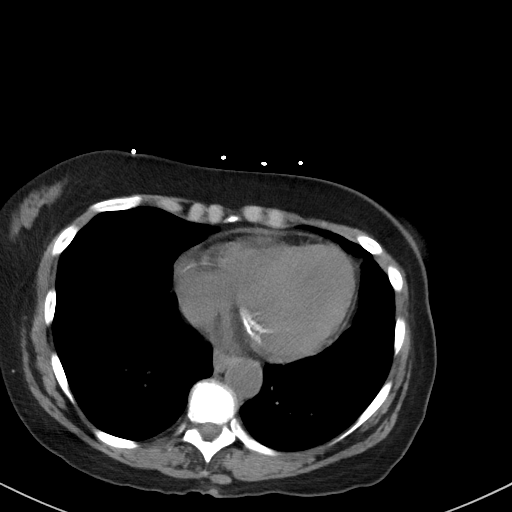

[Series 4: coronal st · coronal · 0.68mm/px · 3 of 78 slices shown]
[im 26/78  soft-tissue]
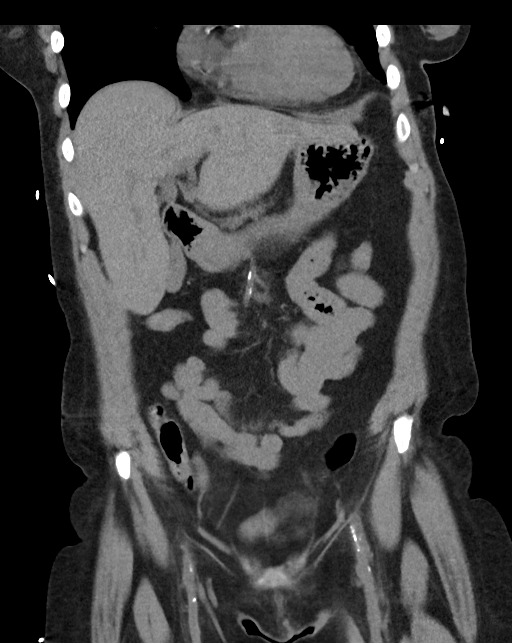
[im 35/78  soft-tissue]
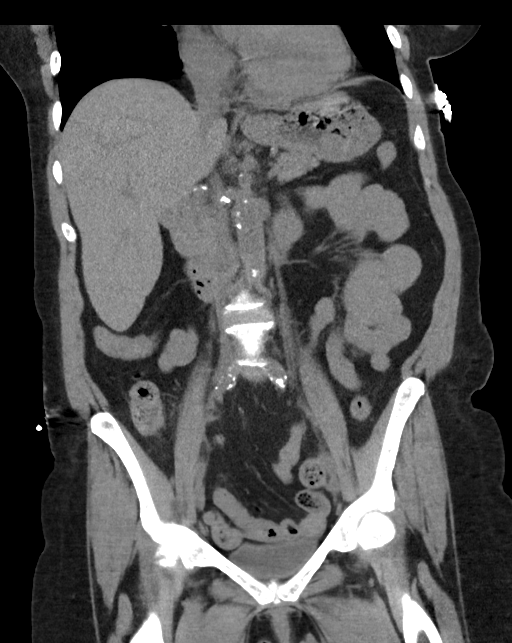
[im 43/78  soft-tissue]
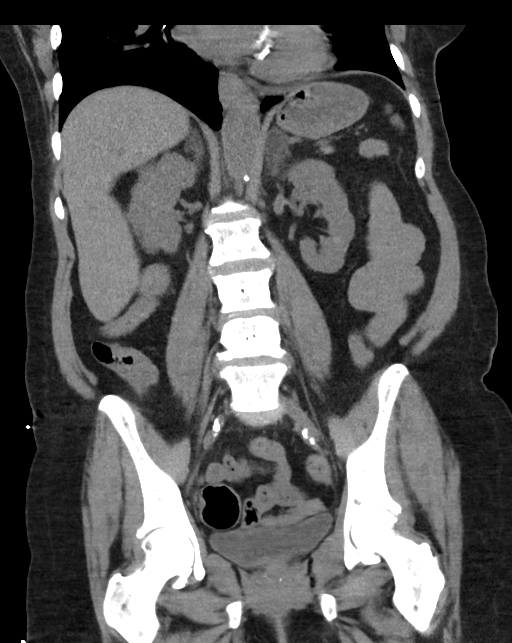

[15 of 46 positions shown; findings below may reference images not displayed]

FINDINGS: Lower chest: No consolidation or pleural effusion. Signs of mitral
annular calcification and right coronary artery calcification. Heart
is incompletely imaged.

Hepatobiliary: Liver without focal lesion. Sludge in the gallbladder
lumen.

Pancreas: 7 mm area of low attenuation in the pancreatic head best
seen on coronal image 38 of series 4, not clearly cystic. No ductal
dilation or peripancreatic inflammation.

Spleen: Spleen is normal size without focal lesion.

Adrenals/Urinary Tract: Left adrenal lesion 2.4 x 1.9 cm. Thickening
of the right adrenal. The areas show very low density as low as -19
Hounsfield units.

Hemorrhagic cysts in the left kidney. Renal cortical scarring
bilaterally. Low-attenuation lesion in the interpolar right kidney
approximately 1 cm likely a cyst. Small calculus in the upper pole
the right kidney 2-3 mm. No hydronephrosis. Urinary bladder is under
distended limiting assessment.

Stomach/Bowel: Gastrointestinal tract without signs of acute
abnormality. Normal appendix.

Vascular/Lymphatic: Calcified atheromatous plaque throughout the
abdominal aorta. Maximum caliber in the infrarenal segment
approximately 2.2 cm. No adenopathy in the retroperitoneum.

No pelvic lymphadenopathy.

Reproductive: Post hysterectomy. Subtle area of added density along
the anterior margin of the psoas appears to be contiguous with the
ovarian vein on the right. Margins are slightly ill-defined.

Other: There free air. No pneumatosis.

Musculoskeletal: Spinal degenerative changes without acute or
destructive bone process.
IMPRESSION: No acute finding in the abdomen or pelvis.

Bilateral adrenal thickening with more focal appearance on the left
favored to represent asymmetric adrenal hyperplasia. Adrenal adenoma
with lipid rich characteristics also considered. Biochemical
correlation may be helpful.

Low attenuation in the pancreatic head not meeting criteria for
cyst, follow-up pancreatic protocol is suggested; there are no acute
findings related to the pancreas.

Postoperative changes versus small ovarian remnant on the right
anterior to the right psoas muscle. Correlate with surgical history.
Follow-up imaging could be performed in 3-6 months as warranted.

These results were called by telephone at the time of interpretation
on 03/13/2020 at [DATE] to provider Dr. Crandell, who verbally
acknowledged these results.

Aortic Atherosclerosis ([id number]).

## 2021-08-07 ENCOUNTER — Ambulatory Visit (INDEPENDENT_AMBULATORY_CARE_PROVIDER_SITE_OTHER): Payer: Medicare Other | Admitting: Neurology

## 2021-08-07 ENCOUNTER — Encounter: Payer: Self-pay | Admitting: Neurology

## 2021-08-07 VITALS — BP 122/84 | HR 83 | Ht 60.0 in | Wt 139.0 lb

## 2021-08-07 DIAGNOSIS — R760 Raised antibody titer: Secondary | ICD-10-CM | POA: Diagnosis not present

## 2021-08-07 DIAGNOSIS — F17200 Nicotine dependence, unspecified, uncomplicated: Secondary | ICD-10-CM

## 2021-08-07 DIAGNOSIS — R799 Abnormal finding of blood chemistry, unspecified: Secondary | ICD-10-CM | POA: Diagnosis not present

## 2021-08-07 DIAGNOSIS — M546 Pain in thoracic spine: Secondary | ICD-10-CM

## 2021-08-07 DIAGNOSIS — R748 Abnormal levels of other serum enzymes: Secondary | ICD-10-CM | POA: Diagnosis not present

## 2021-08-07 DIAGNOSIS — E538 Deficiency of other specified B group vitamins: Secondary | ICD-10-CM | POA: Diagnosis not present

## 2021-08-07 DIAGNOSIS — R7989 Other specified abnormal findings of blood chemistry: Secondary | ICD-10-CM | POA: Diagnosis not present

## 2021-08-07 DIAGNOSIS — Z720 Tobacco use: Secondary | ICD-10-CM | POA: Insufficient documentation

## 2021-08-07 DIAGNOSIS — G8929 Other chronic pain: Secondary | ICD-10-CM | POA: Diagnosis not present

## 2021-08-07 MED ORDER — DULOXETINE HCL 30 MG PO CPEP: 30.0000 mg | ORAL_CAPSULE | Freq: Every day | ORAL | 6 refills | Status: DC

## 2021-08-07 NOTE — Progress Notes (Signed)
Chief Complaint  Patient presents with   New Patient (Initial Visit)    New room, last visit was in 2017. Patient reports she is here to discuss chronic pain located mostly in her right shoulder. Reports sx have been present now for a few months. Reports she cannot take OTC meds and has tried ice packs.       ASSESSMENT AND PLAN  Anita [name] is a 75 y.o. female   Upper back pain, intermittent bilateral upper extremity paresthesia  Previous MRI of thoracic spine in August 2020 showed mild dorsal cord deformity displacement at T7, suspect for underlying arachnoid web,  Laboratory evaluations for potential etiology  EMG nerve conduction study to rule out carpal tunnel syndromes, cervical radiculopathy  If needed may consider MRI of cervical spine  Try low-dose of Cymbalta 30 mg daily   DIAGNOSTIC DATA (LABS, IMAGING, TESTING) - I reviewed patient records, labs, notes, testing and imaging myself where available.  Laboratory evaluation in May 2022, A1c 5.1,  CBC, hemoglobin was decreased 11.5, which is about her baseline, with elevated RDW 18.1, CMP showed abnormal creatinine 1.63, lipid panel showed LDL of 60  CT abdomen in August 2021. 1. The region of concern in the pancreatic head on the prior exam is shown today with the benefit of IV contrast, and is less blurred by motion artifact today. The overall appearance is thought to be reassuring, with prior vague hypodensity probably representing blurring of the Santorini duct extending towards the minor papilla. 2. Left adrenal adenoma. 3. Cortical thinning in both kidneys compatible with atrophy. Hypodense lesions in both kidneys are generally too small to characterize although statistically likely to be benign. 4. 6 mm of degenerative anterolisthesis at L4-5 with disc uncovering and facet arthropathy contributing to mild right foraminal stenosis at this level. Mild central narrowing of the thecal sac is also suspected at  this level. 5. Aortic atherosclerosis.  MEDICAL HISTORY:  Anita [name] is a 75 year old female, seen in request by her primary care physician Dr. Pricilla Holm for evaluation of upper back pain, she is alone at today's visit August 07, 2021   I reviewed and summarized the referring note. PMHx HTN HLD. Smoke  1/2 PPD,   She reported many years history of chronic intermittent neck pain, upper back pain, it shifted location throughout the years, currently she complains of right shoulder pain radiating obliquely towards the left lower back, present for majority of the time, can be bothersome when she tried to be active, such as cooking at the kitchen, but there was no significant limitation in her daily activity, she denies gait abnormality, intermittent bilateral fingertips paresthesia, but no significant toes paresthesia, low back pain, gait abnormality, no bowel and bladder incontinence  I saw her previously in January 2017 for bilateral shoulder pain, at that time she complains of pain was mainly at the upper trapezius neck level, essentially normal neurological examination then, consider that her complaints of pain are due to musculoskeletal in etiology  MRI of thoracic spine showed mild distal cord deformity displacement at T7, suspect underlying arachnoid web, mild degenerative changes, without significant canal foraminal stenosis, the cord at T7 is subtle but convincingly anterior position relative to the remainder of the cord with slight deformity of the dorsal cord, this is seen on both the sagittal and axial imaging's, no alteration of flow-void to suggest a cyst or T2 hyper intense mass   PHYSICAL EXAM:   Vitals:   08/07/21 0832  BP:  122/84  Pulse: 83  SpO2: 99%  Weight: 139 lb (63 kg)  Height: 5' (1.524 m)   Not recorded     Body mass index is 27.15 kg/m.  PHYSICAL EXAMNIATION:  Gen: NAD, conversant, well nourised, well groomed                      Cardiovascular: Regular rate rhythm, no peripheral edema, warm, nontender. Eyes: Conjunctivae clear without exudates or hemorrhage Neck: Supple, no carotid bruits. Pulmonary: Clear to auscultation bilaterally  Musculoskeletal; mild paraspinal muscle tension at bilateral thoracic upper lumbar region, but no significant pain upon deep palpitation .  NEUROLOGICAL EXAM:  MENTAL STATUS: Speech:    Speech is normal; fluent and spontaneous with normal comprehension.  Cognition:     Orientation to time, place and person     Normal recent and remote memory     Normal Attention span and concentration     Normal Language, naming, repeating,spontaneous speech     Fund of knowledge   CRANIAL NERVES: CN II: Visual fields are full to confrontation. Pupils are round equal and briskly reactive to light. CN III, IV, VI: extraocular movement are normal. No ptosis. CN V: Facial sensation is intact to light touch CN VII: Face is symmetric with normal eye closure  CN VIII: Hearing is normal to causal conversation. CN IX, X: Phonation is normal. CN XI: Head turning and shoulder shrug are intact  MOTOR: There is no pronator drift of out-stretched arms. Muscle bulk and tone are normal. Muscle strength is normal.  REFLEXES: Reflexes are 2+ and symmetric at the biceps, triceps, knees, and ankles. Plantar responses are flexor.  SENSORY: Intact to light touch, pinprick and vibratory sensation are intact in fingers and toes.  COORDINATION: There is no trunk or limb dysmetria noted.  GAIT/STANCE: Posture is normal. Gait is steady with normal steps, base, arm swing, and turning. Heel and toe walking are normal. Tandem gait is normal.  Romberg is absent.  REVIEW OF SYSTEMS:  Full 14 system review of systems performed and notable only for as above All other review of systems were negative.   ALLERGIES: Allergies  Allergen Reactions   Pantoprazole Diarrhea    HOME MEDICATIONS: Current  Outpatient Medications  Medication Sig Dispense Refill   atorvastatin (LIPITOR) 40 MG tablet Take 1 tablet (40 mg total) by mouth at bedtime. 90 tablet 3   calcium-vitamin D (OSCAL WITH D) 250-125 MG-UNIT tablet Take 1 tablet by mouth daily.     cholecalciferol (VITAMIN D3) 25 MCG (1000 UNIT) tablet Take 1,000 Units by mouth daily.     ferrous sulfate 325 (65 FE) MG tablet Take 325 mg by mouth daily with breakfast.     OLMESARTAN MEDOXOMIL PO Take 12.5 mg by mouth.     SODIUM BICARBONATE PO Take 10 mg by mouth in the morning and at bedtime.     vitamin E 180 MG (400 UNITS) capsule Take 400 Units by mouth daily.     No current facility-administered medications for this visit.    PAST MEDICAL HISTORY: Past Medical History:  Diagnosis Date   Arthritis    Atherosclerosis of native artery of both lower extremities with intermittent claudication ([place])    vascular--- dr Trula Slade--- last ABIs 07-11-2018 in epic   Cervical cancer ([place])    squamous cell carcinoma    Cervical spondylosis    Chest pain 12-24-2019  per pt no chest pain since approx. 12-05-2019, pt stated only had chest  pain (no nausea, palpitations, irregular heartbeat, sob, difficulty breathing, sweating , or peripheral swelling)   per pt pcp note 11-28-2019, Dr Pricilla Holm, pt complaint with chest pain, Dr Sharlet Salina noted changes on her EKG done that day and with pt at high risk pt referred to cardiology/  pt was seen by Dr Gwenlyn Found 12-18-2019, note in epic, pt denied chest pain at this visit,  Dr Gwenlyn Found ordered carotid dopplers and echo and pt has appointment's scheduled   Chronic neck and back pain    midline thoracic back pain   CKD (chronic kidney disease), stage III ([place])    Dry eyes    Full dentures    GERD (gastroesophageal reflux disease)    12-24-2019 per pt occasionally ,  drinks water   Heart murmur    per Dr Gwenlyn Found assessment/ note on 12-18-2019   History of cervical dysplasia    per pt at age 66s had cervix  "scraped"   History of trichomonal vaginitis    12-04-2019  per pap smear,  treated   Hx of adenomatous polyp of colon 03/06/2015   Hyperlipidemia    Hypertension    followed by pcp   (12-24-2019 per pt never had a stress test)   IDA (iron deficiency anemia)    Postmenopausal bleeding    Spondylolisthesis of cervicothoracic region    steroid injection 08-23-2019    PAST SURGICAL HISTORY: Past Surgical History:  Procedure Laterality Date   CATARACT EXTRACTION W/ INTRAOCULAR LENS IMPLANT Right 2018   CERVICAL CONIZATION W/BX N/A 12/27/2019   Procedure: COLD KNIFE CONIZATION CERVIX WITH BIOPSY;  Surgeon: Lafonda Mosses, MD;  Location: [facility];  Service: Gynecology;  Laterality: N/A;   COLONOSCOPY  02/2015   DILATION AND CURETTAGE OF UTERUS N/A 12/27/2019   Procedure: ENDOCERVICAL CURETTAGE;  Surgeon: Lafonda Mosses, MD;  Location: [facility];  Service: Gynecology;  Laterality: N/A;   ESOPHAGOGASTRODUODENOSCOPY (EGD) WITH PROPOFOL N/A 03/13/2020   Procedure: ESOPHAGOGASTRODUODENOSCOPY (EGD) WITH PROPOFOL;  Surgeon: Irene Shipper, MD;  Location: [facility];  Service: Endoscopy;  Laterality: N/A;   HEMORRHOID SURGERY  1970s   HEMOSTASIS CLIP PLACEMENT  03/13/2020   Procedure: HEMOSTASIS CLIP PLACEMENT;  Surgeon: Irene Shipper, MD;  Location: [facility];  Service: Endoscopy;;   HOT HEMOSTASIS N/A 03/13/2020   Procedure: HOT HEMOSTASIS (ARGON PLASMA COAGULATION/BICAP);  Surgeon: Irene Shipper, MD;  Location: [facility];  Service: Endoscopy;  Laterality: N/A;   ORIF METATARSAL FRACTURE Left 06-04-2011   dr hewitt  '@[facility]'$    ORIF 3rd and 5th metatorsal shaft fractures/  closed treatment of 1st, 2nd, and 4th metatorsal fractures   ROBOTIC ASSISTED TOTAL HYSTERECTOMY WITH BILATERAL SALPINGO OOPHERECTOMY Bilateral 02/19/2020   Procedure: XI ROBOTIC ASSISTED TOTAL HYSTERECTOMY WITH BILATERAL SALPINGO OOPHORECTOMY;  Surgeon: Lafonda Mosses, MD;  Location: [facility];  Service: Gynecology;  Laterality: Bilateral;   SCLEROTHERAPY  03/13/2020   Procedure: SCLEROTHERAPY;  Surgeon: Irene Shipper, MD;  Location: [facility];  Service: Endoscopy;;   TUBAL LIGATION  yrs ago    FAMILY HISTORY: Family History  Problem Relation Age of Onset   Hypertension Mother    Arthritis Mother    Colon cancer Neg Hx    Esophageal cancer Neg Hx    Rectal cancer Neg Hx    Stomach cancer Neg Hx    Ovarian cancer Neg Hx    Uterine cancer Neg Hx    Breast cancer Neg Hx     SOCIAL  HISTORY: Social History   Socioeconomic History   Marital status: Single    Spouse name: Not on file   Number of children: 2   Years of education: 12+   Highest education level: Not on file  Occupational History   Occupation: Retired  Tobacco Use   Smoking status: Every Day    Packs/day: 0.50    Years: 59.00    Pack years: 29.50    Types: Cigarettes   Smokeless tobacco: Never   Tobacco comments:    since age 51  Vaping Use   Vaping Use: Never used  Substance and Sexual Activity   Alcohol use: Not Currently    Alcohol/week: 0.0 standard drinks    Comment: socially   Drug use: Never   Sexual activity: Not Currently    Birth control/protection: Post-menopausal, Surgical  Other Topics Concern   Not on file  Social History Narrative   Lives at home with her mother.   Right-handed.   Several sodas per day.   Social Determinants of Health   Financial Resource Strain: Not on file  Food Insecurity: Not on file  Transportation Needs: Not on file  Physical Activity: Not on file  Stress: Not on file  Social Connections: Not on file  Intimate Partner Violence: Not on file      Marcial Pacas, M.D. Ph.D.  [facility] [address], [place], [place] [address] Ph: [phone] Fax: [phone]  CC:  [name], MD [place],  [place] [id number]  [name], MD

## 2021-08-07 NOTE — Patient Instructions (Signed)
[facility]    Address: [address], [place], [place] [address]  Phone: [phone]   Chest x ray

## 2021-08-10 LAB — TSH: TSH: 9.58 u[IU]/mL — ABNORMAL HIGH (ref 0.450–4.500)

## 2021-08-10 LAB — ANA W/REFLEX IF POSITIVE
Anti JO-1: <0.2 AI (ref 0.0–0.9)
Anti Nuclear Antibody (ANA): POSITIVE — AB
Centromere Ab Screen: <0.2 AI (ref 0.0–0.9)
Chromatin Ab SerPl-aCnc: <0.2 AI (ref 0.0–0.9)
ENA RNP Ab: 1.4 AI — ABNORMAL HIGH (ref 0.0–0.9)
ENA SM Ab Ser-aCnc: <0.2 AI (ref 0.0–0.9)
ENA SSA (RO) Ab: <0.2 AI (ref 0.0–0.9)
ENA SSB (LA) Ab: <0.2 AI (ref 0.0–0.9)
Scleroderma (Scl-70) (ENA) Antibody, IgG: <0.2 AI (ref 0.0–0.9)
dsDNA Ab: <1 IU/mL (ref 0–9)

## 2021-08-10 LAB — CK: Total CK: 150 U/L (ref 32–182)

## 2021-08-10 LAB — RPR: RPR Ser Ql: NONREACTIVE

## 2021-08-10 LAB — VITAMIN B12: Vitamin B-12: 536 pg/mL (ref 232–1245)

## 2021-08-10 LAB — IRON AND TIBC
Iron Saturation: 21 % (ref 15–55)
Iron: 92 ug/dL (ref 27–139)
Total Iron Binding Capacity: 437 ug/dL (ref 250–450)
UIBC: 345 ug/dL (ref 118–369)

## 2021-08-10 LAB — FERRITIN: Ferritin: 34 ng/mL (ref 15–150)

## 2021-08-10 LAB — SEDIMENTATION RATE: Sed Rate: 28 mm/hr (ref 0–40)

## 2021-08-10 LAB — C-REACTIVE PROTEIN: CRP: <1 mg/L (ref 0–10)

## 2021-08-11 ENCOUNTER — Telehealth: Payer: Self-pay | Admitting: *Deleted

## 2021-08-11 NOTE — Telephone Encounter (Signed)
I spoke to the patient and provided her with lab results. She verbalized understanding and will follow up w/ PCP to address her abnormal TSH.

## 2021-08-11 NOTE — Telephone Encounter (Signed)
Please call patient, laboratory evaluation showed normal ESR, CPK, C-reactive protein, B12, RPR, BMP, A1c 5.1,  Elevated TSH 9.6, indicating hypothyroidism, I have forwarded labs to his primary care [name], MD   Positive ANA, with RNP antibody 1.4, has unknown clinical significance  She may contact primary care for repeat laboratory evaluation, potential hypothyroidism evaluation and treatment

## 2021-08-11 NOTE — Telephone Encounter (Signed)
From Dr. Krista Blue:  Please call patient, laboratory evaluation showed normal ESR, CPK, C-reactive protein, B12, RPR, BMP, A1c 5.1,   Elevated TSH 9.6, indicating hypothyroidism, I have forwarded labs to his primary care [name], MD    Positive ANA, with RNP antibody 1.4, has unknown clinical significance   She may contact primary care for repeat laboratory evaluation, potential hypothyroidism evaluation and treatment.

## 2021-08-11 NOTE — Telephone Encounter (Signed)
Left message requesting a return call. She may speak to anyone in POD 2.

## 2021-08-13 ENCOUNTER — Other Ambulatory Visit: Payer: Self-pay | Admitting: Neurology

## 2021-08-13 ENCOUNTER — Ambulatory Visit
Admission: RE | Admit: 2021-08-13 | Discharge: 2021-08-13 | Disposition: A | Discharge status: Home / Self Care | Payer: Medicare Other | Source: Ambulatory Visit | Attending: Neurology | Admitting: Neurology

## 2021-08-13 DIAGNOSIS — G8929 Other chronic pain: Secondary | ICD-10-CM

## 2021-08-13 DIAGNOSIS — M546 Pain in thoracic spine: Secondary | ICD-10-CM | POA: Diagnosis not present

## 2021-08-17 DIAGNOSIS — R809 Proteinuria, unspecified: Secondary | ICD-10-CM | POA: Diagnosis not present

## 2021-08-18 ENCOUNTER — Ambulatory Visit: Payer: Medicare Other | Admitting: Internal Medicine

## 2021-08-19 ENCOUNTER — Ambulatory Visit (INDEPENDENT_AMBULATORY_CARE_PROVIDER_SITE_OTHER): Payer: Medicare Other | Admitting: Internal Medicine

## 2021-08-19 ENCOUNTER — Encounter: Payer: Self-pay | Admitting: Internal Medicine

## 2021-08-19 ENCOUNTER — Other Ambulatory Visit: Payer: Self-pay

## 2021-08-19 ENCOUNTER — Ambulatory Visit: Payer: Medicare Other | Admitting: Internal Medicine

## 2021-08-19 VITALS — BP 132/70 | HR 74 | Resp 18 | Ht 60.0 in | Wt 136.4 lb

## 2021-08-19 DIAGNOSIS — R7989 Other specified abnormal findings of blood chemistry: Secondary | ICD-10-CM | POA: Insufficient documentation

## 2021-08-19 LAB — TSH: TSH: 9.25 u[IU]/mL — ABNORMAL HIGH (ref 0.35–5.50)

## 2021-08-19 LAB — T4, FREE: Free T4: 0.71 ng/dL (ref 0.60–1.60)

## 2021-08-19 NOTE — Assessment & Plan Note (Addendum)
TSH goal for age <10 which is elevated but not clearly abnormal. Will recheck TSH and free T4 today to assess potential need for synthroid. Thyroid not enlarged on exam.

## 2021-08-19 NOTE — Patient Instructions (Signed)
We will check the labs today and may need to start you on a thyroid medicine.

## 2021-08-19 NOTE — Progress Notes (Signed)
   Subjective:   Patient ID: Anita [name], female    DOB: 1946/07/24, 75 y.o.   MRN: [id number]  HPI The patient is a 75 YO female coming in for concerns about thyroid. Had abnormal TSH with neurology to >9. She is fatigued and gets cold easily and has some constipation (not new).  Review of Systems  Constitutional:  Positive for fatigue.  HENT: Negative.    Eyes: Negative.   Respiratory:  Negative for cough, chest tightness and shortness of breath.   Cardiovascular:  Negative for chest pain, palpitations and leg swelling.  Gastrointestinal:  Negative for abdominal distention, abdominal pain, constipation, diarrhea, nausea and vomiting.  Endocrine: Positive for cold intolerance.  Musculoskeletal:  Positive for arthralgias and myalgias.  Skin: Negative.   Neurological: Negative.   Psychiatric/Behavioral: Negative.     Objective:  Physical Exam Constitutional:      Appearance: She is well-developed.  HENT:     Head: Normocephalic and atraumatic.  Cardiovascular:     Rate and Rhythm: Normal rate and regular rhythm.  Pulmonary:     Effort: Pulmonary effort is normal. No respiratory distress.     Breath sounds: Normal breath sounds. No wheezing or rales.  Abdominal:     General: Bowel sounds are normal. There is no distension.     Palpations: Abdomen is soft.     Tenderness: There is no abdominal tenderness. There is no rebound.  Musculoskeletal:        General: Tenderness present.     Cervical back: Normal range of motion.  Skin:    General: Skin is warm and dry.  Neurological:     Mental Status: She is alert and oriented to person, place, and time.     Coordination: Coordination normal.    Vitals:   08/19/21 0827  BP: 132/70  Pulse: 74  Resp: 18  SpO2: 96%  Weight: 136 lb 6.4 oz (61.9 kg)  Height: 5' (1.524 m)    This visit occurred during the SARS-CoV-2 public health emergency.  Safety protocols were in place, including screening questions prior to the visit,  additional usage of staff PPE, and extensive cleaning of exam room while observing appropriate contact time as indicated for disinfecting solutions.   Assessment & Plan:

## 2021-08-20 ENCOUNTER — Other Ambulatory Visit: Payer: Self-pay | Admitting: Internal Medicine

## 2021-08-20 DIAGNOSIS — E039 Hypothyroidism, unspecified: Secondary | ICD-10-CM

## 2021-08-20 MED ORDER — LEVOTHYROXINE SODIUM 25 MCG PO TABS: 25.0000 ug | ORAL_TABLET | Freq: Every day | ORAL | 3 refills | Status: DC

## 2021-08-27 ENCOUNTER — Telehealth: Payer: Self-pay | Admitting: Neurology

## 2021-08-27 MED ORDER — TIZANIDINE HCL 4 MG PO TABS: 4.0000 mg | ORAL_TABLET | Freq: Three times a day (TID) | ORAL | 3 refills | Status: DC | PRN

## 2021-08-27 NOTE — Telephone Encounter (Signed)
Left message for a return call

## 2021-08-27 NOTE — Addendum Note (Signed)
Addended by: Marcial Pacas on: 08/27/2021 10:48 AM   Modules accepted: Orders

## 2021-08-27 NOTE — Telephone Encounter (Signed)
Meds ordered this encounter  Medications   tiZANidine (ZANAFLEX) 4 MG tablet    Sig: Take 1 tablet (4 mg total) by mouth every 8 (eight) hours as needed for muscle spasms.    Dispense:  60 tablet    Refill:  3     May mix it with Aleve as needed.

## 2021-08-27 NOTE — Telephone Encounter (Signed)
Pt called, took DULoxetine (CYMBALTA) 30 MG capsule yesterday, started have muscle cramps, and level 10 back pain. Have not taken medication today. Would like a call from the nurse.

## 2021-08-27 NOTE — Telephone Encounter (Signed)
Patient returned call and stated that she only has taken 2 doses of the Cymbalta since 08/07/2021.  Stated that it has given her 10/10 back pain and she is now 'down in her back'.  She is requesting something for the pain.  I explained to the patient that we do not manage narcotics and patient just stated that she would just call her PCP for pain medication.

## 2021-09-16 ENCOUNTER — Encounter: Payer: Self-pay | Admitting: *Deleted

## 2021-09-23 DIAGNOSIS — N1832 Chronic kidney disease, stage 3b: Secondary | ICD-10-CM | POA: Diagnosis not present

## 2021-09-25 ENCOUNTER — Other Ambulatory Visit: Payer: Self-pay | Admitting: Internal Medicine

## 2021-09-25 DIAGNOSIS — Z1231 Encounter for screening mammogram for malignant neoplasm of breast: Secondary | ICD-10-CM

## 2021-09-30 ENCOUNTER — Ambulatory Visit (INDEPENDENT_AMBULATORY_CARE_PROVIDER_SITE_OTHER): Payer: Medicare Other | Admitting: Neurology

## 2021-09-30 ENCOUNTER — Other Ambulatory Visit: Payer: Self-pay

## 2021-09-30 DIAGNOSIS — R202 Paresthesia of skin: Secondary | ICD-10-CM | POA: Diagnosis not present

## 2021-09-30 DIAGNOSIS — M542 Cervicalgia: Secondary | ICD-10-CM

## 2021-09-30 DIAGNOSIS — M546 Pain in thoracic spine: Secondary | ICD-10-CM

## 2021-09-30 DIAGNOSIS — G8929 Other chronic pain: Secondary | ICD-10-CM

## 2021-09-30 MED ORDER — TIZANIDINE HCL 4 MG PO TABS: 4.0000 mg | ORAL_TABLET | Freq: Four times a day (QID) | ORAL | 3 refills | Status: DC | PRN

## 2021-09-30 MED ORDER — DULOXETINE HCL 60 MG PO CPEP: 60.0000 mg | ORAL_CAPSULE | Freq: Every day | ORAL | 6 refills | Status: DC
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
## 2021-09-30 NOTE — Procedures (Signed)
Full Name: Anita [name] Gender: Female MRN #: [id number] Date of Birth: December 10, 1946    Visit Date: 09/30/2021 08:14 Age: 75 Years Examining Physician: Marcial Pacas, MD  Referring Physician: Marcial Pacas, MD History: 75 year old female, with left thoracic paraspinal muscle tenderness, bilateral shoulder pain, intermittent upper extremity paresthesia subjective weakness  Summary of the test: Nerve conduction study: Bilateral median, ulnar sensory and motor responses were normal.  Bilateral median mixed response were within normal limits  Electromyography: Selected needle examination of left upper extremity, cervical paraspinal, left mid thoracic paraspinal muscles were normal   Conclusion: This is a normal study.  There is no electrodiagnostic evidence of bilateral upper extremity focal neuropathy, left cervical radiculopathy, or left or thoracic radiculopathy.    ------------------------------- Marcial Pacas M.D. PhD  [facility] [address], [place], [place] [address] Tel: [phone] Fax: [phone]  Verbal informed consent was obtained from the patient, patient was informed of potential risk of procedure, including bruising, bleeding, hematoma formation, infection, muscle weakness, muscle pain, numbness, among others.        [place]    Nerve / Sites Muscle Latency Ref. Amplitude Ref. Rel Amp Segments Distance Velocity Ref. Area    ms ms mV mV %  cm m/s m/s mVms  R Median - APB     Wrist APB 3.8 ?4.4 8.8 ?4.0 100 Wrist - APB 7   26.8     Upper arm APB 7.9  7.7  88.1 Upper arm - Wrist 20 49 ?49 23.6  L Median - APB     Wrist APB 4.4 ?4.4 7.4 ?4.0 100 Wrist - APB 7   25.3     Upper arm APB 8.5  6.9  92.5 Upper arm - Wrist 20 49 ?49 25.0  R Ulnar - ADM     Wrist ADM 2.6 ?3.3 14.2 ?6.0 100 Wrist - ADM 7   38.8     B.Elbow ADM 6.3  12.5  88.5 B.Elbow - Wrist 18 49 ?49 37.0     A.Elbow ADM 8.4  12.1  96.7 A.Elbow - B.Elbow 10 49 ?49 37.4  L Ulnar  - ADM     Wrist ADM 2.8 ?3.3 13.3 ?6.0 100 Wrist - ADM 7   33.0     B.Elbow ADM 6.3  12.2  91.3 B.Elbow - Wrist 17 49 ?49 30.4     A.Elbow ADM 8.3  12.3  101 A.Elbow - B.Elbow 10 49 ?49 32.5             SNC    Nerve / Sites Rec. Site Peak Lat Ref.  Amp Ref. Segments Distance Peak Diff Ref.    ms ms V V  cm ms ms  R Median, Ulnar - Transcarpal comparison     Median Palm Wrist 2.4 ?2.2 58 ?35 Median Palm - Wrist 8       Ulnar Palm Wrist 2.5 ?2.2 13 ?12 Ulnar Palm - Wrist 8          Median Palm - Ulnar Palm  -0.1 ?0.4  L Median, Ulnar - Transcarpal comparison     Median Palm Wrist 2.3 ?2.2 59 ?35 Median Palm - Wrist 8       Ulnar Palm Wrist 2.3 ?2.2 24 ?12 Ulnar Palm - Wrist 8          Median Palm - Ulnar Palm  0.0 ?0.4  R Median - Orthodromic (Dig II, Mid palm)  Dig II Wrist 3.4 ?3.4 14 ?10 Dig II - Wrist 13    L Median - Orthodromic (Dig II, Mid palm)     Dig II Wrist 3.3 ?3.4 14 ?10 Dig II - Wrist 13    R Ulnar - Orthodromic, (Dig V, Mid palm)     Dig V Wrist 3.1 ?3.1 9 ?5 Dig V - Wrist 11    L Ulnar - Orthodromic, (Dig V, Mid palm)     Dig V Wrist 3.1 ?3.1 8 ?5 Dig V - Wrist 85                   F  Wave    Nerve F Lat Ref.   ms ms  R Ulnar - ADM 30.5 ?32.0  L Ulnar - ADM 31.1 ?32.0         EMG Summary Table    Spontaneous MUAP Recruitment  Muscle IA Fib PSW Fasc Other Amp Dur. Poly Pattern  L. Pronator quadratus Normal None None None _______ Normal Normal Normal Normal  L. Brachioradialis Normal None None None _______ Normal Normal Normal Normal  L. Biceps brachii Normal None None None _______ Normal Normal Normal Normal  L. Deltoid Normal None None None _______ Normal Normal Normal Normal  L. First dorsal interosseous Normal None None None _______ Normal Normal Normal Normal  L. Cervical paraspinals Normal None None None _______ Normal Normal Normal Normal  L. Thoracic paraspinals (mid) Normal None None None _______ Normal Normal Normal Normal

## 2021-10-02 ENCOUNTER — Ambulatory Visit (INDEPENDENT_AMBULATORY_CARE_PROVIDER_SITE_OTHER): Payer: Medicare Other | Admitting: Obstetrics and Gynecology

## 2021-10-02 ENCOUNTER — Encounter: Payer: Self-pay | Admitting: Obstetrics and Gynecology

## 2021-10-02 ENCOUNTER — Other Ambulatory Visit: Payer: Self-pay

## 2021-10-02 ENCOUNTER — Other Ambulatory Visit (HOSPITAL_COMMUNITY)
Admission: RE | Admit: 2021-10-02 | Discharge: 2021-10-02 | Disposition: A | Discharge status: Home / Self Care | Payer: Medicare Other | Source: Ambulatory Visit | Attending: Obstetrics and Gynecology | Admitting: Obstetrics and Gynecology

## 2021-10-02 VITALS — BP 162/75 | HR 87 | Wt 137.7 lb

## 2021-10-02 DIAGNOSIS — Z01419 Encounter for gynecological examination (general) (routine) without abnormal findings: Secondary | ICD-10-CM | POA: Diagnosis present

## 2021-10-02 DIAGNOSIS — Z8541 Personal history of malignant neoplasm of cervix uteri: Secondary | ICD-10-CM | POA: Insufficient documentation

## 2021-10-02 DIAGNOSIS — D069 Carcinoma in situ of cervix, unspecified: Secondary | ICD-10-CM | POA: Diagnosis not present

## 2021-10-02 DIAGNOSIS — Z1151 Encounter for screening for human papillomavirus (HPV): Secondary | ICD-10-CM | POA: Diagnosis not present

## 2021-10-02 NOTE — Progress Notes (Signed)
Obstetrics and Gynecology Visit Return Patient Evaluation  Appointment Date: 10/02/2021  Primary Care Provider: [place], [place] [facility]  Chief Complaint: yearly, surveillance pap  History of Present Illness:  CIGI BEGA is a 75 y.o. s/p 02/2020 Gyn Onc TLH/BSO for CIS on [facility]. Final pathology CIN 1 at the cx  Interval History: Since that time, she states that she has no GYN s/s  Review of Systems: as noted in the History of Present Illness.  Patient Active Problem List   Diagnosis Date Noted   Carcinoma in situ of cervix 10/02/2021   Paresthesia 09/30/2021   Elevated TSH 08/19/2021   Chronic bilateral thoracic back pain 08/07/2021   Smoker 08/07/2021   Aortic atherosclerosis ([place]) 05/13/2021   Chronic kidney disease, stage 3b ([place]) 07/28/2020   Hyperlipidemia 04/22/2020   Carotid artery disease ([place]) 04/22/2020   Cardiac murmur 04/22/2020   Dieulafoy lesion of stomach    Dieulafoy lesion of small intestine    Oxygen desaturation 02/20/2020   Cervical dysplasia 12/19/2019   Vulvar atrophy 12/10/2019   Postmenopausal bleeding 12/06/2019   Hypertension 11/28/2019   Cyst of skin 10/30/2019   Cervicalgia 08/07/2019   Lipoma 06/29/2018   Claudication ([place]) 06/29/2018   Neck pain 06/29/2018   Smokers' cough ([place]) 09/03/2016   Shoulder pain 12/23/2015   Hx of adenomatous polyp of colon 03/06/2015   Back pain 12/06/2014   Routine general medical examination at a health care facility 12/06/2014   Medications:  Deztiny E. Govea had no medications administered during this visit. Current Outpatient Medications  Medication Sig Dispense Refill   atorvastatin (LIPITOR) 40 MG tablet Take 1 tablet (40 mg total) by mouth at bedtime. 90 tablet 3   calcium-vitamin D (OSCAL WITH D) 250-125 MG-UNIT tablet Take 1 tablet by mouth daily.     cholecalciferol (VITAMIN D3) 25 MCG (1000 UNIT) tablet Take 1,000 Units by mouth  daily.     ferrous sulfate 325 (65 FE) MG tablet Take 325 mg by mouth daily with breakfast.     levothyroxine (SYNTHROID) 25 MCG tablet Take 1 tablet (25 mcg total) by mouth daily. 90 tablet 3   LOKELMA 5 g packet Take 1 packet by mouth daily.     OLMESARTAN MEDOXOMIL PO Take 12.5 mg by mouth.     SODIUM BICARBONATE PO Take 10 mg by mouth in the morning and at bedtime.     DULoxetine (CYMBALTA) 30 MG capsule Take 1 capsule (30 mg total) by mouth daily. 30 capsule 6   DULoxetine (CYMBALTA) 60 MG capsule Take 1 capsule (60 mg total) by mouth daily. (Patient not taking: Reported on 10/02/2021) 30 capsule 6   tiZANidine (ZANAFLEX) 4 MG tablet Take 1 tablet (4 mg total) by mouth every 8 (eight) hours as needed for muscle spasms. (Patient not taking: Reported on 10/02/2021) 60 tablet 3   tiZANidine (ZANAFLEX) 4 MG tablet Take 1 tablet (4 mg total) by mouth every 6 (six) hours as needed for muscle spasms. (Patient not taking: Reported on 10/02/2021) 30 tablet 3   vitamin E 180 MG (400 UNITS) capsule Take 400 Units by mouth daily. (Patient not taking: Reported on 10/02/2021)     No current facility-administered medications for this visit.    Allergies: is allergic to pantoprazole.  Physical Exam:  BP (!) 162/75   Pulse 87   Wt 137 lb 11.2 oz (62.5 kg)   BMI 26.89 kg/m  Body mass index is 26.89  kg/m. General appearance: Well nourished, well developed female in no acute distress.  Abdomen: diffusely non tender to palpation, non distended, and no masses, hernias Neuro/Psych:  Normal mood and affect.    Pelvic exam:  EGBUS: normal, moderat atrophy Vaginal vault: normal, moderate atrophy; no VB or d/c Cuff: normal, moderate atrophy Bimanual: negative   Assessment: pt doing well  Plan:  1. Carcinoma in situ of cervix, unspecified location D/w her recommend yearly paps for the foreseeable future and to call us for any GYN s/s in the interim. - Cytology - PAP( [place])   RTC: 1  year  Durene Romans MD Attending Center for [employer])

## 2021-10-07 LAB — CYTOLOGY - PAP
Comment: NEGATIVE
Diagnosis: NEGATIVE
High risk HPV: NEGATIVE

## 2021-10-19 DIAGNOSIS — N1832 Chronic kidney disease, stage 3b: Secondary | ICD-10-CM | POA: Diagnosis not present

## 2021-10-22 ENCOUNTER — Other Ambulatory Visit: Payer: Self-pay

## 2021-10-22 ENCOUNTER — Ambulatory Visit
Admission: RE | Admit: 2021-10-22 | Discharge: 2021-10-22 | Disposition: A | Discharge status: Home / Self Care | Payer: Medicare Other | Source: Ambulatory Visit | Attending: Internal Medicine | Admitting: Internal Medicine

## 2021-10-22 DIAGNOSIS — Z1231 Encounter for screening mammogram for malignant neoplasm of breast: Secondary | ICD-10-CM | POA: Diagnosis not present

## 2021-10-28 DIAGNOSIS — I129 Hypertensive chronic kidney disease with stage 1 through stage 4 chronic kidney disease, or unspecified chronic kidney disease: Secondary | ICD-10-CM | POA: Diagnosis not present

## 2021-10-28 DIAGNOSIS — N1832 Chronic kidney disease, stage 3b: Secondary | ICD-10-CM | POA: Diagnosis not present

## 2021-10-28 DIAGNOSIS — E875 Hyperkalemia: Secondary | ICD-10-CM | POA: Diagnosis not present

## 2021-10-28 DIAGNOSIS — F17200 Nicotine dependence, unspecified, uncomplicated: Secondary | ICD-10-CM | POA: Diagnosis not present

## 2021-10-28 DIAGNOSIS — E872 Acidosis, unspecified: Secondary | ICD-10-CM | POA: Diagnosis not present

## 2021-10-28 DIAGNOSIS — E213 Hyperparathyroidism, unspecified: Secondary | ICD-10-CM | POA: Diagnosis not present

## 2021-11-18 DIAGNOSIS — N1832 Chronic kidney disease, stage 3b: Secondary | ICD-10-CM | POA: Diagnosis not present

## 2021-11-22 ENCOUNTER — Encounter: Payer: Self-pay | Admitting: Internal Medicine

## 2021-11-26 ENCOUNTER — Other Ambulatory Visit: Payer: Self-pay

## 2021-11-26 ENCOUNTER — Other Ambulatory Visit (INDEPENDENT_AMBULATORY_CARE_PROVIDER_SITE_OTHER): Payer: Medicare Other

## 2021-11-26 DIAGNOSIS — E875 Hyperkalemia: Secondary | ICD-10-CM

## 2021-11-26 DIAGNOSIS — E039 Hypothyroidism, unspecified: Secondary | ICD-10-CM

## 2021-11-26 LAB — BASIC METABOLIC PANEL
BUN: 65 mg/dL — ABNORMAL HIGH (ref 6–23)
CO2: 23 mEq/L (ref 19–32)
Calcium: 9.6 mg/dL (ref 8.4–10.5)
Chloride: 105 mEq/L (ref 96–112)
Creatinine, Ser: 1.98 mg/dL — ABNORMAL HIGH (ref 0.40–1.20)
GFR: 24.67 mL/min — ABNORMAL LOW (ref >60.00)
Glucose, Bld: 87 mg/dL (ref 70–99)
Potassium: 4.4 mEq/L (ref 3.5–5.1)
Sodium: 132 mEq/L — ABNORMAL LOW (ref 135–145)

## 2021-11-26 LAB — T4, FREE: Free T4: 0.85 ng/dL (ref 0.60–1.60)

## 2021-11-26 LAB — TSH: TSH: 10.17 u[IU]/mL — ABNORMAL HIGH (ref 0.35–5.50)

## 2021-11-30 ENCOUNTER — Encounter: Payer: Self-pay | Admitting: Internal Medicine

## 2021-11-30 NOTE — Telephone Encounter (Signed)
Pt has stated the Levothyroxine is to strong and has been causing her to have headaches, chest pressure and foggy memory.  She wants to take something else that will not cause her to have the above side effects.

## 2021-11-30 NOTE — Telephone Encounter (Signed)
Patient calling in  Says she was returning call from [place] patient nurse was currently in w/ another patient & would call back when available  Callback [phone]

## 2021-12-01 ENCOUNTER — Encounter: Payer: Self-pay | Admitting: Orthopaedic Surgery

## 2021-12-01 ENCOUNTER — Ambulatory Visit (INDEPENDENT_AMBULATORY_CARE_PROVIDER_SITE_OTHER): Payer: Medicare Other | Admitting: Orthopaedic Surgery

## 2021-12-01 ENCOUNTER — Ambulatory Visit: Payer: Self-pay

## 2021-12-01 DIAGNOSIS — M25361 Other instability, right knee: Secondary | ICD-10-CM

## 2021-12-01 NOTE — Progress Notes (Signed)
The patient is a 75 year old female who comes in today saying that her right knee just gives way on her.  She also feels like the left knee does that.  She is never injured her knees that she is aware of and has never had surgery.  She says she feels like the right knee is swollen or just looks different.  She does not walk with assistive device.  On exam both knees have just slight varus malalignment.  I can see what she is talking about in terms of the difference of how her right knee looks anatomically.  There is no effusion I try to aspirate her knee and there is no effusion in the right knee.  Both knees have good range of motion and are ligamentously lax.  There is no muscle atrophy.  An AP and lateral the right knee standing shows a left knee as well.  There is moderate arthritic changes in both knees.  She is not having pain and the only thing I would recommend for her knees really are just over-the-counter knee braces and physical therapy as an outpatient for quad strengthening exercises.  [facility].  From my standpoint then follow-up can be as needed.  If she gets to the point where that is not helping I would recommend formal outpatient physical therapy.

## 2021-12-08 ENCOUNTER — Ambulatory Visit: Payer: Medicare Other | Admitting: Internal Medicine

## 2021-12-18 ENCOUNTER — Encounter: Payer: Self-pay | Admitting: Internal Medicine

## 2021-12-18 ENCOUNTER — Other Ambulatory Visit: Payer: Self-pay

## 2021-12-18 ENCOUNTER — Ambulatory Visit (INDEPENDENT_AMBULATORY_CARE_PROVIDER_SITE_OTHER): Payer: Medicare Other | Admitting: Internal Medicine

## 2021-12-18 VITALS — BP 130/70 | HR 95 | Resp 18 | Ht 60.0 in | Wt 135.8 lb

## 2021-12-18 DIAGNOSIS — E782 Mixed hyperlipidemia: Secondary | ICD-10-CM

## 2021-12-18 DIAGNOSIS — N1832 Chronic kidney disease, stage 3b: Secondary | ICD-10-CM | POA: Diagnosis not present

## 2021-12-18 DIAGNOSIS — R7989 Other specified abnormal findings of blood chemistry: Secondary | ICD-10-CM

## 2021-12-18 LAB — RENAL FUNCTION PANEL
Albumin: 4.1 g/dL (ref 3.5–5.2)
BUN: 50 mg/dL — ABNORMAL HIGH (ref 6–23)
CO2: 19 mEq/L (ref 19–32)
Calcium: 10 mg/dL (ref 8.4–10.5)
Chloride: 112 mEq/L (ref 96–112)
Creatinine, Ser: 2.18 mg/dL — ABNORMAL HIGH (ref 0.40–1.20)
GFR: 21.97 mL/min — ABNORMAL LOW (ref >60.00)
Glucose, Bld: 87 mg/dL (ref 70–99)
Phosphorus: 4.3 mg/dL (ref 2.3–4.6)
Potassium: 5 mEq/L (ref 3.5–5.1)
Sodium: 138 mEq/L (ref 135–145)

## 2021-12-18 LAB — TSH: TSH: 6.42 u[IU]/mL — ABNORMAL HIGH (ref 0.35–5.50)

## 2021-12-18 LAB — T4, FREE: Free T4: 0.83 ng/dL (ref 0.60–1.60)

## 2021-12-18 NOTE — Patient Instructions (Signed)
We will recheck the thyroid levels.   We will have you stop the lipitor (atorvastatin) and give this 2-4 weeks. Then let us know if you are feeling better.

## 2021-12-18 NOTE — Assessment & Plan Note (Signed)
She wishes to stop atorvastatin for 1 month and see if cramps improve. We will have her let us know how she is doing in 1 month. If muscle/joint pain does improve we can consider change. If no change will resume atorvastatin.

## 2021-12-18 NOTE — Assessment & Plan Note (Signed)
She did stop synthroid 25 mcg daily about 1 month ago due to side effects. Rechecking TSH and free T4 and adjust as needed.

## 2021-12-18 NOTE — Progress Notes (Signed)
° °  Subjective:   Patient ID: Anita [name], female    DOB: [birth year], 75 y.o.   MRN: [id number]  HPI The patient is a 75 YO female coming in for follow up.   Review of Systems  Constitutional: Negative.   HENT: Negative.    Eyes: Negative.   Respiratory:  Negative for cough, chest tightness and shortness of breath.   Cardiovascular:  Negative for chest pain, palpitations and leg swelling.  Gastrointestinal:  Negative for abdominal distention, abdominal pain, constipation, diarrhea, nausea and vomiting.  Musculoskeletal: Negative.   Skin: Negative.   Neurological: Negative.   Psychiatric/Behavioral: Negative.     Objective:  Physical Exam Constitutional:      Appearance: She is well-developed.  HENT:     Head: Normocephalic and atraumatic.  Cardiovascular:     Rate and Rhythm: Normal rate and regular rhythm.  Pulmonary:     Effort: Pulmonary effort is normal. No respiratory distress.     Breath sounds: Normal breath sounds. No wheezing or rales.  Abdominal:     General: Bowel sounds are normal. There is no distension.     Palpations: Abdomen is soft.     Tenderness: There is no abdominal tenderness. There is no rebound.  Musculoskeletal:     Cervical back: Normal range of motion.  Skin:    General: Skin is warm and dry.  Neurological:     Mental Status: She is alert and oriented to person, place, and time.     Coordination: Coordination normal.    Vitals:   12/18/21 0849  BP: 130/70  Pulse: 95  Resp: 18  SpO2: 100%  Weight: 135 lb 12.8 oz (61.6 kg)  Height: 5' (1.524 m)    This visit occurred during the SARS-CoV-2 public health emergency.  Safety protocols were in place, including screening questions prior to the visit, additional usage of staff PPE, and extensive cleaning of exam room while observing appropriate contact time as indicated for disinfecting solutions.   Assessment & Plan:

## 2021-12-18 NOTE — Assessment & Plan Note (Signed)
Needs follow up renal function panel for stability.

## 2021-12-25 DIAGNOSIS — R6889 Other general symptoms and signs: Secondary | ICD-10-CM | POA: Diagnosis not present

## 2022-01-12 ENCOUNTER — Encounter: Payer: Self-pay | Admitting: Internal Medicine

## 2022-02-03 ENCOUNTER — Other Ambulatory Visit: Payer: Self-pay

## 2022-02-03 ENCOUNTER — Ambulatory Visit (INDEPENDENT_AMBULATORY_CARE_PROVIDER_SITE_OTHER): Payer: Medicare HMO | Admitting: Orthopaedic Surgery

## 2022-02-03 ENCOUNTER — Ambulatory Visit (INDEPENDENT_AMBULATORY_CARE_PROVIDER_SITE_OTHER): Payer: Medicare HMO

## 2022-02-03 DIAGNOSIS — G8929 Other chronic pain: Secondary | ICD-10-CM | POA: Diagnosis not present

## 2022-02-03 DIAGNOSIS — M545 Low back pain, unspecified: Secondary | ICD-10-CM

## 2022-02-03 MED ORDER — MELOXICAM 15 MG PO TABS: 15.0000 mg | ORAL_TABLET | Freq: Every day | ORAL | 1 refills | Status: DC

## 2022-02-03 MED ORDER — HYDROCODONE-ACETAMINOPHEN 5-325 MG PO TABS: 1.0000 | ORAL_TABLET | Freq: Four times a day (QID) | ORAL | 0 refills | Status: DC | PRN

## 2022-02-03 NOTE — Progress Notes (Signed)
The patient is a 76 year old female who comes in with low back pain the left side that has been chronic for her but on and off and is flared up in terms of pain.  She denies any injury.  She is never had back injections and is hesitant to take any steroids.  She is asking for antibiotics.  I did explain to her that antibiotics are not something we usually prescribe in this practice unless someone is dealing with an infection.  She describes more of muscle type of pain.  She does appear uncomfortable with her back.  It is on exam musculoskeletal related on my exam today of her lower back with flexion extension as well as positive straight leg raise to the left side.  2 views of the lumbar spine show just a slight spondylolisthesis between L4 and L5 but otherwise good alignment.  I feel be best to put her on a steroid but she said she feels like a steroid injection that she had 1 time because of an STD which I reassured her that it probably did not however she still averse to any type of steroids.  She is not a diabetic.  I will send in some meloxicam and hydrocodone.  I did talk to her about a muscle relaxant but she said she would rather not have that.  I told her this would be a one-time prescription for hydrocodone.  I recommended physical therapy but she deferred this as well saying that she knows how to do exercises.  We will try the medications and I can see her back in 2 weeks to see how she is doing overall.  If she does feel like she needs an antibiotic I suggest she contact her primary care physician.

## 2022-02-08 DIAGNOSIS — R6889 Other general symptoms and signs: Secondary | ICD-10-CM | POA: Diagnosis not present

## 2022-02-10 ENCOUNTER — Encounter: Payer: Self-pay | Admitting: Internal Medicine

## 2022-02-17 ENCOUNTER — Encounter: Payer: Self-pay | Admitting: Orthopaedic Surgery

## 2022-02-17 ENCOUNTER — Ambulatory Visit: Payer: Medicare HMO | Admitting: Orthopaedic Surgery

## 2022-02-17 DIAGNOSIS — G8929 Other chronic pain: Secondary | ICD-10-CM | POA: Diagnosis not present

## 2022-02-17 DIAGNOSIS — M545 Low back pain, unspecified: Secondary | ICD-10-CM | POA: Diagnosis not present

## 2022-02-17 MED ORDER — HYDROCODONE-ACETAMINOPHEN 5-325 MG PO TABS: 1.0000 | ORAL_TABLET | Freq: Three times a day (TID) | ORAL | 0 refills | Status: AC | PRN

## 2022-02-17 NOTE — Progress Notes (Signed)
The patient comes in today for follow-up 2 weeks after I saw her for an acute flareup of some chronic low back pain.  I have recommended a steroid but she deferred this.  I recommended physical therapy but she also deferred this.  She only wanted some pain medications put her on some hydrocodone but only for short-term.  She says she is better but the pain still comes and goes.  Even at her last visit she wanted to be on antibiotic but I told her she did see her primary care physician for this.  She decided not to do that.  She denies any change in bowel bladder function.  She says that she still would not like to go to physical therapy without having continued pain medication. ? ?She has negative straight leg raise bilaterally today with good strength in her bilateral extremities.  Her pain is only across the lower lumbar spine. ? ?I will also what else I can recommend.  I will refill hydrocodone only 1 more time but after that we will not refill it again.  My recommendations of the physical therapy and anti-inflammatories.  If her pain proceeded we would recommend an MRI of her lumbar spine but she said she would not want any type of injections or surgery.  With that being said, follow-up is as needed. ?

## 2022-02-18 DIAGNOSIS — F17200 Nicotine dependence, unspecified, uncomplicated: Secondary | ICD-10-CM | POA: Diagnosis not present

## 2022-02-18 DIAGNOSIS — I129 Hypertensive chronic kidney disease with stage 1 through stage 4 chronic kidney disease, or unspecified chronic kidney disease: Secondary | ICD-10-CM | POA: Diagnosis not present

## 2022-02-18 DIAGNOSIS — E875 Hyperkalemia: Secondary | ICD-10-CM | POA: Diagnosis not present

## 2022-02-18 DIAGNOSIS — E213 Hyperparathyroidism, unspecified: Secondary | ICD-10-CM | POA: Diagnosis not present

## 2022-02-18 DIAGNOSIS — E872 Acidosis, unspecified: Secondary | ICD-10-CM | POA: Diagnosis not present

## 2022-02-18 DIAGNOSIS — R42 Dizziness and giddiness: Secondary | ICD-10-CM | POA: Diagnosis not present

## 2022-02-18 DIAGNOSIS — N1832 Chronic kidney disease, stage 3b: Secondary | ICD-10-CM | POA: Diagnosis not present

## 2022-03-05 DIAGNOSIS — N1832 Chronic kidney disease, stage 3b: Secondary | ICD-10-CM | POA: Diagnosis not present

## 2022-03-05 DIAGNOSIS — R6889 Other general symptoms and signs: Secondary | ICD-10-CM | POA: Diagnosis not present

## 2022-03-05 DIAGNOSIS — N189 Chronic kidney disease, unspecified: Secondary | ICD-10-CM | POA: Diagnosis not present

## 2022-03-18 DIAGNOSIS — R6889 Other general symptoms and signs: Secondary | ICD-10-CM | POA: Diagnosis not present

## 2022-03-18 DIAGNOSIS — N189 Chronic kidney disease, unspecified: Secondary | ICD-10-CM | POA: Diagnosis not present

## 2022-03-23 ENCOUNTER — Encounter: Payer: Self-pay | Admitting: Internal Medicine

## 2022-03-23 ENCOUNTER — Ambulatory Visit (INDEPENDENT_AMBULATORY_CARE_PROVIDER_SITE_OTHER): Payer: Medicare HMO | Admitting: Internal Medicine

## 2022-03-23 VITALS — BP 122/68 | HR 71 | Resp 18 | Ht 60.0 in | Wt 131.8 lb

## 2022-03-23 DIAGNOSIS — R7989 Other specified abnormal findings of blood chemistry: Secondary | ICD-10-CM | POA: Diagnosis not present

## 2022-03-23 DIAGNOSIS — N1832 Chronic kidney disease, stage 3b: Secondary | ICD-10-CM

## 2022-03-23 LAB — RENAL FUNCTION PANEL
Albumin: 4.4 g/dL (ref 3.5–5.2)
BUN: 56 mg/dL — ABNORMAL HIGH (ref 6–23)
CO2: 27 mEq/L (ref 19–32)
Calcium: 10 mg/dL (ref 8.4–10.5)
Chloride: 99 mEq/L (ref 96–112)
Creatinine, Ser: 2.28 mg/dL — ABNORMAL HIGH (ref 0.40–1.20)
GFR: 20.78 mL/min — ABNORMAL LOW (ref >60.00)
Glucose, Bld: 93 mg/dL (ref 70–99)
Phosphorus: 4.1 mg/dL (ref 2.3–4.6)
Potassium: 4.4 mEq/L (ref 3.5–5.1)
Sodium: 134 mEq/L — ABNORMAL LOW (ref 135–145)

## 2022-03-23 LAB — T4, FREE: Free T4: 0.85 ng/dL (ref 0.60–1.60)

## 2022-03-23 LAB — TSH: TSH: 4.94 u[IU]/mL (ref 0.35–5.50)

## 2022-03-23 NOTE — Patient Instructions (Signed)
We will get the labs today and send them to the kidney doctor.  ?

## 2022-03-23 NOTE — Progress Notes (Signed)
? ?  Subjective:  ? ?Patient ID: Anita [name], female    DOB: 03/31/1946, 76 y.o.   MRN: [id number] ? ?HPI ?The patient is a 76 YO female coming in for renal and thyroid follow up. ? ?Review of Systems  ?Constitutional:  Positive for fatigue.  ?HENT: Negative.    ?Eyes: Negative.   ?Respiratory:  Negative for cough, chest tightness and shortness of breath.   ?Cardiovascular:  Negative for chest pain, palpitations and leg swelling.  ?Gastrointestinal:  Negative for abdominal distention, abdominal pain, constipation, diarrhea, nausea and vomiting.  ?Musculoskeletal:  Positive for arthralgias.  ?Skin: Negative.   ?Neurological: Negative.   ?Psychiatric/Behavioral: Negative.    ? ?Objective:  ?Physical Exam ?Constitutional:   ?   Appearance: She is well-developed.  ?HENT:  ?   Head: Normocephalic and atraumatic.  ?Cardiovascular:  ?   Rate and Rhythm: Normal rate and regular rhythm.  ?Pulmonary:  ?   Effort: Pulmonary effort is normal. No respiratory distress.  ?   Breath sounds: Normal breath sounds. No wheezing or rales.  ?Abdominal:  ?   General: Bowel sounds are normal. There is no distension.  ?   Palpations: Abdomen is soft.  ?   Tenderness: There is no abdominal tenderness. There is no rebound.  ?Musculoskeletal:     ?   General: Tenderness present.  ?   Cervical back: Normal range of motion.  ?Skin: ?   General: Skin is warm and dry.  ?Neurological:  ?   Mental Status: She is alert and oriented to person, place, and time.  ?   Coordination: Coordination normal.  ? ? ?Vitals:  ? 03/23/22 1428  ?BP: 122/68  ?Pulse: 71  ?Resp: 18  ?SpO2: 98%  ?Weight: 131 lb 12.8 oz (59.8 kg)  ?Height: 5' (1.524 m)  ? ? ?This visit occurred during the SARS-CoV-2 public health emergency.  Safety protocols were in place, including screening questions prior to the visit, additional usage of staff PPE, and extensive cleaning of exam room while observing appropriate contact time as indicated for disinfecting solutions.  ? ?Assessment &  Plan:  ? ?

## 2022-03-24 ENCOUNTER — Encounter: Payer: Self-pay | Admitting: Internal Medicine

## 2022-03-24 NOTE — Assessment & Plan Note (Signed)
Recent renal note with elevated creatinine compared to baseline. BP at goal. Checking renal function to verify where this is at. ?

## 2022-03-24 NOTE — Assessment & Plan Note (Signed)
Checking TSH and free T4. Previously did not tolerate levothyroxine well. Having some fatigue no overt symptoms of low thyroid levels.  ?

## 2022-04-19 ENCOUNTER — Other Ambulatory Visit: Payer: Self-pay

## 2022-04-19 ENCOUNTER — Encounter (HOSPITAL_COMMUNITY): Payer: Self-pay

## 2022-04-19 ENCOUNTER — Emergency Department (HOSPITAL_COMMUNITY)
Admission: EM | Admit: 2022-04-19 | Discharge: 2022-04-19 | Disposition: A | Discharge status: Home / Self Care | Payer: Medicare HMO | Attending: Emergency Medicine | Admitting: Emergency Medicine

## 2022-04-19 DIAGNOSIS — H6993 Unspecified Eustachian tube disorder, bilateral: Secondary | ICD-10-CM | POA: Diagnosis not present

## 2022-04-19 DIAGNOSIS — H9203 Otalgia, bilateral: Secondary | ICD-10-CM | POA: Diagnosis not present

## 2022-04-19 DIAGNOSIS — R0981 Nasal congestion: Secondary | ICD-10-CM | POA: Insufficient documentation

## 2022-04-19 DIAGNOSIS — H6983 Other specified disorders of Eustachian tube, bilateral: Secondary | ICD-10-CM | POA: Diagnosis not present

## 2022-04-19 MED ORDER — PREDNISONE 10 MG PO TABS: 10.0000 mg | ORAL_TABLET | Freq: Every day | ORAL | 0 refills | Status: AC

## 2022-04-19 MED ORDER — FLUTICASONE PROPIONATE 50 MCG/ACT NA SUSP: 1.0000 | Freq: Every day | NASAL | 2 refills | Status: AC

## 2022-04-19 NOTE — ED Provider Notes (Signed)
?[place] ?Provider Note ? ? ?CSN: [id number] ?Arrival date & time: 04/19/22  1018 ? ?  ? ?History ? ?Chief Complaint  ?Patient presents with  ? Ear Pain  ? ? ?Anita [name] is a 76 y.o. female. ? ?76 year old female presents with complaint of pain in both of her ears with concern for ear infection. Ringing in left ear Tuesday, then ear filled up and spread to right ear, and into throat. Mild nasal congestion yesterday.  ? ? ?  ? ?Home Medications ?Prior to Admission medications   ?Medication Sig Start Date End Date Taking? Authorizing Provider  ?fluticasone (FLONASE) 50 MCG/ACT nasal spray Place 1 spray into both nostrils daily. 04/19/22  Yes Tacy Learn, PA-C  ?predniSONE (DELTASONE) 10 MG tablet Take 1 tablet (10 mg total) by mouth daily for 5 days. 04/19/22 04/24/22 Yes Tacy Learn, PA-C  ?calcium-vitamin D (OSCAL WITH D) 250-125 MG-UNIT tablet Take 1 tablet by mouth daily.    [provider]  ?cholecalciferol (VITAMIN D3) 25 MCG (1000 UNIT) tablet Take 1,000 Units by mouth daily.    [provider]  ?FARXIGA 10 MG TABS tablet Take 10 mg by mouth daily. 03/19/22   [provider]  ?HYDROcodone-acetaminophen (NORCO/VICODIN) 5-325 MG tablet Take 1 tablet by mouth 3 (three) times daily as needed for moderate pain. 02/17/22   Mcarthur Rossetti, MD  ?Iron-Vitamin C 100-250 MG TABS Take 1 tablet by mouth daily. 03/13/22   [provider]  ?olmesartan-hydrochlorothiazide (BENICAR HCT) 20-12.5 MG tablet Take 1 tablet by mouth daily. 12/30/21   [provider]  ?patiromer Daryll Drown) 8.4 g packet Take 8.4 g by mouth daily.    [provider]  ?SODIUM BICARBONATE PO Take 10 mg by mouth in the morning and at bedtime.    [provider]  ?   ? ?Allergies    ?Pantoprazole   ? ?Review of Systems   ?Review of Systems ?Negative except as per HPI ?Physical Exam ?Updated Vital Signs ?BP (!) 177/73 (BP Location: Right Arm)    Pulse 87   Temp 98.2 ?F (36.8 ?C) (Oral)   Resp 16   Ht 5' (1.524 m)   SpO2 100%   BMI 25.74 kg/m?  ?Physical Exam ?Vitals and nursing note reviewed.  ?Constitutional:   ?   General: She is not in acute distress. ?   Appearance: She is well-developed. She is not diaphoretic.  ?HENT:  ?   Head: Normocephalic and atraumatic.  ?   Right Ear: Ear canal normal. A middle ear effusion is present. Tympanic membrane is not erythematous, retracted or bulging.  ?   Left Ear: Ear canal normal. A middle ear effusion is present. Tympanic membrane is not erythematous, retracted or bulging.  ?   Nose: Congestion present.  ?   Mouth/Throat:  ?   Mouth: Mucous membranes are moist.  ?Eyes:  ?   Conjunctiva/sclera: Conjunctivae normal.  ?Pulmonary:  ?   Effort: Pulmonary effort is normal.  ?Musculoskeletal:  ?   Cervical back: Neck supple.  ?Lymphadenopathy:  ?   Cervical: No cervical adenopathy.  ?Skin: ?   General: Skin is warm and dry.  ?   Findings: No erythema or rash.  ?Neurological:  ?   Mental Status: She is alert and oriented to person, place, and time.  ?Psychiatric:     ?   Behavior: Behavior normal.  ? ? ?ED Results / Procedures / Treatments   ?Labs ?(all  labs ordered are listed, but only abnormal results are displayed) ?Labs Reviewed - No data to display ? ?EKG ?None ? ?Radiology ?No results found. ? ?Procedures ?Procedures  ? ? ?Medications Ordered in ED ?Medications - No data to display ? ?ED Course/ Medical Decision Making/ A&P ?  ?                        ?Medical Decision Making ?Risk ?Prescription drug management. ? ? ?76 year old female with complaint of bilateral ear pain as above.  On exam, found to have trace bilateral effusions and nasal congestion.  Plan is to treat this with short course of prednisone and Flonase, also use Zyrtec.  Referred to ENT or to see her PCP if symptoms are not improving with above treatment. ? ? ? ? ? ? ? ?Final Clinical Impression(s) / ED Diagnoses ?Final diagnoses:  ?Otalgia of  both ears  ?Dysfunction of both eustachian tubes  ? ? ?Rx / DC Orders ?ED Discharge Orders   ? ?      Ordered  ?  predniSONE (DELTASONE) 10 MG tablet  Daily       ? 04/19/22 1042  ?  fluticasone (FLONASE) 50 MCG/ACT nasal spray  Daily       ? 04/19/22 1042  ? ?  ?  ? ?  ? ? ?  ?Tacy Learn, PA-C ?04/19/22 1415 ? ?  ?Sherwood Gambler, MD ?04/21/22 1456 ? ?

## 2022-04-19 NOTE — ED Triage Notes (Signed)
Pt reports bilateral ear pain for the past week, states she thinks they are infected.  ?

## 2022-04-19 NOTE — Discharge Instructions (Addendum)
Take Prednisone daily as prescribed. ?Take Flonase daily. Can also add Zyrtec daily. ?Recheck with your doctor or see ENT if symptoms continue.  ?

## 2022-04-19 NOTE — ED Provider Triage Note (Signed)
Emergency Medicine Provider Triage Evaluation Note ? ?Anita [name] , a 76 y.o. female  was evaluated in triage.  Pt complains of bilateral ear pain. Ringing in left ear Tuesday, then ear filled up and spread to right ear, and into throat. Mild nasal congestion yesterday.  ? ?Review of Systems  ?Positive: Ear pain ?Negative: Dizziness, shortness of breath, fever ? ?Physical Exam  ?There were no vitals taken for this visit. ?Gen:   Awake, no distress   ?Resp:  Normal effort  ?MSK:   Moves extremities without difficulty  ?Other:   ? ?Medical Decision Making  ?Medically screening exam initiated at 10:38 AM.  Appropriate orders placed.  Anita [name] was informed that the remainder of the evaluation will be completed by another provider, this initial triage assessment does not replace that evaluation, and the importance of remaining in the ED until their evaluation is complete. ? ? ?  ?Tacy Learn, PA-C ?04/19/22 1416 ? ?

## 2022-04-20 ENCOUNTER — Ambulatory Visit (INDEPENDENT_AMBULATORY_CARE_PROVIDER_SITE_OTHER): Payer: Medicare HMO | Admitting: Cardiovascular Disease

## 2022-04-20 ENCOUNTER — Encounter: Payer: Self-pay | Admitting: Cardiovascular Disease

## 2022-04-20 ENCOUNTER — Other Ambulatory Visit: Payer: Self-pay | Admitting: *Deleted

## 2022-04-20 DIAGNOSIS — E782 Mixed hyperlipidemia: Secondary | ICD-10-CM | POA: Diagnosis not present

## 2022-04-20 DIAGNOSIS — Z720 Tobacco use: Secondary | ICD-10-CM | POA: Diagnosis not present

## 2022-04-20 DIAGNOSIS — I739 Peripheral vascular disease, unspecified: Secondary | ICD-10-CM

## 2022-04-20 DIAGNOSIS — I10 Essential (primary) hypertension: Secondary | ICD-10-CM | POA: Diagnosis not present

## 2022-04-20 DIAGNOSIS — R6889 Other general symptoms and signs: Secondary | ICD-10-CM | POA: Diagnosis not present

## 2022-04-20 LAB — LIPID PANEL
Chol/HDL Ratio: 2.4 ratio (ref 0.0–4.4)
Cholesterol, Total: 180 mg/dL (ref 100–199)
HDL: 74 mg/dL (ref >39)
LDL Chol Calc (NIH): 89 mg/dL (ref 0–99)
Triglycerides: 97 mg/dL (ref 0–149)
VLDL Cholesterol Cal: 17 mg/dL (ref 5–40)

## 2022-04-20 LAB — HEPATIC FUNCTION PANEL
ALT: 13 IU/L (ref 0–32)
AST: 20 IU/L (ref 0–40)
Albumin: 4.6 g/dL (ref 3.7–4.7)
Alkaline Phosphatase: 66 IU/L (ref 44–121)
Bilirubin Total: <0.2 mg/dL (ref 0.0–1.2)
Bilirubin, Direct: <0.1 mg/dL (ref 0.00–0.40)
Total Protein: 7.8 g/dL (ref 6.0–8.5)

## 2022-04-20 NOTE — Assessment & Plan Note (Signed)
History of claudication followed by Dr. Trula Slade ?

## 2022-04-20 NOTE — Assessment & Plan Note (Signed)
History of hyperlipidemia on statin therapy in the past unfortunately stopped by her PCP with lipid profile performed 05/13/2021 revealing total cholesterol 150, LDL 60 and HDL of 65.  We will recheck a lipid liver profile. ?

## 2022-04-20 NOTE — Patient Instructions (Signed)

## 2022-04-20 NOTE — Assessment & Plan Note (Signed)
History of essential hypertension a blood pressure measured today at 138/80.  She is on Benicar and hydrochlorothiazide. ?

## 2022-04-20 NOTE — Progress Notes (Signed)
? ? ? ?04/20/2022 ?Rance Muir   ?07-22-1946  ?[id number] ? ?Primary Physician [name], MD ?Primary Cardiologist: [name] MD Lupe Carney, [place] ? ?HPI:  HELI DINO is a 76 y.o.  mildly overweight divorced African-American female mother of 2 children, grandmother of 3 grandchildren referred by Dr. Sharlet Salina, her PCP, for cardiovascular valuation because of risk factors.  I last saw her in the office 5//21. She is retired from working in accounts payable.  Risk factors include tobacco abuse having smoked 50 pack years currently smoking 1/2 pack/day recalcitrant to respect modification.  She does have treated hypertension as well as family history with a mother who had a stent.  She is never had a heart attack or stroke.  She denies chest pain or shortness of breath.  She does have bilateral calf claudication with abnormal Doppler studies performed 07/11/2018, followed by Dr. Trula Slade.  Her most recent lipid profile performed 11/28/2019 revealed total cholesterol 198, LDL 102 and HDL 72. ?  ?Since I saw her 2 years ago, she has done well.  She denies chest pain, shortness of breath or claudication.  She does have reflux type symptoms.  She did have a 2D echocardiogram performed 12/26/2019 that showed aortic sclerosis without stenosis . ? ?Current Meds  ?Medication Sig  ? calcium-vitamin D (OSCAL WITH D) 250-125 MG-UNIT tablet Take 1 tablet by mouth daily.  ? cholecalciferol (VITAMIN D3) 25 MCG (1000 UNIT) tablet Take 1,000 Units by mouth daily.  ? FARXIGA 10 MG TABS tablet Take 10 mg by mouth daily.  ? fluticasone (FLONASE) 50 MCG/ACT nasal spray Place 1 spray into both nostrils daily.  ? HYDROcodone-acetaminophen (NORCO/VICODIN) 5-325 MG tablet Take 1 tablet by mouth 3 (three) times daily as needed for moderate pain.  ? Iron-Vitamin C 100-250 MG TABS Take 1 tablet by mouth daily.  ? olmesartan-hydrochlorothiazide (BENICAR HCT) 20-12.5 MG tablet Take 1 tablet by mouth daily.  ? patiromer  (VELTASSA) 8.4 g packet Take 8.4 g by mouth daily.  ? predniSONE (DELTASONE) 10 MG tablet Take 1 tablet (10 mg total) by mouth daily for 5 days.  ? SODIUM BICARBONATE PO Take 10 mg by mouth in the morning and at bedtime.  ?  ? ?Allergies  ?Allergen Reactions  ? Pantoprazole Diarrhea  ? ? ?Social History  ? ?Socioeconomic History  ? Marital status: Single  ?  Spouse name: Not on file  ? Number of children: 2  ? Years of education: 12+  ? Highest education level: Not on file  ?Occupational History  ? Occupation: Retired  ?Tobacco Use  ? Smoking status: Every Day  ?  Packs/day: 0.50  ?  Years: 59.00  ?  Pack years: 29.50  ?  Types: Cigarettes  ? Smokeless tobacco: Never  ? Tobacco comments:  ?  since age 31  ?Vaping Use  ? Vaping Use: Never used  ?Substance and Sexual Activity  ? Alcohol use: Not Currently  ?  Alcohol/week: 0.0 standard drinks  ?  Comment: socially  ? Drug use: Never  ? Sexual activity: Not Currently  ?  Birth control/protection: Post-menopausal, Surgical  ?Other Topics Concern  ? Not on file  ?Social History Narrative  ? Lives at home with her mother.  ? Right-handed.  ? Several sodas per day.  ? ?Social Determinants of Health  ? ?Financial Resource Strain: Not on file  ?Food Insecurity: Not on file  ?Transportation Needs: Not on file  ?Physical Activity: Not on file  ?  Stress: Not on file  ?Social Connections: Not on file  ?Intimate Partner Violence: Not on file  ?  ? ?Review of Systems: ?General: negative for chills, fever, night sweats or weight changes.  ?Cardiovascular: negative for chest pain, dyspnea on exertion, edema, orthopnea, palpitations, paroxysmal nocturnal dyspnea or shortness of breath ?Dermatological: negative for rash ?Respiratory: negative for cough or wheezing ?Urologic: negative for hematuria ?Abdominal: negative for nausea, vomiting, diarrhea, bright red blood per rectum, melena, or hematemesis ?Neurologic: negative for visual changes, syncope, or dizziness ?All other systems  reviewed and are otherwise negative except as noted above. ? ? ? ?Blood pressure 138/80, pulse 76, height 5' (1.524 m), weight 128 lb 12.8 oz (58.4 kg), SpO2 96 %.  ?General appearance: alert and no distress ?Neck: no adenopathy, no carotid bruit, no JVD, supple, symmetrical, trachea midline, and thyroid not enlarged, symmetric, no tenderness/mass/nodules ?Lungs: clear to auscultation bilaterally ?Heart: regular rate and rhythm, S1, S2 normal, no murmur, click, rub or gallop ?Extremities: extremities normal, atraumatic, no cyanosis or edema ?Pulses: 2+ and symmetric ?Skin: Skin color, texture, turgor normal. No rashes or lesions ?Neurologic: Grossly normal ? ?EKG sinus rhythm at 76 with borderline voltage for LVH and nonspecific ST and T wave changes.  I personally reviewed this EKG. ? ?ASSESSMENT AND PLAN:  ? ?Claudication [facility]) ?History of claudication followed by Dr. Trula Slade ? ?Hypertension ?History of essential hypertension a blood pressure measured today at 138/80.  She is on Benicar and hydrochlorothiazide. ? ?Hyperlipidemia ?History of hyperlipidemia on statin therapy in the past unfortunately stopped by her PCP with lipid profile performed 05/13/2021 revealing total cholesterol 150, LDL 60 and HDL of 65.  We will recheck a lipid liver profile. ? ?Tobacco abuse ?Ongoing tobacco abuse of 1/2 pack/day recalcitrant to risk factor modification. ? ? ? ? ?[name] MD FACP,FACC,FAHA, FSCAI ?04/20/2022 ?8:42 AM ?

## 2022-04-20 NOTE — Assessment & Plan Note (Signed)
Ongoing tobacco abuse of 1/2 pack/day recalcitrant to risk factor modification. 

## 2022-04-21 ENCOUNTER — Telehealth: Payer: Self-pay | Admitting: *Deleted

## 2022-04-21 DIAGNOSIS — E782 Mixed hyperlipidemia: Secondary | ICD-10-CM

## 2022-04-21 MED ORDER — ROSUVASTATIN CALCIUM 20 MG PO TABS: 20.0000 mg | ORAL_TABLET | Freq: Every day | ORAL | 3 refills | Status: AC

## 2022-04-21 NOTE — Telephone Encounter (Signed)
pt aware of results, she reports having cramps from lipitor in the past. ? She will try crestor 20 mg once daily,New script sent to the pharmacy  ?Lab orders mailed to the pt  ? ? ?

## 2022-04-21 NOTE — Telephone Encounter (Signed)
-----   Message from [name], MD sent at 04/21/2022  6:39 AM EDT ----- ?LDL has increased from 57--->89 since stopping statin. Restart previous statin at previous dose and re check FLP 3 months ?

## 2022-04-23 DIAGNOSIS — N1832 Chronic kidney disease, stage 3b: Secondary | ICD-10-CM | POA: Diagnosis not present

## 2022-04-23 DIAGNOSIS — R6889 Other general symptoms and signs: Secondary | ICD-10-CM | POA: Diagnosis not present

## 2022-04-27 DIAGNOSIS — E875 Hyperkalemia: Secondary | ICD-10-CM | POA: Diagnosis not present

## 2022-04-27 DIAGNOSIS — E785 Hyperlipidemia, unspecified: Secondary | ICD-10-CM | POA: Diagnosis not present

## 2022-04-27 DIAGNOSIS — F17200 Nicotine dependence, unspecified, uncomplicated: Secondary | ICD-10-CM | POA: Diagnosis not present

## 2022-04-27 DIAGNOSIS — D631 Anemia in chronic kidney disease: Secondary | ICD-10-CM | POA: Diagnosis not present

## 2022-04-27 DIAGNOSIS — E213 Hyperparathyroidism, unspecified: Secondary | ICD-10-CM | POA: Diagnosis not present

## 2022-04-27 DIAGNOSIS — N1832 Chronic kidney disease, stage 3b: Secondary | ICD-10-CM | POA: Diagnosis not present

## 2022-04-27 DIAGNOSIS — R6889 Other general symptoms and signs: Secondary | ICD-10-CM | POA: Diagnosis not present

## 2022-04-27 DIAGNOSIS — K219 Gastro-esophageal reflux disease without esophagitis: Secondary | ICD-10-CM | POA: Diagnosis not present

## 2022-04-27 DIAGNOSIS — E872 Acidosis, unspecified: Secondary | ICD-10-CM | POA: Diagnosis not present

## 2022-04-27 DIAGNOSIS — I129 Hypertensive chronic kidney disease with stage 1 through stage 4 chronic kidney disease, or unspecified chronic kidney disease: Secondary | ICD-10-CM | POA: Diagnosis not present

## 2022-05-06 DIAGNOSIS — R6889 Other general symptoms and signs: Secondary | ICD-10-CM | POA: Diagnosis not present

## 2022-05-06 DIAGNOSIS — H9203 Otalgia, bilateral: Secondary | ICD-10-CM | POA: Diagnosis not present

## 2022-05-27 ENCOUNTER — Encounter: Payer: Self-pay | Admitting: Cardiovascular Disease

## 2022-06-10 ENCOUNTER — Encounter: Payer: Self-pay | Admitting: Cardiovascular Disease

## 2022-06-18 ENCOUNTER — Telehealth: Payer: Self-pay

## 2022-06-18 NOTE — Telephone Encounter (Signed)
Transition Care Management Unsuccessful Follow-up Telephone Call  Date of discharge and from where:  06/16/22 [facility]  Attempts:  1st Attempt  Reason for unsuccessful TCM follow-up call:  Unable to leave message.  Johnney Killian, RN, BSN, CCM Care Management Coordinator Phone: [phone]: [phone]

## 2022-06-24 ENCOUNTER — Encounter: Payer: Self-pay | Admitting: Cardiovascular Disease

## 2022-06-30 ENCOUNTER — Other Ambulatory Visit: Payer: Self-pay | Admitting: *Deleted

## 2022-06-30 NOTE — Patient Outreach (Signed)
  Care Coordination [facility] Note Transition Care Management Unsuccessful Follow-up Telephone Call  Date of discharge and from where:  [id number] [facility]  Attempts:  1st Attempt  Reason for unsuccessful TCM follow-up call:  Left voice message  [place] Care Management [phone]

## 2022-07-01 ENCOUNTER — Other Ambulatory Visit: Payer: Self-pay | Admitting: *Deleted

## 2022-07-01 NOTE — Patient Outreach (Signed)
  Care Coordination [facility] Note Transition Care Management Unsuccessful Follow-up Telephone Call  Date of discharge and from where:  [phone] from [facility]  Attempts:  2nd Attempt  Reason for unsuccessful TCM follow-up call:  Left voice message   [place] Care Management [phone]

## 2022-07-02 ENCOUNTER — Ambulatory Visit: Payer: Self-pay | Admitting: *Deleted

## 2022-07-02 NOTE — Patient Outreach (Signed)
  Care Coordination [facility] Note Transition Care Management Unsuccessful Follow-up Telephone Call  Date of discharge and from where:  7/11/2 FROM [place]  Attempts:  3rd Attempt  Reason for unsuccessful TCM follow-up call:  Left voice message  Hubert Azure RN, MSN [place] [phone] [email or web address]

## 2022-08-06 ENCOUNTER — Encounter: Payer: Self-pay | Admitting: Cardiovascular Disease

## 2022-08-09 NOTE — Telephone Encounter (Signed)
Discussed Dr. Kennon Holter reply with patient.Marland KitchenMarland Kitchen"Not sure these side effects can be attributed to her statin. Have her take a 4 week statin holiday and if symptoms resolve we can find an alternative therapy."  Patient verbalized understanding.

## 2022-09-08 ENCOUNTER — Telehealth: Payer: Self-pay

## 2022-09-08 NOTE — Telephone Encounter (Signed)
Chart opened in error

## 2022-09-11 ENCOUNTER — Other Ambulatory Visit: Payer: Self-pay | Admitting: Internal Medicine

## 2022-11-29 ENCOUNTER — Telehealth: Payer: Self-pay

## 2022-11-29 NOTE — Telephone Encounter (Signed)
Per pt she no longer follows Dr Sharlet Salina- opened in error

## 2022-12-23 ENCOUNTER — Encounter (HOSPITAL_COMMUNITY): Payer: Medicare HMO

## 2023-01-18 ENCOUNTER — Telehealth: Payer: Self-pay | Admitting: Internal Medicine

## 2023-01-18 NOTE — Telephone Encounter (Signed)
Left message for patient to call back and schedule Medicare Annual Wellness Visit (AWV) in office.   Please offer to do virtually or by telephone.   Last AWV 05/13/2021   Please schedule at any time with [facility].  30 minute appointment  Any questions, please contact me at [phone]   Thank you,   [place] [facility] [place] [facility] ??[phone] or ??[phone]

## 2023-02-14 ENCOUNTER — Encounter: Payer: Self-pay | Admitting: Cardiovascular Disease

## 2023-03-27 ENCOUNTER — Encounter: Payer: Self-pay | Admitting: Cardiovascular Disease
